# Patient Record
Sex: Male | Born: 1940 | Race: Asian | Hispanic: No | Marital: Married | Smoking: Former smoker
Health system: Southern US, Community
[De-identification: ages and names within clinical notes are randomized; demographics above are authoritative.]

## PROBLEM LIST (undated history)

## (undated) DIAGNOSIS — I447 Left bundle-branch block, unspecified: Secondary | ICD-10-CM

## (undated) DIAGNOSIS — I209 Angina pectoris, unspecified: Secondary | ICD-10-CM

## (undated) DIAGNOSIS — I10 Essential (primary) hypertension: Secondary | ICD-10-CM

## (undated) DIAGNOSIS — R0602 Shortness of breath: Secondary | ICD-10-CM

## (undated) DIAGNOSIS — Z95810 Presence of automatic (implantable) cardiac defibrillator: Secondary | ICD-10-CM

## (undated) DIAGNOSIS — I2109 ST elevation (STEMI) myocardial infarction involving other coronary artery of anterior wall: Secondary | ICD-10-CM

## (undated) DIAGNOSIS — I639 Cerebral infarction, unspecified: Secondary | ICD-10-CM

## (undated) DIAGNOSIS — G4730 Sleep apnea, unspecified: Secondary | ICD-10-CM

## (undated) DIAGNOSIS — Z950 Presence of cardiac pacemaker: Secondary | ICD-10-CM

## (undated) DIAGNOSIS — C801 Malignant (primary) neoplasm, unspecified: Secondary | ICD-10-CM

## (undated) DIAGNOSIS — J449 Chronic obstructive pulmonary disease, unspecified: Secondary | ICD-10-CM

## (undated) DIAGNOSIS — I255 Ischemic cardiomyopathy: Secondary | ICD-10-CM

## (undated) DIAGNOSIS — Z66 Do not resuscitate: Secondary | ICD-10-CM

## (undated) DIAGNOSIS — R51 Headache: Secondary | ICD-10-CM

## (undated) HISTORY — PX: INSERT / REPLACE / REMOVE PACEMAKER: SUR710

---

## 1998-02-10 ENCOUNTER — Encounter: Admission: RE | Admit: 1998-02-10 | Discharge: 1998-02-10 | Payer: Self-pay | Admitting: Hematology and Oncology

## 1998-03-08 ENCOUNTER — Encounter: Admission: RE | Admit: 1998-03-08 | Discharge: 1998-03-08 | Payer: Self-pay | Admitting: Internal Medicine

## 1998-03-22 ENCOUNTER — Emergency Department (HOSPITAL_COMMUNITY): Admission: EM | Admit: 1998-03-22 | Discharge: 1998-03-22 | Payer: Self-pay | Admitting: Emergency Medicine

## 1998-03-23 ENCOUNTER — Encounter: Admission: RE | Admit: 1998-03-23 | Discharge: 1998-03-23 | Payer: Self-pay | Admitting: Internal Medicine

## 2001-05-15 ENCOUNTER — Inpatient Hospital Stay (HOSPITAL_COMMUNITY): Admission: EM | Admit: 2001-05-15 | Discharge: 2001-05-21 | Payer: Self-pay | Admitting: *Deleted

## 2001-05-18 ENCOUNTER — Encounter: Payer: Self-pay | Admitting: Internal Medicine

## 2001-05-26 ENCOUNTER — Encounter: Admission: RE | Admit: 2001-05-26 | Discharge: 2001-05-26 | Payer: Self-pay | Admitting: Family Medicine

## 2001-06-01 ENCOUNTER — Encounter: Admission: RE | Admit: 2001-06-01 | Discharge: 2001-06-01 | Payer: Self-pay | Admitting: Family Medicine

## 2001-06-05 ENCOUNTER — Encounter: Admission: RE | Admit: 2001-06-05 | Discharge: 2001-06-05 | Payer: Self-pay | Admitting: Family Medicine

## 2001-06-12 ENCOUNTER — Encounter: Admission: RE | Admit: 2001-06-12 | Discharge: 2001-06-12 | Payer: Self-pay | Admitting: Family Medicine

## 2003-07-15 ENCOUNTER — Observation Stay (HOSPITAL_COMMUNITY): Admission: EM | Admit: 2003-07-15 | Discharge: 2003-07-16 | Payer: Self-pay | Admitting: Emergency Medicine

## 2006-04-29 ENCOUNTER — Encounter: Payer: Self-pay | Admitting: Emergency Medicine

## 2006-04-29 ENCOUNTER — Inpatient Hospital Stay (HOSPITAL_COMMUNITY): Admission: RE | Admit: 2006-04-29 | Discharge: 2006-05-01 | Payer: Self-pay | Admitting: Neurology

## 2006-04-30 ENCOUNTER — Ambulatory Visit: Payer: Self-pay | Admitting: Physical Medicine & Rehabilitation

## 2006-05-01 ENCOUNTER — Inpatient Hospital Stay (HOSPITAL_COMMUNITY)
Admission: RE | Admit: 2006-05-01 | Discharge: 2006-05-08 | Payer: Self-pay | Admitting: Physical Medicine & Rehabilitation

## 2006-06-09 ENCOUNTER — Encounter
Admission: RE | Admit: 2006-06-09 | Discharge: 2006-09-07 | Payer: Self-pay | Admitting: Physical Medicine & Rehabilitation

## 2006-07-02 ENCOUNTER — Ambulatory Visit: Payer: Self-pay | Admitting: Internal Medicine

## 2006-07-02 LAB — CONVERTED CEMR LAB
Calcium: 9.3 mg/dL (ref 8.4–10.5)
Glucose, Bld: 102 mg/dL — ABNORMAL HIGH (ref 70–99)
Potassium: 4.2 meq/L (ref 3.5–5.3)

## 2006-08-12 ENCOUNTER — Encounter: Payer: Self-pay | Admitting: Licensed Clinical Social Worker

## 2006-09-25 ENCOUNTER — Encounter (INDEPENDENT_AMBULATORY_CARE_PROVIDER_SITE_OTHER): Payer: Self-pay | Admitting: *Deleted

## 2006-09-25 ENCOUNTER — Ambulatory Visit: Payer: Self-pay | Admitting: Internal Medicine

## 2006-09-25 DIAGNOSIS — G8190 Hemiplegia, unspecified affecting unspecified side: Secondary | ICD-10-CM | POA: Insufficient documentation

## 2006-09-25 DIAGNOSIS — R630 Anorexia: Secondary | ICD-10-CM

## 2006-09-25 DIAGNOSIS — E785 Hyperlipidemia, unspecified: Secondary | ICD-10-CM | POA: Insufficient documentation

## 2006-09-25 DIAGNOSIS — I679 Cerebrovascular disease, unspecified: Secondary | ICD-10-CM

## 2006-09-25 DIAGNOSIS — I10 Essential (primary) hypertension: Secondary | ICD-10-CM | POA: Insufficient documentation

## 2006-09-26 LAB — CONVERTED CEMR LAB
Albumin: 4.4 g/dL (ref 3.5–5.2)
BUN: 22 mg/dL (ref 6–23)
Chloride: 107 meq/L (ref 96–112)
Glucose, Bld: 109 mg/dL — ABNORMAL HIGH (ref 70–99)
Potassium: 4.2 meq/L (ref 3.5–5.3)
Sodium: 142 meq/L (ref 135–145)
Total Bilirubin: 0.3 mg/dL (ref 0.3–1.2)
Total Protein: 7.6 g/dL (ref 6.0–8.3)
Triglycerides: 207 mg/dL — ABNORMAL HIGH (ref <150)

## 2006-10-28 ENCOUNTER — Inpatient Hospital Stay (HOSPITAL_COMMUNITY): Admission: AD | Admit: 2006-10-28 | Discharge: 2006-10-30 | Payer: Self-pay | Admitting: Internal Medicine

## 2006-10-28 ENCOUNTER — Ambulatory Visit: Payer: Self-pay | Admitting: Cardiology

## 2006-10-28 ENCOUNTER — Ambulatory Visit: Payer: Self-pay | Admitting: Internal Medicine

## 2006-10-29 ENCOUNTER — Encounter: Payer: Self-pay | Admitting: Cardiology

## 2006-10-30 ENCOUNTER — Encounter (INDEPENDENT_AMBULATORY_CARE_PROVIDER_SITE_OTHER): Payer: Self-pay | Admitting: Ophthalmology

## 2006-10-30 DIAGNOSIS — R0789 Other chest pain: Secondary | ICD-10-CM | POA: Insufficient documentation

## 2007-07-23 DIAGNOSIS — I639 Cerebral infarction, unspecified: Secondary | ICD-10-CM

## 2007-07-23 HISTORY — DX: Cerebral infarction, unspecified: I63.9

## 2007-11-02 ENCOUNTER — Telehealth (INDEPENDENT_AMBULATORY_CARE_PROVIDER_SITE_OTHER): Payer: Self-pay | Admitting: *Deleted

## 2007-12-05 ENCOUNTER — Emergency Department (HOSPITAL_COMMUNITY): Admission: EM | Admit: 2007-12-05 | Discharge: 2007-12-05 | Payer: Self-pay | Admitting: Emergency Medicine

## 2009-07-22 DIAGNOSIS — I2109 ST elevation (STEMI) myocardial infarction involving other coronary artery of anterior wall: Secondary | ICD-10-CM

## 2009-07-22 HISTORY — DX: ST elevation (STEMI) myocardial infarction involving other coronary artery of anterior wall: I21.09

## 2010-02-12 ENCOUNTER — Inpatient Hospital Stay (HOSPITAL_COMMUNITY): Admission: EM | Admit: 2010-02-12 | Discharge: 2010-02-15 | Payer: Self-pay | Admitting: Emergency Medicine

## 2010-02-12 ENCOUNTER — Emergency Department (HOSPITAL_COMMUNITY): Admission: EM | Admit: 2010-02-12 | Discharge: 2010-02-12 | Payer: Self-pay | Admitting: Emergency Medicine

## 2010-02-12 HISTORY — PX: CORONARY ANGIOPLASTY WITH STENT PLACEMENT: SHX49

## 2010-02-13 ENCOUNTER — Encounter (INDEPENDENT_AMBULATORY_CARE_PROVIDER_SITE_OTHER): Payer: Self-pay | Admitting: Cardiovascular Disease

## 2010-03-22 DIAGNOSIS — I255 Ischemic cardiomyopathy: Secondary | ICD-10-CM

## 2010-03-22 HISTORY — DX: Ischemic cardiomyopathy: I25.5

## 2010-10-06 LAB — DIFFERENTIAL
Basophils Absolute: 0 10*3/uL (ref 0.0–0.1)
Basophils Absolute: 0 10*3/uL (ref 0.0–0.1)
Basophils Relative: 0 % (ref 0–1)
Eosinophils Absolute: 0.1 10*3/uL (ref 0.0–0.7)
Eosinophils Absolute: 0.1 10*3/uL (ref 0.0–0.7)
Eosinophils Relative: 2 % (ref 0–5)
Lymphocytes Relative: 28 % (ref 12–46)
Lymphs Abs: 1.5 10*3/uL (ref 0.7–4.0)
Monocytes Relative: 10 % (ref 3–12)
Monocytes Relative: 8 % (ref 3–12)
Neutrophils Relative %: 61 % (ref 43–77)
Neutrophils Relative %: 62 % (ref 43–77)

## 2010-10-06 LAB — COMPREHENSIVE METABOLIC PANEL
AST: 30 U/L (ref 0–37)
BUN: 17 mg/dL (ref 6–23)
CO2: 23 mEq/L (ref 19–32)
Calcium: 8.5 mg/dL (ref 8.4–10.5)
GFR calc Af Amer: >60 mL/min (ref >60)
Glucose, Bld: 82 mg/dL (ref 70–99)
Sodium: 136 mEq/L (ref 135–145)
Total Bilirubin: 0.5 mg/dL (ref 0.3–1.2)
Total Protein: 7.1 g/dL (ref 6.0–8.3)

## 2010-10-06 LAB — BRAIN NATRIURETIC PEPTIDE: Pro B Natriuretic peptide (BNP): 1155 pg/mL — ABNORMAL HIGH (ref 0.0–100.0)

## 2010-10-06 LAB — HEMOGLOBIN A1C: Mean Plasma Glucose: 114 mg/dL (ref <117)

## 2010-10-06 LAB — POCT I-STAT, CHEM 8
BUN: 21 mg/dL (ref 6–23)
Chloride: 109 mEq/L (ref 96–112)
Creatinine, Ser: 1.1 mg/dL (ref 0.4–1.5)
Glucose, Bld: 79 mg/dL (ref 70–99)
HCT: 36 % — ABNORMAL LOW (ref 39.0–52.0)
Sodium: 144 mEq/L (ref 135–145)
TCO2: 24 mmol/L (ref 0–100)

## 2010-10-06 LAB — CBC
HCT: 31.6 % — ABNORMAL LOW (ref 39.0–52.0)
HCT: 34 % — ABNORMAL LOW (ref 39.0–52.0)
HCT: 37.5 % — ABNORMAL LOW (ref 39.0–52.0)
Hemoglobin: 10.6 g/dL — ABNORMAL LOW (ref 13.0–17.0)
Hemoglobin: 11.4 g/dL — ABNORMAL LOW (ref 13.0–17.0)
Hemoglobin: 11.7 g/dL — ABNORMAL LOW (ref 13.0–17.0)
MCH: 29.7 pg (ref 26.0–34.0)
MCHC: 33 g/dL (ref 30.0–36.0)
MCHC: 33.6 g/dL (ref 30.0–36.0)
MCV: 89.2 fL (ref 78.0–100.0)
Platelets: 161 10*3/uL (ref 150–400)
Platelets: 169 10*3/uL (ref 150–400)
RBC: 4.03 MIL/uL — ABNORMAL LOW (ref 4.22–5.81)
RBC: 4.2 MIL/uL — ABNORMAL LOW (ref 4.22–5.81)
RDW: 14.3 % (ref 11.5–15.5)
RDW: 14.5 % (ref 11.5–15.5)
WBC: 4.4 10*3/uL (ref 4.0–10.5)
WBC: 5.6 10*3/uL (ref 4.0–10.5)
WBC: 6 10*3/uL (ref 4.0–10.5)
WBC: 7.3 10*3/uL (ref 4.0–10.5)

## 2010-10-06 LAB — MAGNESIUM: Magnesium: 1.9 mg/dL (ref 1.5–2.5)

## 2010-10-06 LAB — BASIC METABOLIC PANEL
BUN: 21 mg/dL (ref 6–23)
BUN: 30 mg/dL — ABNORMAL HIGH (ref 6–23)
CO2: 22 mEq/L (ref 19–32)
CO2: 25 mEq/L (ref 19–32)
Calcium: 9 mg/dL (ref 8.4–10.5)
Chloride: 103 mEq/L (ref 96–112)
Chloride: 108 mEq/L (ref 96–112)
Creatinine, Ser: 0.83 mg/dL (ref 0.4–1.5)
Creatinine, Ser: 1.07 mg/dL (ref 0.4–1.5)
Creatinine, Ser: 1.21 mg/dL (ref 0.4–1.5)
GFR calc Af Amer: >60 mL/min (ref >60)
GFR calc Af Amer: >60 mL/min (ref >60)
GFR calc non Af Amer: 58 mL/min — ABNORMAL LOW (ref >60)
GFR calc non Af Amer: >60 mL/min (ref >60)
Glucose, Bld: 83 mg/dL (ref 70–99)
Potassium: 3.6 mEq/L (ref 3.5–5.1)
Potassium: 3.9 mEq/L (ref 3.5–5.1)
Sodium: 140 mEq/L (ref 135–145)

## 2010-10-06 LAB — CARDIAC PANEL(CRET KIN+CKTOT+MB+TROPI)
Relative Index: 3.6 — ABNORMAL HIGH (ref 0.0–2.5)
Total CK: 195 U/L (ref 7–232)
Total CK: 201 U/L (ref 7–232)
Troponin I: 1.75 ng/mL (ref 0.00–0.06)
Troponin I: 2.13 ng/mL (ref 0.00–0.06)

## 2010-10-06 LAB — URINALYSIS, MICROSCOPIC ONLY
Bilirubin Urine: NEGATIVE
Glucose, UA: NEGATIVE mg/dL
Hgb urine dipstick: NEGATIVE
Ketones, ur: NEGATIVE mg/dL
Leukocytes, UA: NEGATIVE
pH: 6 (ref 5.0–8.0)

## 2010-10-06 LAB — LIPID PANEL
HDL: 57 mg/dL (ref >39)
Triglycerides: 80 mg/dL (ref <150)
VLDL: 16 mg/dL (ref 0–40)

## 2010-10-06 LAB — PROTIME-INR: Prothrombin Time: 15 seconds (ref 11.6–15.2)

## 2010-10-06 LAB — MRSA PCR SCREENING: MRSA by PCR: NEGATIVE

## 2010-12-07 NOTE — Discharge Summary (Signed)
NAMEBUFFORD, HELMS NO.:  [id number]   MEDICAL RECORD NO.:  [id number]          PATIENT TYPE:  INP   LOCATION:  2038                         FACILITY:  [facility]   PHYSICIAN:  Antony Contras, M.D.     DATE OF BIRTH:  October 01, 1940   DATE OF ADMISSION:  10/28/2006  DATE OF DISCHARGE:  10/30/2006                               DISCHARGE SUMMARY   DISCHARGE DIAGNOSES:  1. Noncardiac chest pain, status post 2D echocardiogram and Myoview.  2. Dyspnea on exertion, questioned if related to chronic obstructive      pulmonary disease.  3. Hypertension.  4. Chronic tobacco abuse.  5. Dyslipidemia.  6. 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   DISCHARGE MEDICATIONS:  1. Zocor 40 mg p.o. daily.  2. Coreg 6.25 mg p.o. b.i.d.  3. Prinivil 20 mg p.o. daily.  4. Aspirin 325 mg p.o. daily.   DISPOSITION AND FOLLOWUP:  Mr. Linhardt is being discharged from [facility] in stable condition.  His chest pain has resolved at this  point.  He is to followup with his primary care physician in outpatient  clinic; Ysidro Evert is making this appointment for him as I am doing this  dictation.  At approximately 5 weeks the patient should have a repeat  fasting lipid panel performed as well as LFTs, as his dose of his statin  was increased 2 days ago during this hospitalization.  Also for his  primary care physician to consider adding possibly long acting beta-  agonist or anticholinergic for probable COPD from chronic tobacco abuse.   PROCEDURES PERFORMED:  Chest x-ray revealed borderline cardiomegaly with  emphysema.  No infiltrates.  There is a right upper lobe granuloma.  There was pleural parenchymal changes in the left medial apex suggestive  of scarring.  Recommend followup x-ray in 6-12 weeks to ensure that  these remain stable.   2D echocardiogram revealed overall normal left ventricular systolic  function with an EF estimated to be 60%.  Normal aortic valve and normal  mitral  valve.   Adenosine Myoview revealed no fixed or reversible defects.  Normal left  ventricular wall motion.  EF was estimated to be approximately 51%.   CONSULTATIONS:  Dr. Olga Millers, Cardiology.   BRIEF ADMISSION HISTORY AND PHYSICAL:  Mr. Valent is a 70 year old man with  a past medical history of hypertension, hyperlipidemia, chronic tobacco  abuse and an old right sided hemorrhagic stroke with some residual left  sided weakness and neglect who presented to outpatient clinic for  worsening dyspnea on exertion associated with left sided chest pain.  Apparently his shortness of breath has been present for at least 1 year  but has grown progressively worse over the prior month.  He denies any  cough, lower extremity edema, or fever.  The chest pain he reports as  left sided, lasts 10-20 minutes and occurs regardless of rest or  exertion.  He has had chest pain episodically over the prior 3-4 months  but it has been increasing in frequency over the last week.  It is now  occurring approximately 2x each day.  He has had no associated  diaphoresis, nausea, palpitations with his chest pain.  He has no prior  history of coronary artery disease or MI in the past.   ALLERGIES:  No known drug allergies.   PAST MEDICAL HISTORY:  Please see above.   SUBSTANCE HISTORY:  He is a former smoker but he upwards 1-2 packs per  day for 30 years.  He immigrated here in [place] 2002 and formally was  employed at [employer].  He is currently retired.   PHYSICAL EXAMINATION:  VITAL SIGNS:  Temperature 97.2.  Blood pressure  161/81.  Pulse 84.  Respiratory rate of 18.  O2 sats 100% on room air.  GENERAL:  This is a normal appearing man in no apparent distress.  EYES:  He has a right coronary abrasion at 9 o'clock.  His pupils are  equal round and reactive to light.  Extraocular muscles are intact.  Oropharynx reveals very poor dentition.  NECK:  Supple with no JVD.  LUNGS:  Clear to auscultation  bilaterally with no wheezes, rhonchi or  rales.  CARDIOVASCULAR:  Regular rate and rhythm.  No murmurs, rubs or gallops.  ABDOMEN:  Soft, nontender, nondistended.  Normoactive bowel sounds.  EXTREMITIES:  He had no cyanosis or edema but he did have clubbing.  No  appreciated lymphadenopathy.  MUSCULOSKELETAL:  Was normal with no joint abnormalities, effusion or  edema.  NEURO:  Alert and oriented x3.  5/5 strength in bilateral upper and  lower extremities with normal reflex and normal sensation on cerebellar  function throughout.  PSYCH EXAM:  Was appropriate.   LABS:  Sodium of 140.  Potassium 4.4.  Chloride 107.  Bicarb 27.  BUN  21.  Creatinine 0.94.  Glucose of 87.  PTT 12.3.  INR 0.9.  White blood  cell count 17.3.  Hemoglobin 13.1.  Platelets at 248.  Fasting lipid  panel from clinical revealed a total cholesterol of 125, triglycerides  at 78, HDL 33, LDL at 156.  A BNP was 169.  Normal LFTs.  A calcium of  9.6.  A mag of 2.3.   HOSPITAL COURSE:  1. Chest pain.  Given the progressive nature of the patient's chest      pain as well as his dyspnea on exertion as well as his risk factors      of his age, his sex, hypertension, dyslipidemia and his chronic      tobacco abuse the patient was admitted for concern for possible      unstable angina.  An EKG obtained on admission revealed normal      sinus rhythm.  A questionable left anterior fascicular block as      well as what was interrupted as a septal infarct given Q waves in      V1, V2.  There were no ST or T changes concerning for acute      ischemia.  The patient had cardiac enzymes cycled x3 which were      normal.  A 2D echocardiogram with results as described above.      Cardiology consultation was asked for given the patient's risk      factors.  They felt that is was appropriate to have the patient      undergo adenosine Myoview with results as described above.  At this     point the patient has no evidence of active or  old infarct on  Myoview.  Cardiology is recommending management of his risk factors      of hypertension, hyperlipidemia, etc. as an outpatient.  We will be      more than happy to do so in outpatient clinic.   1. Elevated d-dimer.  On the third day of admission a d-dimer was      ordered by cardiology.  This came back elevated at 1.05.  However,      the primary team went through and calculated his modified Well's      score which was 0 or less than 2 appropriately placing him in the      lower probably or unlikely.  We did not feel obligated to obtain      a high resolution chest CT given the very low pretest probability      and extra cost and resources required for this test.  His O2 sats      were normal throughout; he was not tachycardic; his chest pain had      resolved when the D-dimer was obtained.   1. Dyslipidemia.  Please see the fasting lipid panel as described      above.  Patient's Statin dose was increased.  In approximately 5      weeks he should have repeat fasting lipid panel as well as LFTs      checked as an outpatient.   1. Hypertension.  Patient has notably not been taking his      antihypertensives for at least 3-4 days prior to admission.  His      blood pressure was elevated on admission.  However, upon starting      his blood pressure medications his blood pressure became      controlled.  We have made no changes to his BP meds and we will      follow this as an outpatient.   1. Chronic tobacco abuse and question of COPD.  Given the patient's      changes on chest x-ray consistent with emphysema and has dyspnea on      exertion I wonder if his initial shortness of breath was likely      related to chronic tobacco abuse.  Regardless, I will defer to his      primary care physician in terms of starting a long acting beta      alginate or anticholinergic.   DISCHARGE LABS AND VITALS ON DAY OF DISCHARGE:  Temperature is 98.3.  Blood pressure 123/77.   Pulse of 62.  Respiratory rate of 18.  O2 sats  were 99% on room air.  Labs on October 29, 2006 revealed a white count of  7.2.  Hemoglobin 13.1.  Platelets 248.  Sodium 140.  Potassium 4.4.  Chloride 107.  Bicarb 27.  BUN 21.  Creatinine 0.94 and a glucose of 87.  Coags were normal.  All cardiac enzymes were negative.  For further  details please see eChart.      Antony Contras, M.D.  Electronically Signed     GL/MEDQ  D:  10/30/2006  T:  10/31/2006  Job:  [id number]   cc:   Madolyn Frieze. Jens Som, MD, [facility]
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
## 2010-12-07 NOTE — Discharge Summary (Signed)
[place]  Patient:    [name], [name] Visit Number: [id number] MRN: [id number]          Service Type: MED Location: [phone] Attending Physician:  Phifer, [name] Dictated by:   [name], M.D. Admit Date:  05/15/2001 Discharge Date: 05/21/2001                             Discharge Summary  DATE OF BIRTH:  May 18, 1941.  PROCEDURES:  MRI/MRA of brain.  CONSULTS:  Neurology.  DISCHARGE DIAGNOSES: 1. Syncope. 2. Hypertension.  DISCHARGE MEDICATIONS: 1. Lotensin 10 mg one tablet p.o. q.d. 2. Meclozine 25 mg one tablet q.6h. as needed for dizziness. 3. Tylenol 325 two tablets q.4h. p.r.n. headache or fever. 4. Aspirin 325 mg one tablet p.o. q.d. 5. Plavix 75 mg one tablet p.o. q.d.  FOLLOW-UP APPOINTMENT:  The patient has a follow-up appointment on May 26, 2001, at 10 a.m. at the [facility] with [name], M.D.  HISTORY OF PRESENT ILLNESS:  [name] is a 70 year old [place] male with a past medical history significant for hypertension, presenting secondary to a two-day history of syncopal episodes.  On presentation, the patient complained of frequent dizziness and headaches with episodes of syncope on the day of presentation.  No seizure activity noted, no visual changes, no sweating, no chest pain, no shortness of breath, positive nausea, no vomiting.  The patient was admitted for syncopal work-up.  EKG on presentation was normal sinus rhythm with normal PR and QRS intervals, no Q-waves or ST changes appreciated. A chest x-ray showed hyperinflation but no acute disease state.  CT of head without contrast showed questionable small vessel disease and ischemic changes of the left frontal white matter; however, no infarct or tumors appreciated. Cardiac enzymes were negative.  The patient was ruled out for MI.  The patient was placed on telemetry throughout his admission which demonstrated  normal sinus rhythm or sinus bradycardia throughout his hospitalization.  Vitamin B12 level was within normal limits.  Cholesterol panel was within normal limits. Electrolytes and hemoglobin were all within normal limits during admission. TSH was within normal limits.  Echocardiogram performed revealed mildly decreased left ventricular function with an EF 40% and hypokinesis of the inferior posterior wall.  Increased left ventricular wall thickness and right ventricle was mildly dilated.  Peak right ventricular systolic pressure was 25 mmHg.  Right atrium was mildly to moderately dilated.  An MRA and MRI performed during admission revealed mild atrophy with white matter changes. There was altered signal intensity consistent with mineral deposition or sometimes seen with carbon monoxide and toxicity.  Also noted was narrowing and irregularity diffusely of the circle of Willis.  Sed rate was within normal limits at 23.  The patient was not orthostatic throughout his admission.  The patient was discharged on his normal hypertensive medication and initiated on Meclozine therapy.  Neurology consult was obtained during admission.  After evaluation of MRI/MRA and lab results, neurology recommended initiating aspirin and Plavix therapy. May also consider an EEG though they doubted any seizure activity associated with this.  The patient was discharged on those medications.  DISCHARGE PHYSICAL EXAMINATION:  GENERAL APPEARANCE:  Well-developed, well-nourished male in no acute distress. Polite and interactive.  HEENT:  Pupils are equal, round and reactive to light.  Extraocular muscles are intact.  Nares are patent without discharge.  Moist mucous membranes.  NECK:  Supple without lymphadenopathy, no thyromegaly.  CARDIOVASCULAR:  Regular rate and rhythm with no murmurs, rubs, or gallops appreciated.  No bruits appreciated.  No JVD appreciated.  ABDOMEN:  Thin, soft, nontender and  nondistended, positive bowel sounds, normoactive.  No hepatosplenomegaly appreciated.  No bruits appreciated.  EXTREMITIES:  No clubbing, cyanosis, or edema.  No rashes.  There are 2+ dorsalis and radial pulses.  NEUROLOGICAL:  Pupils are equal, round and reactive to light.  Extraocular muscles were intact.  Strength is 5/5 x 4 extremities.  Good range of motion of all extremities.  Normal gait.  Romberg was negative.  Sensation was intact.  Mental status:  The patient was alert and oriented x 4.  DISCHARGE LABS:  EKG showed normal sinus rhythm with normal PR and QRS intervals.  No Q-waves or ST changes noted.  Chest x-ray revealed hyperinflation with no acute disease process.  CT of head showed small vessel disease with ischemic changes of left frontal deep white matter.  No infarct or hemorrhage.  CK 250, MB 4.3, troponin I 0.01, index 1.7.  Cholesterol 209, triglycerides 111, HDL 51, LDL 136.  B12 737. TSH 1.940.  ESR 23. RPR nonreactive.  Echocardiogram showed mildly decreased left ventricular function, ejection fraction 40%, hypokinesis of inferior posterior wall, increased left ventricular wall thickness, right ventricle mildly dilated, peak right ventricular systolic pressure 25, right atrium moderately dilated.  MRA/MRI of the brain revealed mild atrophy and white matter changes, altered signal intensity questioning possible mineral deposition or carbon monoxide exposure, narrowing in irregularity diffusely of circle of Willis.  DISCHARGE RECOMMENDATIONS: 1. Activity:  No restrictions. 2. Diet:  Recommend a low salt diet. 3. Wound care:  Not applicable. 4. Special instructions:  Recommend the patient call the doctor for persistent    dizziness or headaches. Dictated by:   [name], M.D. Attending Physician:  Phifer, [name] DD:  05/21/01 TD:  05/22/01 Job: [id number]
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
## 2010-12-07 NOTE — H&P (Signed)
[name], LILL NO.:  [id number]   MEDICAL RECORD NO.:  [id number]                   PATIENT TYPE:  INP   LOCATION:  1826                                 FACILITY:  [facility]   PHYSICIAN:  Hettie Holstein, D.O.                 DATE OF BIRTH:  July 21, 1941   DATE OF ADMISSION:  07/14/2003  DATE OF DISCHARGE:                                HISTORY & PHYSICAL   PRIMARY CARE PHYSICIAN:  The patient had been scheduled to follow up at the  [facility] here at [facility] three years ago; however, I am not  sure if he did do this.   History is limited secondary to the patient's inability to speak [place].   CHIEF COMPLAINT:  Dizziness.   HISTORY OF PRESENTING ILLNESS:  This is a 70 year old [place] male who  speaks very little English who had been complaining of a couple of days of  dizziness and headache and accompanying nausea.  He had a prior history of  hypertension.  He also presented with a similar presentation back in 2002.  However, he was evaluated thoroughly for syncope at that time.  He had been  discharged on antihypertensive medications which he is not taking at this  time.  He denies any medications and states that he does not follow up with  any physicians.  He reports to increasing salt intake with regard to his  foods and eating lots of fried fish foods.  He otherwise denied any other  complaints with the exception of dizziness.  No known history of strokes or  heart problems as per the patient's family, who do speak some [place].  However, they are not completely clear on his medical history.  In the  emergency department here at [facility] a CT of his head revealed nothing  acute.  Chest x-ray revealed no active disease.  His EKG revealed only sinus  bradycardia.  He was given clonidine without success for control and  subsequently labetalol to which his blood pressure continued to be  refractory and he continued to have  symptoms.   PAST MEDICAL HISTORY:  1. Hypertension, poor compliance.  2. History of syncope in the past.  Underwent thorough evaluation by the     medicine teaching service at which time he underwent MRI of his brain     that revealed only several areas of narrowing and irregularity in a     somewhat of a diffuse fashion but most notable along branch vessels, and     possible vasculitis was in the differential at that time.  There was some     moderate nonspecific white matter-type changes on that study.  In any     event, he was supposed to follow up, and I do not see in the records that     he did follow up.  At that time they also performed a 2-D echocardiogram     which revealed overall left ventricular systolic function to be mildly     decreased with an LV ejection fraction of 40-50% with findings suggestive     of mild hypokinesis of the inferoposterior wall.  LV wall thickness was     mildly increased.  The RV was mildly dilated.  Estimated peak RV pressure     was 25 mm, and the right atrium was mildly to moderately dilated as well.     This was performed on May 18, 2001.  As well, he underwent MRI as     discussed already.  3. Other history is limited.   He denies any medications at home.   Denies any drug allergies.   FAMILY HISTORY:  Noncontributory.   SOCIAL HISTORY:  Significant for a longstanding smoking history ever since  childhood, approximately one-half to one pack a day and occasional alcohol.  He works at a [employer], and he has several children who are  attending with him today.   REVIEW OF SYSTEMS:  The patient only reported the dizziness, the nausea, and  the headache.  No chest pain or reported shortness of breath.  No diarrhea.  No swelling in his legs.   PHYSICAL EXAMINATION:  VITAL SIGNS:  Blood pressure on presentation 224/117,  subsequent blood pressure upon my assessment 195/103, heart rate in the 50s-  60s, O2 saturation 97% on  room air, temperature 97.3, respirations 16.  GENERAL:  The patient is arousable, alert.  He does follow commands though  he is not completely cooperative.  He appears to be quite uncomfortable and  appears to be closing his eyes, trying to tune this interviewer out.  HEENT:  Some cataracts about the right eye as well as the left.  Could not  appreciate papilledema on the funduscopic exam.  NECK:  Supple.  Nontender.  HEART:  Normal S1, S2.  LUNGS:  Clear bilaterally.  ABDOMEN:  Soft.  No palpable hepatosplenomegaly.  EXTREMITIES:  Lower extremities reveal no edema.  Peripheral pulses were  palpable.  Strength was +4/5 bilaterally with respect to the proximal  muscles and the upper extremities as well.  There were no focal neurologic  deficits on my examination.   LABORATORY DATA:  WBC of 4.7, hemoglobin 12.4, hematocrit 36.7, MCV 87,  platelet count of 272.  INR 0.8.  Sodium 142, potassium 4, chloride 106, CO2  31, glucose 100, BUN 18, creatinine 1, calcium 8.9.  Total protein 7.1,  albumin 3.5, AST and ALT 27 and 19 respectively, alkaline phosphatase 63,  bilirubin total 0.3.  CK was elevated at 413.  Relative index was not  significant, magnesium was 2.1.  Troponin I was 0.01.   As noted above I reported the findings of the CT as well as the EKG.  Please  refer to the body above.   IMPRESSION AND PLAN:  1. Hypertension, poorly controlled.  2. Dizziness and headache attributable to #1.  3. Noncompliance.  4. History of abnormal imaging studies of the brain that warrant further     neurologic follow-up either as an outpatient or inpatient.  It must be     emphasized to the patient that he maintain these important appointments.  5. We are going to admit Mr. Nelis to a telemetry floor.  Administer IV     medications for blood pressure control.  Will start him on medications     that he  should be able to take at home including Lopressor and    hydrochlorothiazide.  Will give p.r.n.  hydralazine as his heart rate has     been dropping into the 50s here with labetalol.  If he appears to be     ambulatory and back to his baseline in the morning, it would be     reasonable to discharge home with close follow-up at the primary care     physician.                                                Hettie Holstein, D.O.    ESS/MEDQ  D:  07/15/2003  T:  07/16/2003  Job:  [id number]

## 2010-12-07 NOTE — H&P (Signed)
NAMEBRISTON, LAX                   ACCOUNT NO.:  [id number]   MEDICAL RECORD NO.:  [id number]          PATIENT TYPE:  INP   LOCATION:  3105                         FACILITY:  [facility]   PHYSICIAN:  Casimiro Needle L. Reynolds, M.D.DATE OF BIRTH:  [birth year]   DATE OF ADMISSION:  04/29/2006  DATE OF DISCHARGE:                                HISTORY & PHYSICAL   CHIEF COMPLAINT:  Unable to walk.   HPI:  This is the third [facility] admission for this 70 -year-old  male with past medical history which includes untreated hypertension.  The  patient said that he awoke on the morning of April 27, 2006 and when he  woke up, he was unable to walk right.  He felt that both arms and both  legs were weak.  He also complained of a headache and feeling dizzy.  He  did not seek medical attention for a couple of days thinking that this was  going to get better.  However, after this did not improve over a couple of  days, he came to the emergency room today for evaluation.  He was found, at  that time, to have a significantly elevated blood pressure and on  examination he demonstrated left-sided weakness.  CT of the head was  performed and demonstrated a right thalamic hemorrhage and he was  subsequently admitted for further management.  The patient, at this time, is  complaining of a headache on the right side of his head, as well as a  feeling of dizzy.  He also complains of little bit of low back pain.  He  says he has never had anything like this weakness or difficulty walking  before.   PAST MEDICAL HISTORY:  Remarkable for hypertension, which was diagnosed a  few years ago according to his old charts at [facility], before which he says he  is on no medications.  He denies any other known medical problems.  He was  hospitalized in 2002 and again in 2004 for headache and dizziness with  elevated blood pressure.   FAMILY HISTORY:  He denies any family history of hypertension or stroke.   SOCIAL  HISTORY:  He smokes a pack of cigarettes every 3 days and he consumes  alcohol mostly on the weekend, but not on a daily basis.  He works doing  Youth worker types of jobs.  He is able to speak a little bit of English,  but does not have a strong command of the language.   ALLERGIES:  NO KNOWN DRUG ALLERGIES.   MEDICATIONS:  No medications on a regular basis.   REVIEW OF SYSTEMS:  Remarkable for headache and dizziness as above.  He also  says that from time to time he has low back pain.  Otherwise, a full 10  system review of systems is negative, except as outlined in the HPI and in  the admission nursing records.   PHYSICAL EXAMINATION:  VITAL SIGNS:  Temperature 98.7.  Blood pressure  182/96.  Pulse 77.  Respirations 18.  GENERAL EXAMINATION:  This is a  thin, somewhat cachectic appearing man,  supine in the hospital bed in no evident distress.  HEENT:  Cranial was normocephalic, atraumatic.  Oropharynx is benign.  NECK:  Supple without carotid or supraclavicular bruits.  HEART:  Regular rate and rhythm without murmurs  CHEST:  Clear to auscultation bilaterally.  ABDOMEN:  Soft.  No hepatosplenomegaly.  Normoactive bowel sounds.  EXTREMITIES:  Two pluses.  No edema.  NEUROLOGIC EXAM:  Mental status:  He is awake and alert.  He identifies the  month as October, but identifies that he is in a hospital.  He does follow  simple and complex commands pretty well with translation.  He is able to  name a few objects.  Cranial nerves:  Pupils are equal and reactive.  Extraocular movements full without nystagmus.  Visual fields are full to  confrontation.  He has a slight right facial droop, but facial sensation is  intact to pin prick and his tongue and palate move normally.  Motor:  Normal  bulk and tone.  He demonstrates mild drift to the left upper and lower  extremities and has a mild left hemiparesis on the formal strength testing.  Sensory:  Intact to pin prick in lower extremities,  but he reliably  extinguishes to double simultaneous stimulation on the left.  Coordination:  Rapid movements are performed adequately.  He has heel-to-shin ataxia on the  left, but performs finger-to-nose fairly well.  Gait is deferred.  Reflexes  are 2+ and symmetric.  Toe is up on the left and down on the right.  NIA  stroke scale score is 6, scoring 1 point each for level of consciousness  (mild), mild facial palsy, mild left arm and left leg drift, left heel-to-  shin ataxia and sensory extinction.   LABORATORY REVIEW:  CBC:  White count 5.3, hemoglobin 12.6, platelets  247,000.  BMET is unremarkable.  CT of the head is personally reviewed and  the study demonstrates an approximately 2 x 2 cm hemorrhage in the middle of  the right thalamus, which demonstrates a little bit of shift in the third  ventricle, but no hydrocephalus and no intraventricular blood.   IMPRESSION:  Right intercerebral hemorrhage secondary to uncontrolled  hypertension with resultant left hemiparesis.   PLAN:  Admit to the neuro ICU for observation.  For now, we will treat his  blood pressure issues with intravenous Cardene, gradually switching him over  to oral medications.  We will need a followup CT in the morning.  At that  point, we can hopefully taper off his Cardene, get him on oral medicines,  get him out of the ICU and start to mobilize him a little bit.  Stroke  service to follow.      Michael L. Thad Ranger, M.D.  Electronically Signed     MLR/MEDQ  D:  04/29/2006  T:  04/30/2006  Job:  [id number]
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
## 2010-12-07 NOTE — Discharge Summary (Signed)
[name], [name] NO.:  [id number]   MEDICAL RECORD NO.:  [id number]          PATIENT TYPE:  IPS   LOCATION:  4035                         FACILITY:  [facility]   PHYSICIAN:  Annie Main, N.P.      DATE OF BIRTH:  Jul 20, 1941   DATE OF ADMISSION:  05/01/2006  DATE OF DISCHARGE:  05/01/2006                                 DISCHARGE SUMMARY   DICTATED BY:  Annie Main, N.P.   DIAGNOSES AT TIME OF DISCHARGE:  1. Right basal ganglia intracerebral hemorrhage secondary to hypertension.  2. Hypertension.  3. Dyslipidemia.   MEDICINES AT TIME OF DISCHARGE:  1. Zocor 20 mg a day.  2. Lisinopril 20 mg a day.  3. Hydrochlorothiazide 25 mg a day.   STUDIES PERFORMED:  1. CT of the brain on admission shows a right thalamic hematoma,      surrounding vasogenic edema but no mass effect.  2. Followup CT at 24 hours, stable.  No increase in hemorrhage.  3. EKG shows a normal sinus rhythm with left anterior fascicular block,      septal infarct, age undetermined.  When compared with previous EKG,      hyperactive anterior T-waves less apparent than previously.  4. Carotid Doppler shows no ICA stenosis.   LABORATORY STUDIES:  Chemistry:  Normal.  CBC:  Normal.  LDL 141.   HISTORY OF PRESENT ILLNESS:  The patient is a 70 year old male with a past  medical history of untreated hypertension who woke unable to walk right  and both arms and legs were weak.  Also complained of headache and  dizziness.  He did not seek medical attention until the next day because he  thought that he would get better, but when no improvement came, he presented  to the emergency room where his blood pressure was high and a CT of the head  showed a right thalamic intracranial hemorrhage.  He was brought from [facility] for further management.   HOSPITAL COURSE:  CT of the head remained stable with his right thalamic  hemorrhage measuring 2 x 2 x 1 cm with no mass effect.  He was  initially  placed on carting but then was changed to p.o. and was able to get off the  IV drip.  He was evaluated by PT and OT and felt he would benefit from a  rehab stay.  Other vascular risk factors identified including dyslipidemia  with LDL 141, for which Zocor was started.  He had no other vascular risk  factors.  He was transferred to rehab.   CONDITION ON DISCHARGE:  The patient is alert and oriented x3.  No aphasia  or dysarthria.  He follows commands.  His eye movements are full.  His face  is symmetric.  He has no drift.  He has mild left hemiparesis 4/5, left grip  is weak with decreased fine motor movements on the left.  He has decreased  foot tap on the left.  His sensation is intact.  He has normal sensation.  DISCHARGE PLAN:  1. Transfer to rehab for continuation of PT/OT.  2. No antiplatelets at present.  Will consider aspirin at followup with      Dr. Pearlean Brownie.  3. Follow up with primary care physician for risk factor control with goal      LDL less than 100, goal systolic blood pressure less than 130 one month      after discharge from rehab.  4. Follow up with Dr. Delia Heady 2 months after discharge from rehab.      Annie Main, N.P.     SB/MEDQ  D:  05/06/2006  T:  05/07/2006  Job:  [id number]   cc:   Pramod P. Pearlean Brownie, MD

## 2011-04-17 LAB — POCT CARDIAC MARKERS
CKMB, poc: 1.7
Myoglobin, poc: 123
Troponin i, poc: <0.05

## 2011-04-17 LAB — POCT I-STAT, CHEM 8
Chloride: 104
Glucose, Bld: 76
HCT: 37 — ABNORMAL LOW
Potassium: 4
Sodium: 141

## 2011-09-24 ENCOUNTER — Ambulatory Visit
Admission: RE | Admit: 2011-09-24 | Discharge: 2011-09-24 | Disposition: A | Discharge status: Home / Self Care | Payer: No Typology Code available for payment source | Source: Ambulatory Visit | Attending: Cardiology | Admitting: Cardiology

## 2011-09-24 ENCOUNTER — Other Ambulatory Visit: Payer: Self-pay | Admitting: Cardiology

## 2012-03-13 ENCOUNTER — Ambulatory Visit: Payer: No Typology Code available for payment source

## 2012-03-13 ENCOUNTER — Emergency Department (HOSPITAL_COMMUNITY): Payer: No Typology Code available for payment source

## 2012-03-13 ENCOUNTER — Encounter (HOSPITAL_COMMUNITY): Payer: Self-pay | Admitting: *Deleted

## 2012-03-13 ENCOUNTER — Inpatient Hospital Stay (HOSPITAL_COMMUNITY)
Admission: EM | Admit: 2012-03-13 | Discharge: 2012-03-14 | DRG: 287 | Disposition: A | Discharge status: Home / Self Care | Payer: No Typology Code available for payment source | Attending: Internal Medicine | Admitting: Internal Medicine

## 2012-03-13 ENCOUNTER — Encounter (HOSPITAL_COMMUNITY)
Admission: EM | Disposition: A | Discharge status: Home / Self Care | Payer: Self-pay | Source: Home / Self Care | Attending: Internal Medicine

## 2012-03-13 ENCOUNTER — Ambulatory Visit (INDEPENDENT_AMBULATORY_CARE_PROVIDER_SITE_OTHER): Payer: No Typology Code available for payment source | Admitting: Family Medicine

## 2012-03-13 VITALS — BP 152/90 | HR 68 | Temp 97.7°F | Resp 18 | Ht 66.5 in | Wt 118.0 lb

## 2012-03-13 DIAGNOSIS — Z8673 Personal history of transient ischemic attack (TIA), and cerebral infarction without residual deficits: Secondary | ICD-10-CM

## 2012-03-13 DIAGNOSIS — Z87891 Personal history of nicotine dependence: Secondary | ICD-10-CM

## 2012-03-13 DIAGNOSIS — R64 Cachexia: Secondary | ICD-10-CM | POA: Diagnosis present

## 2012-03-13 DIAGNOSIS — Z91199 Patient's noncompliance with other medical treatment and regimen due to unspecified reason: Secondary | ICD-10-CM

## 2012-03-13 DIAGNOSIS — I255 Ischemic cardiomyopathy: Secondary | ICD-10-CM | POA: Diagnosis present

## 2012-03-13 DIAGNOSIS — R079 Chest pain, unspecified: Secondary | ICD-10-CM

## 2012-03-13 DIAGNOSIS — I10 Essential (primary) hypertension: Secondary | ICD-10-CM | POA: Diagnosis present

## 2012-03-13 DIAGNOSIS — I252 Old myocardial infarction: Secondary | ICD-10-CM

## 2012-03-13 DIAGNOSIS — I447 Left bundle-branch block, unspecified: Secondary | ICD-10-CM | POA: Diagnosis present

## 2012-03-13 DIAGNOSIS — I2589 Other forms of chronic ischemic heart disease: Secondary | ICD-10-CM | POA: Diagnosis present

## 2012-03-13 DIAGNOSIS — J449 Chronic obstructive pulmonary disease, unspecified: Secondary | ICD-10-CM | POA: Diagnosis present

## 2012-03-13 DIAGNOSIS — Z9119 Patient's noncompliance with other medical treatment and regimen: Secondary | ICD-10-CM

## 2012-03-13 DIAGNOSIS — Z9861 Coronary angioplasty status: Secondary | ICD-10-CM

## 2012-03-13 DIAGNOSIS — IMO0002 Reserved for concepts with insufficient information to code with codable children: Secondary | ICD-10-CM

## 2012-03-13 DIAGNOSIS — I200 Unstable angina: Secondary | ICD-10-CM | POA: Diagnosis present

## 2012-03-13 DIAGNOSIS — J4489 Other specified chronic obstructive pulmonary disease: Secondary | ICD-10-CM | POA: Diagnosis present

## 2012-03-13 DIAGNOSIS — I2510 Atherosclerotic heart disease of native coronary artery without angina pectoris: Principal | ICD-10-CM | POA: Diagnosis present

## 2012-03-13 HISTORY — DX: Essential (primary) hypertension: I10

## 2012-03-13 HISTORY — DX: Cerebral infarction, unspecified: I63.9

## 2012-03-13 HISTORY — DX: Chronic obstructive pulmonary disease, unspecified: J44.9

## 2012-03-13 HISTORY — PX: LEFT HEART CATHETERIZATION WITH CORONARY ANGIOGRAM: SHX5451

## 2012-03-13 HISTORY — DX: Left bundle-branch block, unspecified: I44.7

## 2012-03-13 HISTORY — DX: Ischemic cardiomyopathy: I25.5

## 2012-03-13 LAB — POCT I-STAT, CHEM 8
BUN: 23 mg/dL (ref 6–23)
Chloride: 105 mEq/L (ref 96–112)
Potassium: 5 mEq/L (ref 3.5–5.1)
Sodium: 140 mEq/L (ref 135–145)
TCO2: 26 mmol/L (ref 0–100)

## 2012-03-13 LAB — TROPONIN I
Troponin I: <0.3 ng/mL (ref <0.30)
Troponin I: <0.3 ng/mL (ref <0.30)

## 2012-03-13 LAB — CBC WITH DIFFERENTIAL/PLATELET
Basophils Absolute: 0 10*3/uL (ref 0.0–0.1)
Basophils Relative: 1 % (ref 0–1)
Eosinophils Absolute: 0.1 10*3/uL (ref 0.0–0.7)
Hemoglobin: 11.3 g/dL — ABNORMAL LOW (ref 13.0–17.0)
MCHC: 32.8 g/dL (ref 30.0–36.0)
Neutro Abs: 2.8 10*3/uL (ref 1.7–7.7)
Neutrophils Relative %: 63 % (ref 43–77)
Platelets: 202 10*3/uL (ref 150–400)
RDW: 13.8 % (ref 11.5–15.5)

## 2012-03-13 LAB — POCT I-STAT TROPONIN I: Troponin i, poc: 0 ng/mL (ref 0.00–0.08)

## 2012-03-13 LAB — PRO B NATRIURETIC PEPTIDE: Pro B Natriuretic peptide (BNP): 2148 pg/mL — ABNORMAL HIGH (ref 0–125)

## 2012-03-13 LAB — BASIC METABOLIC PANEL
Chloride: 104 mEq/L (ref 96–112)
GFR calc Af Amer: 80 mL/min — ABNORMAL LOW (ref >90)
GFR calc non Af Amer: 69 mL/min — ABNORMAL LOW (ref >90)
Potassium: 4.9 mEq/L (ref 3.5–5.1)
Sodium: 140 mEq/L (ref 135–145)

## 2012-03-13 LAB — PROTIME-INR
INR: 1.01 (ref 0.00–1.49)
Prothrombin Time: 13.5 seconds (ref 11.6–15.2)

## 2012-03-13 LAB — TSH: TSH: 1.363 u[IU]/mL (ref 0.350–4.500)

## 2012-03-13 SURGERY — LEFT HEART CATHETERIZATION WITH CORONARY ANGIOGRAM
Anesthesia: LOCAL

## 2012-03-13 MED ORDER — ASPIRIN 81 MG PO CHEW: 324.0000 mg | CHEWABLE_TABLET | ORAL | Status: DC

## 2012-03-13 MED ORDER — ASPIRIN 300 MG RE SUPP
300.0000 mg | RECTAL | Status: DC
  Filled 2012-03-13: qty 1

## 2012-03-13 MED ORDER — CLOPIDOGREL BISULFATE 75 MG PO TABS
75.0000 mg | ORAL_TABLET | Freq: Every day | ORAL | Status: DC
  Administered 2012-03-14: 75 mg via ORAL
  Filled 2012-03-13: qty 1

## 2012-03-13 MED ORDER — SODIUM CHLORIDE 0.9 % IV SOLN: 250.0000 mL | INTRAVENOUS | Status: DC | PRN

## 2012-03-13 MED ORDER — SODIUM CHLORIDE 0.9 % IJ SOLN: 3.0000 mL | INTRAMUSCULAR | Status: DC | PRN

## 2012-03-13 MED ORDER — ZOLPIDEM TARTRATE 5 MG PO TABS: 5.0000 mg | ORAL_TABLET | Freq: Every evening | ORAL | Status: DC | PRN

## 2012-03-13 MED ORDER — ALPRAZOLAM 0.25 MG PO TABS
0.2500 mg | ORAL_TABLET | Freq: Three times a day (TID) | ORAL | Status: DC | PRN
  Administered 2012-03-13: 0.25 mg via ORAL
  Filled 2012-03-13: qty 1

## 2012-03-13 MED ORDER — ASPIRIN 81 MG PO CHEW: 81.0000 mg | CHEWABLE_TABLET | Freq: Every day | ORAL | Status: DC

## 2012-03-13 MED ORDER — SIMVASTATIN 40 MG PO TABS
40.0000 mg | ORAL_TABLET | Freq: Every day | ORAL | Status: DC
  Administered 2012-03-13: 40 mg via ORAL
  Filled 2012-03-13 (×2): qty 1

## 2012-03-13 MED ORDER — DIAZEPAM 5 MG PO TABS: 5.0000 mg | ORAL_TABLET | ORAL | Status: DC

## 2012-03-13 MED ORDER — RAMIPRIL 10 MG PO CAPS
10.0000 mg | ORAL_CAPSULE | Freq: Every day | ORAL | Status: DC
  Administered 2012-03-13 – 2012-03-14 (×2): 10 mg via ORAL
  Filled 2012-03-13 (×2): qty 1

## 2012-03-13 MED ORDER — SODIUM CHLORIDE 0.9 % IV SOLN
INTRAVENOUS | Status: DC
  Administered 2012-03-13: 19:00:00 via INTRAVENOUS

## 2012-03-13 MED ORDER — HEPARIN (PORCINE) IN NACL 2-0.9 UNIT/ML-% IJ SOLN
INTRAMUSCULAR | Status: AC
  Filled 2012-03-13: qty 1000

## 2012-03-13 MED ORDER — ASPIRIN 81 MG PO CHEW
324.0000 mg | CHEWABLE_TABLET | ORAL | Status: DC
  Filled 2012-03-13: qty 4

## 2012-03-13 MED ORDER — METOPROLOL TARTRATE 12.5 MG HALF TABLET
12.5000 mg | ORAL_TABLET | Freq: Two times a day (BID) | ORAL | Status: DC
  Administered 2012-03-13 – 2012-03-14 (×2): 12.5 mg via ORAL
  Filled 2012-03-13 (×3): qty 1

## 2012-03-13 MED ORDER — SODIUM CHLORIDE 0.9 % IJ SOLN: 3.0000 mL | Freq: Two times a day (BID) | INTRAMUSCULAR | Status: DC

## 2012-03-13 MED ORDER — SODIUM CHLORIDE 0.9 % IV SOLN
INTRAVENOUS | Status: DC
  Administered 2012-03-13: 14:00:00 via INTRAVENOUS

## 2012-03-13 MED ORDER — VERAPAMIL HCL 2.5 MG/ML IV SOLN
INTRAVENOUS | Status: AC
  Filled 2012-03-13: qty 2

## 2012-03-13 MED ORDER — ONDANSETRON HCL 4 MG/2ML IJ SOLN: 4.0000 mg | Freq: Four times a day (QID) | INTRAMUSCULAR | Status: DC | PRN

## 2012-03-13 MED ORDER — HEPARIN BOLUS VIA INFUSION: 4000.0000 [IU] | Freq: Once | INTRAVENOUS | Status: DC

## 2012-03-13 MED ORDER — ASPIRIN EC 81 MG PO TBEC
81.0000 mg | DELAYED_RELEASE_TABLET | Freq: Every day | ORAL | Status: DC
  Administered 2012-03-14: 81 mg via ORAL
  Filled 2012-03-13: qty 1

## 2012-03-13 MED ORDER — ACETAMINOPHEN 325 MG PO TABS: 650.0000 mg | ORAL_TABLET | ORAL | Status: DC | PRN

## 2012-03-13 MED ORDER — HEPARIN (PORCINE) IN NACL 100-0.45 UNIT/ML-% IJ SOLN
650.0000 [IU]/h | INTRAMUSCULAR | Status: DC
  Filled 2012-03-13: qty 250

## 2012-03-13 MED ORDER — NITROGLYCERIN 0.4 MG SL SUBL
0.4000 mg | SUBLINGUAL_TABLET | SUBLINGUAL | Status: DC | PRN
  Administered 2012-03-13: 0.4 mg via SUBLINGUAL

## 2012-03-13 MED ORDER — FENTANYL CITRATE 0.05 MG/ML IJ SOLN
INTRAMUSCULAR | Status: AC
  Filled 2012-03-13: qty 2

## 2012-03-13 MED ORDER — MIDAZOLAM HCL 2 MG/2ML IJ SOLN
INTRAMUSCULAR | Status: AC
  Filled 2012-03-13: qty 2

## 2012-03-13 MED ORDER — ACETAMINOPHEN 325 MG PO TABS
650.0000 mg | ORAL_TABLET | ORAL | Status: DC | PRN
  Administered 2012-03-14: 650 mg via ORAL
  Filled 2012-03-13: qty 2

## 2012-03-13 MED ORDER — NITROGLYCERIN 0.4 MG SL SUBL: 0.4000 mg | SUBLINGUAL_TABLET | SUBLINGUAL | Status: DC | PRN

## 2012-03-13 MED ORDER — NITROGLYCERIN IN D5W 200-5 MCG/ML-% IV SOLN
3.0000 ug/min | INTRAVENOUS | Status: DC
  Administered 2012-03-13: 3 ug/min via INTRAVENOUS
  Administered 2012-03-13: 10 ug/min via INTRAVENOUS
  Filled 2012-03-13: qty 250

## 2012-03-13 MED ORDER — HEPARIN SODIUM (PORCINE) 1000 UNIT/ML IJ SOLN
INTRAMUSCULAR | Status: AC
  Filled 2012-03-13: qty 1

## 2012-03-13 MED ORDER — NITROGLYCERIN 0.2 MG/ML ON CALL CATH LAB
INTRAVENOUS | Status: AC
  Filled 2012-03-13: qty 1

## 2012-03-13 MED ORDER — LIDOCAINE HCL (PF) 1 % IJ SOLN
INTRAMUSCULAR | Status: AC
  Filled 2012-03-13: qty 30

## 2012-03-13 MED ORDER — SODIUM CHLORIDE 0.9 % IV SOLN: 250.0000 mL | INTRAVENOUS | Status: DC

## 2012-03-13 MED ORDER — MORPHINE SULFATE 2 MG/ML IJ SOLN: 1.0000 mg | INTRAMUSCULAR | Status: DC | PRN

## 2012-03-13 NOTE — ED Notes (Addendum)
Patient and interpretor transporting to cath lab with nitro still infusing and on cardiac monitor, cath lab RN advised no heparin has been administered at time of departure

## 2012-03-13 NOTE — CV Procedure (Signed)
[facility] PERCUTANEOUS CORONARY INTERVENTION REPORT  NAME:  [name]   MRN: [id number] DOB:  1941-04-22   ADMIT DATE: 03/13/2012  INTERVENTIONAL CARDIOLOGIST: [name], M.D., MS PRIMARY CARE PROVIDER: Runell Gess, MD PRIMARY CARDIOLOGIST:   PATIENT:  [name] is a 71 y.o. male ([place] with a history of anterior STEMI in July 2011. He had an LAD BMS then. He had moderate RCA/CFX disease treated medically. His initial EF was 25-30% but this improved to 35-40% by echo Sept 2011. We saw him this past March for some Rt sided chest pain. Myoview was low risk and the pt and family were reassured. He presents to the ER now with SSCP, sent from Urgent Care. History is obtained from his daughter as the pt speaks no [place]. He has been having off and on SSCP for the past month. Symptoms relieved with rest. Today his pain was "10/10", now down to "4/10" with NTG. No pleuritic component, no associated nausea vomiting or diaphoresis. He has LBBB but there is new inferior/lateral ST depression. He was seen in consultation emergency room by Dr. Rennis Golden and Mr. [name] is referred for invasive evaluation with cardiac catheterization.  PRE-OPERATIVE DIAGNOSIS:    Unstable angina -- new onset angina with crescendo pattern  Known coronary disease status post LAD PCI  Known ischemic cardiopathy  PROCEDURES PERFORMED:    Left Heart Catheterization with Native Coronary Angiography Via 5 French Right Radial Artery Access  Left Ventriculography with 33 m/ contrast over 3 seconds  PROCEDURE: Consent:Risks of procedure as well as the alternatives and risks of each were explained to the (patient/caregiver).  Consent for procedure obtained. Consent for signed by MD and patient with RN witness -- placed on chart.  PROCEDURE: The patient was brought to the 2nd Floor [facility] in the fasting state and prepped and draped in the usual sterile fashion  for Right radial and/or groin access. A modified Allen's test with plethysmography demonstrated excellent Ulnar Artery collateral flow, acceptable for radial access.  Sterile technique was used including antiseptics, cap, gloves, gown, hand hygiene, mask and sheet. Skin prep: Chlorhexidine;  Time Out: Verified patient identification, verified procedure, site/side was marked, verified correct patient position, special equipment/implants available, medications/allergies/relevent history reviewed, required imaging and test results available.  Performed  Access:  Right Radial Artery;  5 Fr  Glide-Sheath -- Seldinger technique with 5 French glide sheath micropuncture kit. 10 mL the radial cocktail administered along with 2500 units of IV Heparin. Diagnostic:  5 Fr TIG 4.0 advanced over Versicore wire and used to engage left and right coronary artery. Exchanged over long exchange safety J for an angled Pigtail catheter used to cross the Aortic valve.  Left and Right Coronary Artery Angiography: 5 Fr TIG 4.0  LV Hemodynamics (LV Gram): 5 Fr Angled Pigtail  Hemodynamics:  Central Aortic / Mean Pressures:  147/84 mmHg;  101 mmHg  Left Ventricular Pressures / EDP:  146/6 mmHg;  14 mmHg  Left Ventriculography:  EF:  Roughly 40 %  Wall Motion:  Global hypokinesis most notable in the inferoapical segment  Coronary Anatomy:  Left Main:  Large caliber vessel bifurcates into the LAD and Circumflex; angiographically normal LAD:  Large caliber vessel that reaches all around the apex it, perfusing the majority of your apex.  Several small to moderate caliber diagonal vessels.  The bare-metal stent in the mid LAD is patent with minimal in-stent restenosis, however the diameter the stent now appeared to  be undersized based on the remainder the reference LAD being about 3.0 -3.5 mm vessel Left Circumflex:  Large caliber vessel bifurcates into the AV groove circumflex and a first OM branch. Shortly after that  gives rise to a second obtuse marginal branch before terminating as the AV groove circumflex that gives off a posterior lateral obtuse marginal 3. At this bifurcation is a roughly 40% lesion the  OM1: Small to moderate Moderate caliber; minimal luminal irregularities  OM 2: Small-caliber (less than 2 mm) vessel, ostial 70-80% stenosis and in mild luminal irregularities distally  OM 3: Small moderate caliber vessel; minimal luminal irregularities  RCA: Large caliber dominant vessel with an SA nodal and atrial 1 major artery marginal branch in the mid vessel then bifurcates distally into the RPDA and RPL system. Luminal irregularities, diffuse 10-20%  RPDA: Small-caliber (less than 2 mm) vessel that reaches at may be half way to the apex there is a proximal focal 70% eccentric lesion -- borderline PCI amenable  RPL Sysytem:The Right Posterior AV Groove Branch moderate caliber vessel gives rise to a major RPL 1 as well as a small RPL 2 with diffuse mild luminal irregularities  TR Band:  1630 Hours, 14 mL air  ANESTHESIA:   Local Lidocaine 2 ml SEDATION:  2 mg IV Versed, 50 mcg IV fentanyl  MEDICATIONS: Radial Cocktail: 5 mg Verapamil, 400 mcg NTG, 2 ml 2% Lidocaine in 10 ml NS   Anticoagulation: IV Heparin 2500 Units  EBL:   < 5 ml  PATIENT DISPOSITION:    The patient was transferred to the PACU holding area in a hemodynamicaly stable, chest pain free condition.  The patient tolerated the procedure well, and there were no complications.  The patient was stable before, during, and after the procedure.  POST-OPERATIVE DIAGNOSIS:    Patent mid LAD bare-metal stent that upon evaluation appears to now be somewhat undersized for the diameter the the current diameter of the remainder the LAD. This would be equivalent to a 20-30% tubular stenosis for the LAD.  2 potential culprit lesions for unstable angina are both in small non-PCI amenable vessels being the right posterior descending artery  and the OM 2 branch of the circumflex.  My suspicion is that the patient's symptoms are related to accelerated/poorly controlled hypertension, having run out of his ACE inhibitor, that led to increase wall strain and supply versus demand mismatch with the small caliber vessel lesions.  Moderately reduced ejection fraction as previously described, however normal LVEDP once the blood pressure was reduced with sedation.  PLAN OF CARE:  Overnight monitoring post radial cath care.  Reinitiate ACE inhibitor therapy with IV nitroglycerin infusion overnight for blood pressure control.  Anticipate discharge the morning if his blood pressure and chest pain symptoms are stable.  We'll convert to oral Imdur on discharge   HARDING,DAVID W, M.D., M.S. [facility] [address] [place], [place]  [address]  [phone]  03/13/2012 5:32 PM

## 2012-03-13 NOTE — ED Notes (Signed)
Patient with Nitro x2 and ASA 324mg  pta per MD office and EMS

## 2012-03-13 NOTE — Progress Notes (Signed)
IV insertion attempts x 2 unsuccessful. EMS arrived before further attempts made.

## 2012-03-13 NOTE — ED Provider Notes (Signed)
History     CSN: [id number]  Arrival date & time 03/13/12  1051   First MD Initiated Contact with Patient 03/13/12 1131      Chief Complaint  Patient presents with  . Chest Pain    (Consider location/radiation/quality/duration/timing/severity/associated sxs/prior treatment) Patient is a 71 y.o. male presenting with chest pain. The history is provided by the patient. The history is limited by a language barrier. A language interpreter was used Garment/textile technologist is family member at bedside.).  Chest Pain The chest pain began more  than 1 month ago. Pertinent negatives for primary symptoms include no fever, no shortness of breath, no abdominal pain, no nausea and no vomiting.  Pertinent negatives for associated symptoms include no weakness. Associated symptoms comments: The patient reports one month of chest pain, on and off, worse with lying down. No shortness of breath, nausea, cough or fever. Pain changes locations - left chest, right chest, posterior right shoulder. He has had a cardiac catheterization in 2011 which required LAD stenting. He denies chest pain since that time until one month ago. Last routine visit with Dr. Allyson Sabal was March of this year. He also reports he is not taking any of his regular medications due to no refills, except the ramipril which he still takes..     Past Medical History  Diagnosis Date  . Stroke   . Hypertension   . Ischemic cardiomyopathy Sept 2011    EF improved to 35-40% 2D  . COPD (chronic obstructive pulmonary disease)   . CVA (cerebral infarction) 2009  . LBBB (left bundle branch block)     Past Surgical History  Procedure Date  . Coronary stent placement February 12 2010    LAD BMS    No family history on file.  History  Substance Use Topics  . Smoking status: Former Smoker    Quit date: 03/13/2009  . Smokeless tobacco: Not on file  . Alcohol Use: No      Review of Systems  Constitutional: Negative for fever.  Respiratory: Negative  for shortness of breath.   Cardiovascular: Positive for chest pain.  Gastrointestinal: Negative for nausea, vomiting and abdominal pain.  Genitourinary: Negative for dysuria and difficulty urinating.  Musculoskeletal: Negative for myalgias and arthralgias.  Skin: Negative for pallor.  Neurological: Negative for syncope, weakness and light-headedness.  Psychiatric/Behavioral: Negative for confusion and agitation.    Allergies  Review of patient's allergies indicates no known allergies.  Home Medications   Current Outpatient Rx  Name Route Sig Dispense Refill  . IBUPROFEN 200 MG PO TABS Oral Take 200-400 mg by mouth every 6 (six) hours as needed. Depends on pain if takes 200 mg or 400 mg    . RAMIPRIL 10 MG PO TABS Oral Take 10 mg by mouth daily.      BP 179/36  Temp 96.9 F (36.1 C) (Oral)  Resp 18  SpO2 99%  Physical Exam  Constitutional: He is oriented to person, place, and time. He appears well-developed and well-nourished. No distress.       Thin, cachectic male.  HENT:  Head: Normocephalic.  Neck: Normal range of motion. Neck supple.  Cardiovascular: Normal rate and regular rhythm.   No murmur heard. Pulmonary/Chest: Effort normal and breath sounds normal. He has no wheezes. He has no rales.  Abdominal: Soft. Bowel sounds are normal. There is no tenderness. There is no rebound and no guarding.  Musculoskeletal: Normal range of motion. He exhibits no edema.  Neurological: He is alert and  oriented to person, place, and time.  Skin: Skin is warm and dry. No rash noted.  Psychiatric: He has a normal mood and affect.    ED Course  Procedures (including critical care time)  Labs Reviewed  CBC WITH DIFFERENTIAL - Abnormal; Notable for the following:    RBC 3.91 (*)     Hemoglobin 11.3 (*)     HCT 34.4 (*)     All other components within normal limits  BASIC METABOLIC PANEL - Abnormal; Notable for the following:    GFR calc non Af Amer 69 (*)     GFR calc Af Amer 80  (*)     All other components within normal limits  POCT I-STAT, CHEM 8 - Abnormal; Notable for the following:    Hemoglobin 12.2 (*)     HCT 36.0 (*)     All other components within normal limits  POCT I-STAT TROPONIN I   Dg Chest 2 View  03/13/2012  *RADIOLOGY REPORT*  Clinical Data: Chest pain.  CHEST - 2 VIEW  Comparison: PA and lateral chest 09/24/2011 and 10/28/2006.  Findings: The chest is hyperexpanded.  Punctate calcified granuloma is again seen in the right upper lobe and there is some biapical scarring.  The lungs are otherwise clear.  Heart size is mildly enlarged. Mediastinal contours are otherwise unremarkable.  No pneumothorax or pleural effusion.  IMPRESSION:  1.  No acute finding. 2.  Pulmonary hyperexpansion compatible with emphysema.  Mild cardiomegaly noted.  Clinically significant discrepancy from primary report, if provided: None   Original Report Authenticated By: Bernadene Bell. D'ALESSIO, M.D.    Dg Chest Portable 1 View  03/13/2012  *RADIOLOGY REPORT*  Clinical Data: Chest pain  PORTABLE CHEST - 1 VIEW  Comparison: 03/13/2012  Findings: Hyperexpansion is consistent with emphysema. The cardiopericardial silhouette is enlarged.  No edema or focal airspace consolidation.  No parenchymal nodule or mass is evident. Bones are diffusely demineralized. Telemetry leads overlie the chest.  IMPRESSION: Stable.  Cardiomegaly with emphysema.  No acute cardiopulmonary findings.   Original Report Authenticated By: ERIC A. MANSELL, M.D.      1. Chest pain    1. Chest pain 2. H/o CAD 3. Medication noncompliance   MDM  The patient received NTG x 2 prior to arrival with some relief, per patient. He also received aspirin. He has a normal troponin but EKG changes as ST depressions in Anterior and lateral leads. Admit to cardiology.         Rodena Medin, PA-C 03/13/12 1352

## 2012-03-13 NOTE — Progress Notes (Signed)
Subjective:    [name] is a 71 y.o. male who presents for evaluation of chest pain. Caveat 2/2 [place] language barrier. Pt's daughter is translating. Onset was 4 weeks ago. Symptoms have been unchanged since that time. The patient describes the pain as dull, aching across anterior chest wall. Pain also radiates to the back . Pain radiates to L shoulder. No nausea or diaphoresis.  and back and L shoulder. . Patient rates pain as a 9/10 in intensity. Associated symptoms are: chest pain. Aggravating factors are: none. Alleviating factors are: none. Patient's cardiac risk factors are: advanced age (older than 84 for men, 78 for women), hypertension and hx/o stroke and ? mi (cardiologist dr. Allyson Sabal). The following portions of the patient's history were reviewed and updated as appropriate: allergies, current medications, past family history, past medical history, past social history, past surgical history and problem list. Pt is seen by [facility] for cardiology (Dr. Allyson Sabal). Pt states that he has been off of his medications (including plavix) for the last 2 month.  Review of Systems Pertinent items are noted in HPI.    Objective:   Filed Vitals:   03/13/12 0921  BP: 152/90  Pulse: 68  Temp: 97.7 F (36.5 C)  Resp: 18    Gen: up in chair, in minimal distress, frail appearing  HEENT: NCAT, EOMI, TMs clear bilaterally CV: RRR, no murmurs auscultated, no chest wall tenderness  PULM: CTAB, no wheezes, rales, rhoncii ABD: S/NT/+ bowel sounds  EXT: 2+ peripheral pulses Neuro: no focal neurological deficits noted       Cardiographics ECG: HR 63, LBBB, questionable ST segment elevation in V2 and V3 in comparison of previous.   Imaging Chest x-ray: Hyperinflated film, mildly enlarged cardiac silhouette, ? Widened mediastinum.   Assessment:    Chest pain  Plan:  Ddx includes ACS, aortic dissection, PE ACS protocol activated. EMS notified.  Full dose ASA, NTG, supplemental O2 Chest pain  improved from 9/10-5/10 s/p sublingual NTG x 1 Subjective:    [name] is a 71 y.o. male who presents for evaluation of chest pain. Onset was 4 weeks ago. Symptoms have been unchanged since that time. The patient describes the pain as dull, aching across anterior chest wall. Pain also radiates to the back . Pain radiates to L shoulder. No nausea or diaphoresis.  and back and L shoulder. . Patient rates pain as a 9/10 in intensity. Associated symptoms are: chest pain. Aggravating factors are: none. Alleviating factors are: none. Patient's cardiac risk factors are: advanced age (older than 70 for men, 13 for women), hypertension and hx/o stroke and ? mi (cardiologist dr. Allyson Sabal). The following portions of the patient's history were reviewed and updated as appropriate: allergies, current medications, past family history, past medical history, past social history, past surgical history and problem list.  Review of Systems Pertinent items are noted in HPI.    Objective:   Filed Vitals:   03/13/12 0921  BP: 152/90  Pulse: 68  Temp: 97.7 F (36.5 C)  Resp: 18    Gen: up in chair, in minimal distress, frail appearing  HEENT: NCAT, EOMI, TMs clear bilaterally CV: RRR, no murmurs auscultated, no chest wall tenderness  PULM: CTAB, no wheezes, rales, rhoncii ABD: S/NT/+ bowel sounds  EXT: 2+ peripheral pulses Neuro: no focal neurological deficits noted       Cardiographics ECG: HR 63, LBBB, questionable ST segment elevation in V2 and V3 in comparison of previous.   Imaging Chest x-ray: Hyperinflated film,  mildly enlarged cardiac silhouette, ? Widened mediastinum.   Assessment:    Chest pain  Plan:  Ddx includes ACS, aortic dissection, PE ACS protocol activated. EMS notified.  Full dose ASA, NTG, supplemental O2 Chest pain improved from 9/10-5/10 s/p sublingual NTG x 1

## 2012-03-13 NOTE — Interval H&P Note (Signed)
History and Physical Interval Note:  03/13/2012 3:55 PM  [name]  has presented today for surgery, with the diagnosis of crescendo angina. I've seen and evaluated the patient along with [name], [place]. I agree with his findings, examination and recommendations. The patient anterior MI treated with a bare-metal stent to the LAD, who is now having recurrence of substernal chest pain not as severe as his MI pain. He has not been on home medications such as aspirin and statin and he has run out of his Altace.  At this time I think, with the recent Myoview being low risk, the best option for this and will be to proceed with diabetes cardiac catheterization for definitive diagnosis and possible treatment. We will need to ensure that he is able stay on his medications and therefore am reluctant to consider using the drug eluding stents for possible in-stent stenosis. I think some issues with cultural as well as language barriers.  The various methods of treatment have been discussed with the patient and family.   Procedure:  Left Heart Catheterization with Coronary Angiography +/- ad hoc Percutaneous Coronary Intervention.  The procedure with Risks/Benefits/Alternatives and Indications was reviewed with the patient using a [place] interpreter (also by Dr. Rennis Golden in ER using the patient's daughter as interpreter).  All questions were answered.    Risks / Complications include, but not limited to: Death, MI, CVA/TIA, VF/VT (with defibrillation), Bradycardia (need for temporary pacer placement), contrast induced nephropathy, bleeding / bruising / hematoma / pseudoaneurysm, vascular or coronary injury (with possible emergent CT or Vascular Surgery), adverse medication reactions, infection.    The patient (with the assistance of an interpreter) voice understanding and agree to proceed.   I have signed the consent form and placed it on the chart for patient signature and RN witness.    After  consideration of risks, benefits and other options for treatment, the patient has consented to  Procedure(s) (LRB): LEFT HEART CATHETERIZATION WITH CORONARY ANGIOGRAM (N/A) +/- PCI as a surgical intervention .    The patient's history has been reviewed, patient examined, no change in status, stable for surgery.  I have reviewed the patient's chart and labs.  Questions were answered to the patient's satisfaction.    [name], M.D., M.S. [facility] [address] [place], [place]  [address]  [phone] Pager # [phone]  03/13/2012 3:56 PM

## 2012-03-13 NOTE — ED Provider Notes (Signed)
11:41 AM  Date: 03/13/2012  Rate: 60  Rhythm: normal sinus rhythm  QRS Axis: normal  Intervals: normal  ST/T Wave abnormalities: ST depressions inferiorly and ST depressions laterally  Conduction Disutrbances:left bundle branch block  Narrative Interpretation: Abnormal EKG  Old EKG Reviewed: changes noted--ST depression in inferior and lateral leads, new since tracing of 02/13/2010.      Carleene Cooper III, MD 03/13/12 [phone]

## 2012-03-13 NOTE — Progress Notes (Signed)
71 yo man with chest pain.  He had STEMI in 2011.  Exam shows heart sounds normal, lungs clear.  EKG shows LBBB, ST depressions in the inferior and lateral leads.  TNI negative.  Will call [facility] to admit him for chest pain workup.

## 2012-03-13 NOTE — Progress Notes (Signed)
ANTICOAGULATION CONSULT NOTE - Initial Consult  Pharmacy Consult for heparin Indication: chest pain/ACS  No Known Allergies  Patient Measurements: Wt= 53 kg  Vital Signs: Temp: 98 F (36.7 C) (08/23 1401) Temp src: Oral (08/23 1401) BP: 163/90 mmHg (08/23 1401) Pulse Rate: 56  (08/23 1401)  Labs:  Basename 03/13/12 1411 03/13/12 1406 03/13/12 1206 03/13/12 1154  HGB -- -- 12.2* 11.3*  HCT -- -- 36.0* 34.4*  PLT -- -- -- 202  APTT -- -- -- --  LABPROT -- 13.5 -- --  INR -- 1.01 -- --  HEPARINUNFRC -- -- -- --  CREATININE -- -- 1.20 1.05  CKTOTAL -- -- -- --  CKMB -- -- -- --  TROPONINI <0.30 -- -- --    The CrCl is unknown because both a height and weight (above a minimum accepted value) are required for this calculation.   Medical History: Past Medical History  Diagnosis Date  . Stroke   . Hypertension   . Ischemic cardiomyopathy Sept 2011    EF improved to 35-40% 2D  . COPD (chronic obstructive pulmonary disease)   . CVA (cerebral infarction) 2009  . LBBB (left bundle branch block)      Assessment: 71 yo male here with CP to start heparin noted for possible cath today.  Goal of Therapy:  Heparin level 0.3-0.7 units/ml Monitor platelets by anticoagulation protocol: Yes   Plan:  -Heparin 4000 units IV x1 followed by 650 units/hr (~12 units/kg/hr) -Heparin level in 8 hours and daily wth CBC daily -Will follow for cath plans  Harland German, Pharm D 03/13/2012 2:58 PM

## 2012-03-13 NOTE — Progress Notes (Signed)
Patient ID: [name] MRN: [id number], DOB/AGE: [birth year]   Admit date: 03/13/2012   Primary Physician: Runell Gess, MD Primary Cardiologist: Dr Allyson Sabal  HPI: 71 y/o [place] male with a history of anterior STEMI in July 2011. He had an LAD BMS then. He had moderate RCA/CFX disease treated medically. His initial EF was 25-30% but this improved to 35-40% by echo Sept 2011. We saw him this past March for some Rt sided chest pain. Myoview was low risk and the pt and family were reassured. He presents to the ER now with SSCP, sent from Urgent Care. History is obtained from his daughter as the pt speaks no [place]. He has been having off and on SSCP for the past month. Symptoms relieved with rest. Today his pain was "10/10", now down to "4/10" with NTG. No pleuritic component, no associated nausea vomiting or diaphoresis. He has LBBB but there is new inferior/lateral ST depression.   Problem List: Past Medical History  Diagnosis Date  . Stroke   . Hypertension   . Ischemic cardiomyopathy Sept 2011    EF improved to 35-40% 2D  . COPD (chronic obstructive pulmonary disease)   . CVA (cerebral infarction) 2009  . LBBB (left bundle branch block)     Past Surgical History  Procedure Date  . Coronary stent placement February 12 2010    LAD BMS     Allergies: No Known Allergies   Home Medications Altace- ran out of his other medications a month ago     No family history on file.   History   Social History  . Marital Status: Married    Spouse Name: N/A    Number of Children: N/A  . Years of Education: N/A   Occupational History  . Not on file.   Social History Main Topics  . Smoking status: Former Smoker    Quit date: 03/13/2009  . Smokeless tobacco: Not on file  . Alcohol Use: No  . Drug Use: No  . Sexually Active: Not on file   Other Topics Concern  . Not on file   Social History Narrative  . No narrative on file     Review of Systems: General: negative for  chills, fever, night sweats or weight changes.  Cardiovascular: negative for chest pain, dyspnea on exertion, edema, orthopnea, palpitations, paroxysmal nocturnal dyspnea or shortness of breath Dermatological: negative for rash Respiratory: negative for cough or wheezing Urologic: negative for hematuria Abdominal: negative for nausea, vomiting, diarrhea, bright red blood per rectum, melena, or hematemesis Neurologic: negative for visual changes, syncope, or dizziness All other systems reviewed and are otherwise negative except as noted above.  Physical Exam: Blood pressure 179/36, temperature 96.9 F (36.1 C), temperature source Oral, resp. rate 18, SpO2 99.00%.  General appearance: alert, cooperative, no distress and pale Neck: no adenopathy, no carotid bruit, no JVD, supple, symmetrical, trachea midline and thyroid not enlarged, symmetric, no tenderness/mass/nodules Lungs: clear to auscultation bilaterally Heart: regular rate and rhythm and 2/6 systolic murmur Aov/ LSB. preserved S2 Abdomen: soft, non-tender; bowel sounds normal; no masses,  no organomegaly Extremities: extremities normal, atraumatic, no cyanosis or edema Pulses: 2+ and symmetric Skin: Skin color, texture, turgor normal. No rashes or lesions Neurologic: Grossly normal    Labs:   Results for orders placed during the hospital encounter of 03/13/12 (from the past 24 hour(s))  CBC WITH DIFFERENTIAL     Status: Abnormal   Collection Time   03/13/12 11:54 AM  Component Value Range   WBC 4.4  4.0 - 10.5 K/uL   RBC 3.91 (*) 4.22 - 5.81 MIL/uL   Hemoglobin 11.3 (*) 13.0 - 17.0 g/dL   HCT 16.1 (*) 09.6 - 04.5 %   MCV 88.0  78.0 - 100.0 fL   MCH 28.9  26.0 - 34.0 pg   MCHC 32.8  30.0 - 36.0 g/dL   RDW 40.9  81.1 - 91.4 %   Platelets 202  150 - 400 K/uL   Neutrophils Relative 63  43 - 77 %   Neutro Abs 2.8  1.7 - 7.7 K/uL   Lymphocytes Relative 27  12 - 46 %   Lymphs Abs 1.2  0.7 - 4.0 K/uL   Monocytes Relative 8   3 - 12 %   Monocytes Absolute 0.4  0.1 - 1.0 K/uL   Eosinophils Relative 2  0 - 5 %   Eosinophils Absolute 0.1  0.0 - 0.7 K/uL   Basophils Relative 1  0 - 1 %   Basophils Absolute 0.0  0.0 - 0.1 K/uL  BASIC METABOLIC PANEL     Status: Abnormal   Collection Time   03/13/12 11:54 AM      Component Value Range   Sodium 140  135 - 145 mEq/L   Potassium 4.9  3.5 - 5.1 mEq/L   Chloride 104  96 - 112 mEq/L   CO2 29  19 - 32 mEq/L   Glucose, Bld 89  70 - 99 mg/dL   BUN 21  6 - 23 mg/dL   Creatinine, Ser 7.82  0.50 - 1.35 mg/dL   Calcium 9.3  8.4 - 95.6 mg/dL   GFR calc non Af Amer 69 (*) >90 mL/min   GFR calc Af Amer 80 (*) >90 mL/min  POCT I-STAT TROPONIN I     Status: Normal   Collection Time   03/13/12 12:03 PM      Component Value Range   Troponin i, poc 0.00  0.00 - 0.08 ng/mL   Comment 3           POCT I-STAT, CHEM 8     Status: Abnormal   Collection Time   03/13/12 12:06 PM      Component Value Range   Sodium 140  135 - 145 mEq/L   Potassium 5.0  3.5 - 5.1 mEq/L   Chloride 105  96 - 112 mEq/L   BUN 23  6 - 23 mg/dL   Creatinine, Ser 2.13  0.50 - 1.35 mg/dL   Glucose, Bld 88  70 - 99 mg/dL   Calcium, Ion 0.86  5.78 - 1.30 mmol/L   TCO2 26  0 - 100 mmol/L   Hemoglobin 12.2 (*) 13.0 - 17.0 g/dL   HCT 46.9 (*) 62.9 - 52.8 %     Radiology/Studies: Dg Chest 2 View  03/13/2012  *RADIOLOGY REPORT*  Clinical Data: Chest pain.  CHEST - 2 VIEW  Comparison: PA and lateral chest 09/24/2011 and 10/28/2006.  Findings: The chest is hyperexpanded.  Punctate calcified granuloma is again seen in the right upper lobe and there is some biapical scarring.  The lungs are otherwise clear.  Heart size is mildly enlarged. Mediastinal contours are otherwise unremarkable.  No pneumothorax or pleural effusion.  IMPRESSION:  1.  No acute finding. 2.  Pulmonary hyperexpansion compatible with emphysema.  Mild cardiomegaly noted.  Clinically significant discrepancy from primary report, if provided: None    Original Report Authenticated By: Bernadene Bell. Maricela Curet, M.D.  Dg Chest Portable 1 View  03/13/2012  *RADIOLOGY REPORT*  Clinical Data: Chest pain  PORTABLE CHEST - 1 VIEW  Comparison: 03/13/2012  Findings: Hyperexpansion is consistent with emphysema. The cardiopericardial silhouette is enlarged.  No edema or focal airspace consolidation.  No parenchymal nodule or mass is evident. Bones are diffusely demineralized. Telemetry leads overlie the chest.  IMPRESSION: Stable.  Cardiomegaly with emphysema.  No acute cardiopulmonary findings.   Original Report Authenticated By: ERIC A. MANSELL, M.D.     EKG:NSR LBBB with new inferior/ lateral ST depressioon  ASSESSMENT AND PLAN:  Principal Problem:  *Unstable angina Active Problems:  CAD, STEMI July 2011 Rx'd with LAD BMS with moderate residual RCA/CFX disease. Myoview OK March 2013  Ischemic cardiomyopathy, EF 25-30% with STEMI, improved to 35-40% 2D Sept 2011  HTN (hypertension), suboptimal control  Non compliance with medical treatment, ran out of meds one month ago  LBBB (left bundle branch block)  COPD (chronic obstructive pulmonary disease) by history and CXR  History of CVA 2009  Plan- Admit, Heparin, NTG, ASA, Statin. He will need cath, hopefully it can be done today.  Deland Pretty, PA-C 03/13/2012, 1:35 PM

## 2012-03-13 NOTE — ED Notes (Signed)
Patient seen today at [facility] with c/o chest pain x 1 month.  Patient states central chest pain which is intermittant.  Patient denies sob,nausea,vomiting.

## 2012-03-13 NOTE — Progress Notes (Signed)
Pt. Seen and examined. Agree with the NP/PA-C note as written.  71 yo [place] male who I have previously met and is a patient of Dr. Allyson Sabal. He reports through his daughter Musician) that he has been out of his medications for several weeks. He has not recently followed up with [place] or called due to lack of funds to be able to pay a co-pay or pay to have his medications refilled. The chest pain is described as substernal, somewhat squeezing, not associated with exertion, however, it was promptly relieved by nitroglycerin in the ER. The severity of the pain seems to be escalating. He has a chronic LBBB but has new inferolateral ST segment depression, concerning for ischemia.   After discussing the situation with the patient and his daughter, he is agreeable to proceed with cardiac catherization. He has been NPO today, therefore, we can likely arrange it this afternoon.  Thanks for the consult.  Chrystie Nose, MD, [facility] Attending Cardiologist [facility]

## 2012-03-14 ENCOUNTER — Encounter (HOSPITAL_COMMUNITY): Payer: Self-pay | Admitting: Cardiology

## 2012-03-14 DIAGNOSIS — Z955 Presence of coronary angioplasty implant and graft: Secondary | ICD-10-CM | POA: Insufficient documentation

## 2012-03-14 LAB — LIPID PANEL
Cholesterol: 210 mg/dL — ABNORMAL HIGH (ref 0–200)
HDL: 52 mg/dL (ref >39)
LDL Cholesterol: 135 mg/dL — ABNORMAL HIGH (ref 0–99)
Total CHOL/HDL Ratio: 4 RATIO
Triglycerides: 113 mg/dL (ref <150)
VLDL: 23 mg/dL (ref 0–40)

## 2012-03-14 MED ORDER — NITROGLYCERIN 0.4 MG SL SUBL: 0.4000 mg | SUBLINGUAL_TABLET | SUBLINGUAL | Status: DC | PRN

## 2012-03-14 MED ORDER — CLOPIDOGREL BISULFATE 75 MG PO TABS: 75.0000 mg | ORAL_TABLET | Freq: Every day | ORAL | Status: DC

## 2012-03-14 MED ORDER — ASPIRIN 81 MG PO TBEC: 81.0000 mg | DELAYED_RELEASE_TABLET | Freq: Every day | ORAL | Status: DC

## 2012-03-14 MED ORDER — ISOSORBIDE MONONITRATE ER 30 MG PO TB24
30.0000 mg | ORAL_TABLET | Freq: Every day | ORAL | Status: DC
  Administered 2012-03-14: 30 mg via ORAL
  Filled 2012-03-14: qty 1

## 2012-03-14 MED ORDER — ISOSORBIDE MONONITRATE ER 30 MG PO TB24: 30.0000 mg | ORAL_TABLET | Freq: Every day | ORAL | Status: DC

## 2012-03-14 MED ORDER — CARVEDILOL 3.125 MG PO TABS: 3.1250 mg | ORAL_TABLET | Freq: Two times a day (BID) | ORAL | Status: DC

## 2012-03-14 MED ORDER — ROSUVASTATIN CALCIUM 20 MG PO TABS: 20.0000 mg | ORAL_TABLET | Freq: Every day | ORAL | Status: DC

## 2012-03-14 NOTE — Progress Notes (Signed)
[facility] DAILY PROGRESS NOTE  NAME:  [name]   MRN: [id number] DOB:  09-23-1940   ADMIT DATE: 03/13/2012   Patient Description   71 y.o. male with PMH below: presented with progressively worsening exertional chest pain consistent with crescendo-Unstable angina.  Had run out of Altace for the past few weeks before onset of symptoms.   Past Medical History  Diagnosis Date  . Hypertension   . Ischemic cardiomyopathy Sept 2011    EF improved to 35-40% 2D  . COPD (chronic obstructive pulmonary disease)   . CVA (cerebral infarction) 2009  . LBBB (left bundle branch block)   . Stroke   . History of acute anterior wall MI 2011    s/p PCI to LAD; 2.25 x 12 mm BMS    Clinical Course: LHC yesterday -- see full report.  R radial access - no complications Length of Stay:  LOS: 1 day   Subjective:   Today [name] denies any further CP. Has r/o for MI  Objective:  Temp:  [96.9 F (36.1 C)-98.6 F (37 C)] 98 F (36.7 C) (08/24 0700) Pulse Rate:  [53-70] 53  (08/24 0700) Resp:  [12-19] 14  (08/24 0700) BP: (130-179)/(36-101) 130/81 mmHg (08/24 0700) SpO2:  [95 %-100 %] 95 % (08/24 0700) Weight:  [53.524 kg (118 lb)-54.6 kg (120 lb 5.9 oz)] 54.6 kg (120 lb 5.9 oz) (08/24 0600) Weight change:  Physical Exam: General appearance: alert, cooperative, appears stated age, no distress and pleasant Neck: no adenopathy, no carotid bruit, no JVD, supple, symmetrical, trachea midline and thyroid not enlarged, symmetric, no tenderness/mass/nodules Lungs: clear to auscultation bilaterally, normal percussion bilaterally and just diminished BS throughout from COPD Heart: regular rate and rhythm, S1, S2 normal, no murmur, click, rub or gallop Abdomen: normal findings: liver span normal to percussion, no organomegaly, no scars, striae, dilated veins, rashes, or lesions, spleen non-palpable, symmetric, umbilicus normal and soft, NT/ND and abnormal findings:  systolic bruit  and palpable pulsations in the mid abdomen - ? aortic vs. SMA Extremities: extremities normal, atraumatic, no cyanosis or edema and R Radial acess site c/d/i Pulses: 2+ and symmetric Modified Allen's with Plethysmography - normal R Radial flow Neurologic: Grossly normal, normal gait.  Intake/Output from previous day: 08/23 0701 - 08/24 0700 In: 123 [P.O.:120; I.V.:3] Out: 200 [Urine:200]  Intake/Output Summary (Last 24 hours) at 03/14/12 0901 Last data filed at 03/14/12 0800  Gross per 24 hour  Intake    126 ml  Output    200 ml  Net    -74 ml    Results for orders placed during the hospital encounter of 03/13/12 (from the past 24 hour(s))  CBC WITH DIFFERENTIAL     Status: Abnormal   Collection Time   03/13/12 11:54 AM      Component Value Range   WBC 4.4  4.0 - 10.5 K/uL   RBC 3.91 (*) 4.22 - 5.81 MIL/uL   Hemoglobin 11.3 (*) 13.0 - 17.0 g/dL   HCT 40.9 (*) 81.1 - 91.4 %   MCV 88.0  78.0 - 100.0 fL   MCH 28.9  26.0 - 34.0 pg   MCHC 32.8  30.0 - 36.0 g/dL   RDW 78.2  95.6 - 21.3 %   Platelets 202  150 - 400 K/uL   Neutrophils Relative 63  43 - 77 %   Neutro Abs 2.8  1.7 - 7.7 K/uL   Lymphocytes Relative 27  12 - 46 %  Lymphs Abs 1.2  0.7 - 4.0 K/uL   Monocytes Relative 8  3 - 12 %   Monocytes Absolute 0.4  0.1 - 1.0 K/uL   Eosinophils Relative 2  0 - 5 %   Eosinophils Absolute 0.1  0.0 - 0.7 K/uL   Basophils Relative 1  0 - 1 %   Basophils Absolute 0.0  0.0 - 0.1 K/uL  BASIC METABOLIC PANEL     Status: Abnormal   Collection Time   03/13/12 11:54 AM      Component Value Range   Sodium 140  135 - 145 mEq/L   Potassium 4.9  3.5 - 5.1 mEq/L   Chloride 104  96 - 112 mEq/L   CO2 29  19 - 32 mEq/L   Glucose, Bld 89  70 - 99 mg/dL   BUN 21  6 - 23 mg/dL   Creatinine, Ser 0.98  0.50 - 1.35 mg/dL   Calcium 9.3  8.4 - 11.9 mg/dL   GFR calc non Af Amer 69 (*) >90 mL/min   GFR calc Af Amer 80 (*) >90 mL/min  POCT I-STAT TROPONIN I     Status: Normal   Collection Time    03/13/12 12:03 PM      Component Value Range   Troponin i, poc 0.00  0.00 - 0.08 ng/mL   Comment 3           POCT I-STAT, CHEM 8     Status: Abnormal   Collection Time   03/13/12 12:06 PM      Component Value Range   Sodium 140  135 - 145 mEq/L   Potassium 5.0  3.5 - 5.1 mEq/L   Chloride 105  96 - 112 mEq/L   BUN 23  6 - 23 mg/dL   Creatinine, Ser 1.47  0.50 - 1.35 mg/dL   Glucose, Bld 88  70 - 99 mg/dL   Calcium, Ion 8.29  5.62 - 1.30 mmol/L   TCO2 26  0 - 100 mmol/L   Hemoglobin 12.2 (*) 13.0 - 17.0 g/dL   HCT 13.0 (*) 86.5 - 78.4 %  PROTIME-INR     Status: Normal   Collection Time   03/13/12  2:06 PM      Component Value Range   Prothrombin Time 13.5  11.6 - 15.2 seconds   INR 1.01  0.00 - 1.49  TSH     Status: Normal   Collection Time   03/13/12  2:06 PM      Component Value Range   TSH 1.363  0.350 - 4.500 uIU/mL  TROPONIN I     Status: Normal   Collection Time   03/13/12  2:11 PM      Component Value Range   Troponin I <0.30  <0.30 ng/mL  PRO B NATRIURETIC PEPTIDE     Status: Abnormal   Collection Time   03/13/12  2:11 PM      Component Value Range   Pro B Natriuretic peptide (BNP) 2148.0 (*) 0 - 125 pg/mL  MRSA PCR SCREENING     Status: Normal   Collection Time   03/13/12  7:01 PM      Component Value Range   MRSA by PCR NEGATIVE  NEGATIVE  TROPONIN I     Status: Normal   Collection Time   03/13/12  9:42 PM      Component Value Range   Troponin I <0.30  <0.30 ng/mL  LIPID PANEL     Status: Abnormal  Collection Time   03/14/12  5:05 AM      Component Value Range   Cholesterol 210 (*) 0 - 200 mg/dL   Triglycerides 295  <621 mg/dL   HDL 52  >30 mg/dL   Total CHOL/HDL Ratio 4.0     VLDL 23  0 - 40 mg/dL   LDL Cholesterol 865 (*) 0 - 99 mg/dL   ECG - LBBB  Cath: Patent LAD stent with moderate to severe disease in OM & rPDA - not PCI amenable.  EF ~40%  MAR Reviewed  Assessment/Plan:  Principal Problem:  *Unstable angina Active Problems:  CAD, STEMI July  2011 Rx'd with LAD BMS with moderate residual RCA/CFX disease. Myoview OK March 2013  Ischemic cardiomyopathy, EF 25-30% with STEMI, improved to 35-40% 2D Sept 2011  HTN (hypertension), suboptimal control  Non compliance with medical treatment, ran out of meds one month ago  LBBB (left bundle branch block)  COPD (chronic obstructive pulmonary disease) by history and CXR  History of CVA 2009  I think the etiology of his worsening angina is related more to uncontrolled HTN as a result of ACE-I non-adherence resulting in increased myocardial demand which may well lead to ischemia in the OM & PDA territory.  He r/o for MI. His BP looks great now -- still on NTG gtt though.  Will wean off NTG gtt & start IMDUR 30mg  Have re-instituted ACE-I therapy along with ASA & Plavix, statin (on Crestor @ home).  Provided his BP remains controlled off NTG gtt & he is able to ambulate around the unit today without chest pain, he should be ready to d/c later today with ROV in ~1-2 weeks with Dr. Allyson Sabal (or PA/NP).  He has no CHF findings -- no diastolic HF findings despite elevated HTN & baseline decreased EF.   Time Spent Directly with Patient:  15 minutes   Hanaan Gancarz W, M.D., M.S. [facility] [address] [place], [place]  [address]  [phone]  03/14/2012 9:01 AM

## 2012-03-14 NOTE — ED Provider Notes (Signed)
Medical screening examination/treatment/procedure(s) were conducted as a shared visit with non-physician practitioner(s) and myself.  I personally evaluated the patient during the encounter 71 yo man with chest pain. He had STEMI in 2011. Exam shows heart sounds normal, lungs clear. EKG shows LBBB, ST depressions in the inferior and lateral leads. TNI negative. Will call [facility] to admit him for chest pain workup.       Carleene Cooper III, MD 03/14/12 [phone]

## 2012-03-19 NOTE — Discharge Summary (Signed)
Physician Discharge Summary  Patient ID: [name] MRN: [id number] DOB/AGE: Aug 31, 1940 71 y.o.  Admit date: 03/13/2012 Discharge date: 03/14/2012 Discharge Diagnoses:  Principal Problem:  *Unstable angina Active Problems:  ANGINA, UNSTABLE,related more to uncontrolled HTN as a result of ACE-I non-adherence resulting in increased myocardial demand which may well lead to ischemia in the OM & PDA territory.    CAD, STEMI July 2011 Rx'd with LAD BMS with moderate residual RCA/CFX disease. Myoview OK March 2013  Ischemic cardiomyopathy, EF 25-30% with STEMI, improved to 35-40% 2D Sept 2011  LBBB (left bundle branch block)  HTN (hypertension), suboptimal control  COPD (chronic obstructive pulmonary disease) by history and CXR  History of CVA 2009  Non compliance with medical treatment, ran out of meds one month ago Discharged Condition: good  Procedures: 03/13/2012 cardiac catheterization by Dr. Bryan Lemma.  Hospital Course: 71 y/o [place] male with a history of anterior STEMI in July 2011. He had an LAD BMS then. He had moderate RCA/CFX disease treated medically. His initial EF was 25-30% but this improved to 35-40% by echo Sept 2011. We saw him this past March for some Rt sided chest pain. Myoview was low risk and the pt and family were reassured. He presents to the ER now with SSCP, sent from Urgent Care. History is obtained from his daughter as the pt speaks no [place]. He has been having off and on SSCP for the past month. Symptoms relieved with rest. Today his pain was "10/10", now down to "4/10" with NTG. No pleuritic component, no associated nausea vomiting or diaphoresis. He has LBBB but there is new inferior/lateral ST depression.   Due to abnormal EKG and continued chest pain patient was taken to the cardiac catheterization lab.  EF was 40%.  Other results of cardiac cath: POST-OPERATIVE DIAGNOSIS:  Patent mid LAD bare-metal stent that upon evaluation appears to now be somewhat  undersized for the diameter the the current diameter of the remainder the LAD. This would be equivalent to a 20-30% tubular stenosis for the LAD.  2 potential culprit lesions for unstable angina are both in small non-PCI amenable vessels being the right posterior descending artery and the OM 2 branch of the circumflex.  My suspicion is that the patient's symptoms are related to accelerated/poorly controlled hypertension, having run out of his ACE inhibitor, that led to increase wall strain and supply versus demand mismatch with the small caliber vessel lesions.  Moderately reduced ejection fraction as previously described, however normal LVEDP once the blood pressure was reduced with sedation.  By the morning of 03/14/2012 patient was stable and his IV nitroglycerin was DC'd and he was placed on imdur.  His blood pressure medication Altace has been restarted.  Cardiac enzymes were negative.  Blood pressure was improving.  He will be arranged an appointment as an outpatient.  Consults: None  Significant Diagnostic Studies: Laboratory data  Troponin I was negative at less than 0.30. Pro BNP 2,148  Lipid panel total cholesterol 210 triglycerides 113 HDL 52 LDL 135.  BMET    Component Value Date/Time   NA 140 03/13/2012 1206   K 5.0 03/13/2012 1206   CL 105 03/13/2012 1206   CO2 29 03/13/2012 1154   GLUCOSE 88 03/13/2012 1206   BUN 23 03/13/2012 1206   CREATININE 1.20 03/13/2012 1206   CALCIUM 9.3 03/13/2012 1154   GFRNONAA 69* 03/13/2012 1154   GFRAA 80* 03/13/2012 1154    CBC    Component Value Date/Time  WBC 4.4 03/13/2012 1154   RBC 3.91* 03/13/2012 1154   HGB 12.2* 03/13/2012 1206   HCT 36.0* 03/13/2012 1206   PLT 202 03/13/2012 1154   MCV 88.0 03/13/2012 1154   MCH 28.9 03/13/2012 1154   MCHC 32.8 03/13/2012 1154   RDW 13.8 03/13/2012 1154   LYMPHSABS 1.2 03/13/2012 1154   MONOABS 0.4 03/13/2012 1154   EOSABS 0.1 03/13/2012 1154   BASOSABS 0.0 03/13/2012 1154   P. chest x-ray 03/13/2012:   IMPRESSION: Stable. Cardiomegaly with emphysema. No acute cardiopulmonary findings.  Discharge Exam: Blood pressure 137/91, pulse 53, temperature 98.4 F (36.9 C), temperature source Oral, resp. rate 18, height 5\' 5"  (1.651 m), weight 54.6 kg (120 lb 5.9 oz), SpO2 99.00%.   General appearance: alert, cooperative, appears stated age, no distress and pleasant  Neck: no adenopathy, no carotid bruit, no JVD, supple, symmetrical, trachea midline and thyroid not enlarged, symmetric, no tenderness/mass/nodules  Lungs: clear to auscultation bilaterally, normal percussion bilaterally and just diminished BS throughout from COPD  Heart: regular rate and rhythm, S1, S2 normal, no murmur, click, rub or gallop  Abdomen: normal findings: liver span normal to percussion, no organomegaly, no scars, striae, dilated veins, rashes, or lesions, spleen non-palpable, symmetric, umbilicus normal and soft, NT/ND and abnormal findings: systolic bruit and palpable pulsations in the mid abdomen - ? aortic vs. SMA  Extremities: extremities normal, atraumatic, no cyanosis or edema and R Radial acess site c/d/i  Pulses: 2+ and symmetric  Modified Allen's with Plethysmography - normal R Radial flow  Neurologic: Grossly normal, normal gait.     Disposition: 01-Home or Self Care   Medication List  As of 03/19/2012 11:45 AM   TAKE these medications         aspirin 81 MG EC tablet   Take 1 tablet (81 mg total) by mouth daily.      carvedilol 3.125 MG tablet   Commonly known as: COREG   Take 1 tablet (3.125 mg total) by mouth 2 (two) times daily with a meal.      clopidogrel 75 MG tablet   Commonly known as: PLAVIX   Take 1 tablet (75 mg total) by mouth daily with breakfast.      ibuprofen 200 MG tablet   Commonly known as: ADVIL,MOTRIN   Take 200-400 mg by mouth every 6 (six) hours as needed. Depends on pain if takes 200 mg or 400 mg      isosorbide mononitrate 30 MG 24 hr tablet   Commonly known as: IMDUR   Take  1 tablet (30 mg total) by mouth daily.      nitroGLYCERIN 0.4 MG SL tablet   Commonly known as: NITROSTAT   Place 1 tablet (0.4 mg total) under the tongue every 5 (five) minutes x 3 doses as needed for chest pain.      ramipril 10 MG tablet   Commonly known as: ALTACE   Take 10 mg by mouth daily.      rosuvastatin 20 MG tablet   Commonly known as: CRESTOR   Take 1 tablet (20 mg total) by mouth daily.           Follow-up Information    Follow up with Runell Gess, MD. (our office will call and arrange an appointment for you.)    Contact information:   [address] [place] [address] [phone]         Discharge instructions: Call [facility] if  any bleeding, swelling or drainage at cath site.  May shower, no tub baths for 48 hours for groin sticks.   No driving for 2 days.  No lifting with rt. Arm for 5 days  Heart healthy diet  Signed: INGOLD,LAURA R 03/19/2012, 11:45 AM  I was present on the AM of discharge.  He was stable post catheterization with improved BP control. His ACE-I was restarted along with a long acting nitrate. He had no post-cath complications.  I agree with Ms. Ingold's summary as noted above.

## 2012-10-25 ENCOUNTER — Ambulatory Visit (HOSPITAL_COMMUNITY): Admit: 2012-10-25 | Payer: Self-pay | Admitting: Cardiovascular Disease

## 2012-10-25 ENCOUNTER — Inpatient Hospital Stay (HOSPITAL_COMMUNITY): Payer: Medicare PPO

## 2012-10-25 ENCOUNTER — Encounter (HOSPITAL_COMMUNITY): Payer: Self-pay | Admitting: *Deleted

## 2012-10-25 ENCOUNTER — Encounter (HOSPITAL_COMMUNITY)
Admission: EM | Disposition: A | Discharge status: Home / Self Care | Payer: Self-pay | Source: Home / Self Care | Attending: Cardiovascular Disease

## 2012-10-25 ENCOUNTER — Inpatient Hospital Stay (HOSPITAL_COMMUNITY)
Admission: EM | Admit: 2012-10-25 | Discharge: 2012-11-01 | DRG: 291 | Disposition: A | Discharge status: Home / Self Care | Payer: Medicare PPO | Attending: Cardiovascular Disease | Admitting: Cardiovascular Disease

## 2012-10-25 ENCOUNTER — Emergency Department (HOSPITAL_COMMUNITY): Payer: Medicare PPO

## 2012-10-25 DIAGNOSIS — I2510 Atherosclerotic heart disease of native coronary artery without angina pectoris: Secondary | ICD-10-CM

## 2012-10-25 DIAGNOSIS — R630 Anorexia: Secondary | ICD-10-CM | POA: Diagnosis present

## 2012-10-25 DIAGNOSIS — I447 Left bundle-branch block, unspecified: Secondary | ICD-10-CM | POA: Diagnosis present

## 2012-10-25 DIAGNOSIS — J9600 Acute respiratory failure, unspecified whether with hypoxia or hypercapnia: Secondary | ICD-10-CM | POA: Diagnosis present

## 2012-10-25 DIAGNOSIS — I7389 Other specified peripheral vascular diseases: Secondary | ICD-10-CM | POA: Diagnosis present

## 2012-10-25 DIAGNOSIS — I160 Hypertensive urgency: Secondary | ICD-10-CM | POA: Diagnosis present

## 2012-10-25 DIAGNOSIS — I2589 Other forms of chronic ischemic heart disease: Secondary | ICD-10-CM

## 2012-10-25 DIAGNOSIS — I161 Hypertensive emergency: Secondary | ICD-10-CM

## 2012-10-25 DIAGNOSIS — J449 Chronic obstructive pulmonary disease, unspecified: Secondary | ICD-10-CM | POA: Diagnosis present

## 2012-10-25 DIAGNOSIS — R64 Cachexia: Secondary | ICD-10-CM | POA: Diagnosis present

## 2012-10-25 DIAGNOSIS — I5043 Acute on chronic combined systolic (congestive) and diastolic (congestive) heart failure: Principal | ICD-10-CM | POA: Diagnosis present

## 2012-10-25 DIAGNOSIS — I10 Essential (primary) hypertension: Secondary | ICD-10-CM

## 2012-10-25 DIAGNOSIS — R0902 Hypoxemia: Secondary | ICD-10-CM

## 2012-10-25 DIAGNOSIS — I739 Peripheral vascular disease, unspecified: Secondary | ICD-10-CM | POA: Diagnosis present

## 2012-10-25 DIAGNOSIS — I200 Unstable angina: Secondary | ICD-10-CM

## 2012-10-25 DIAGNOSIS — R0600 Dyspnea, unspecified: Secondary | ICD-10-CM

## 2012-10-25 DIAGNOSIS — I509 Heart failure, unspecified: Secondary | ICD-10-CM | POA: Diagnosis present

## 2012-10-25 DIAGNOSIS — Z8673 Personal history of transient ischemic attack (TIA), and cerebral infarction without residual deficits: Secondary | ICD-10-CM

## 2012-10-25 DIAGNOSIS — I714 Abdominal aortic aneurysm, without rupture, unspecified: Secondary | ICD-10-CM | POA: Diagnosis present

## 2012-10-25 DIAGNOSIS — J4489 Other specified chronic obstructive pulmonary disease: Secondary | ICD-10-CM | POA: Diagnosis present

## 2012-10-25 DIAGNOSIS — R000 Tachycardia, unspecified: Secondary | ICD-10-CM | POA: Diagnosis present

## 2012-10-25 DIAGNOSIS — Z681 Body mass index (BMI) 19 or less, adult: Secondary | ICD-10-CM

## 2012-10-25 DIAGNOSIS — Z87891 Personal history of nicotine dependence: Secondary | ICD-10-CM

## 2012-10-25 DIAGNOSIS — I5041 Acute combined systolic (congestive) and diastolic (congestive) heart failure: Secondary | ICD-10-CM | POA: Diagnosis present

## 2012-10-25 DIAGNOSIS — I255 Ischemic cardiomyopathy: Secondary | ICD-10-CM | POA: Diagnosis present

## 2012-10-25 DIAGNOSIS — E785 Hyperlipidemia, unspecified: Secondary | ICD-10-CM | POA: Diagnosis present

## 2012-10-25 DIAGNOSIS — Z9861 Coronary angioplasty status: Secondary | ICD-10-CM

## 2012-10-25 DIAGNOSIS — I252 Old myocardial infarction: Secondary | ICD-10-CM

## 2012-10-25 DIAGNOSIS — R9431 Abnormal electrocardiogram [ECG] [EKG]: Secondary | ICD-10-CM

## 2012-10-25 LAB — POCT I-STAT, CHEM 8
Creatinine, Ser: 1.1 mg/dL (ref 0.50–1.35)
Glucose, Bld: 159 mg/dL — ABNORMAL HIGH (ref 70–99)
HCT: 39 % (ref 39.0–52.0)
Hemoglobin: 13.3 g/dL (ref 13.0–17.0)
Potassium: 4.4 mEq/L (ref 3.5–5.1)
Sodium: 144 mEq/L (ref 135–145)
TCO2: 23 mmol/L (ref 0–100)

## 2012-10-25 LAB — APTT: aPTT: 34 seconds (ref 24–37)

## 2012-10-25 LAB — CBC
Hemoglobin: 12.5 g/dL — ABNORMAL LOW (ref 13.0–17.0)
MCH: 28.7 pg (ref 26.0–34.0)
Platelets: 207 10*3/uL (ref 150–400)
RBC: 4.35 MIL/uL (ref 4.22–5.81)
WBC: 6.8 10*3/uL (ref 4.0–10.5)

## 2012-10-25 LAB — COMPREHENSIVE METABOLIC PANEL
ALT: 149 U/L — ABNORMAL HIGH (ref 0–53)
AST: 217 U/L — ABNORMAL HIGH (ref 0–37)
Alkaline Phosphatase: 95 U/L (ref 39–117)
CO2: 21 mEq/L (ref 19–32)
Chloride: 108 mEq/L (ref 96–112)
GFR calc Af Amer: 70 mL/min — ABNORMAL LOW (ref >90)
GFR calc non Af Amer: 60 mL/min — ABNORMAL LOW (ref >90)
Glucose, Bld: 157 mg/dL — ABNORMAL HIGH (ref 70–99)
Potassium: 4.4 mEq/L (ref 3.5–5.1)
Sodium: 140 mEq/L (ref 135–145)
Total Bilirubin: 0.3 mg/dL (ref 0.3–1.2)

## 2012-10-25 LAB — POCT I-STAT 3, ART BLOOD GAS (G3+)
Bicarbonate: 21.7 mEq/L (ref 20.0–24.0)
TCO2: 23 mmol/L (ref 0–100)
pCO2 arterial: 41 mmHg (ref 35.0–45.0)
pH, Arterial: 7.332 — ABNORMAL LOW (ref 7.350–7.450)
pO2, Arterial: 244 mmHg — ABNORMAL HIGH (ref 80.0–100.0)

## 2012-10-25 LAB — HEPARIN LEVEL (UNFRACTIONATED): Heparin Unfractionated: 0.46 IU/mL (ref 0.30–0.70)

## 2012-10-25 SURGERY — Surgical Case

## 2012-10-25 MED ORDER — ASPIRIN 81 MG PO CHEW
324.0000 mg | CHEWABLE_TABLET | Freq: Once | ORAL | Status: AC
  Administered 2012-10-25: 324 mg via ORAL
  Filled 2012-10-25: qty 4

## 2012-10-25 MED ORDER — SODIUM CHLORIDE 0.9 % IJ SOLN: 3.0000 mL | INTRAMUSCULAR | Status: DC | PRN

## 2012-10-25 MED ORDER — GUAIFENESIN ER 600 MG PO TB12
600.0000 mg | ORAL_TABLET | Freq: Two times a day (BID) | ORAL | Status: DC
  Administered 2012-10-25 – 2012-11-01 (×15): 600 mg via ORAL
  Filled 2012-10-25 (×16): qty 1

## 2012-10-25 MED ORDER — CARVEDILOL 6.25 MG PO TABS
6.2500 mg | ORAL_TABLET | Freq: Two times a day (BID) | ORAL | Status: DC
  Administered 2012-10-25 – 2012-11-01 (×14): 6.25 mg via ORAL
  Filled 2012-10-25 (×17): qty 1

## 2012-10-25 MED ORDER — ASPIRIN EC 81 MG PO TBEC
81.0000 mg | DELAYED_RELEASE_TABLET | Freq: Every day | ORAL | Status: DC
  Administered 2012-10-26 – 2012-11-01 (×7): 81 mg via ORAL
  Filled 2012-10-25 (×7): qty 1

## 2012-10-25 MED ORDER — ASPIRIN EC 81 MG PO TBEC: 81.0000 mg | DELAYED_RELEASE_TABLET | Freq: Every day | ORAL | Status: DC

## 2012-10-25 MED ORDER — AMLODIPINE BESYLATE 5 MG PO TABS
5.0000 mg | ORAL_TABLET | Freq: Every day | ORAL | Status: DC
  Administered 2012-10-25 – 2012-10-27 (×3): 5 mg via ORAL
  Filled 2012-10-25 (×4): qty 1

## 2012-10-25 MED ORDER — LEVALBUTEROL HCL 0.63 MG/3ML IN NEBU
0.6300 mg | INHALATION_SOLUTION | Freq: Four times a day (QID) | RESPIRATORY_TRACT | Status: DC | PRN
  Administered 2012-10-25: 0.63 mg via RESPIRATORY_TRACT
  Filled 2012-10-25: qty 3

## 2012-10-25 MED ORDER — ALBUTEROL SULFATE (5 MG/ML) 0.5% IN NEBU
2.5000 mg | INHALATION_SOLUTION | RESPIRATORY_TRACT | Status: DC
  Administered 2012-10-25: 2.5 mg via RESPIRATORY_TRACT
  Filled 2012-10-25: qty 0.5

## 2012-10-25 MED ORDER — SODIUM CHLORIDE 0.9 % IV SOLN: 250.0000 mL | INTRAVENOUS | Status: DC | PRN

## 2012-10-25 MED ORDER — ACETAMINOPHEN 325 MG PO TABS
650.0000 mg | ORAL_TABLET | ORAL | Status: DC | PRN
  Administered 2012-10-26 – 2012-10-28 (×3): 650 mg via ORAL
  Filled 2012-10-25 (×4): qty 2

## 2012-10-25 MED ORDER — RAMIPRIL 10 MG PO CAPS
10.0000 mg | ORAL_CAPSULE | Freq: Every day | ORAL | Status: DC
  Administered 2012-10-25 – 2012-11-01 (×8): 10 mg via ORAL
  Filled 2012-10-25 (×8): qty 1

## 2012-10-25 MED ORDER — RAMIPRIL 10 MG PO TABS
10.0000 mg | ORAL_TABLET | Freq: Every day | ORAL | Status: DC
Start: 2012-10-25 — End: 2012-10-25

## 2012-10-25 MED ORDER — METOPROLOL TARTRATE 1 MG/ML IV SOLN
INTRAVENOUS | Status: AC
  Filled 2012-10-25: qty 5

## 2012-10-25 MED ORDER — ISOSORBIDE MONONITRATE ER 30 MG PO TB24
30.0000 mg | ORAL_TABLET | Freq: Every day | ORAL | Status: DC
  Filled 2012-10-25: qty 1

## 2012-10-25 MED ORDER — FUROSEMIDE 10 MG/ML IJ SOLN
40.0000 mg | Freq: Two times a day (BID) | INTRAMUSCULAR | Status: DC
  Administered 2012-10-25 – 2012-10-26 (×4): 40 mg via INTRAVENOUS
  Filled 2012-10-25 (×5): qty 4

## 2012-10-25 MED ORDER — METOPROLOL TARTRATE 1 MG/ML IV SOLN
5.0000 mg | Freq: Once | INTRAVENOUS | Status: AC
  Administered 2012-10-25: 5 mg via INTRAVENOUS

## 2012-10-25 MED ORDER — ALPRAZOLAM 0.25 MG PO TABS
0.2500 mg | ORAL_TABLET | Freq: Three times a day (TID) | ORAL | Status: DC | PRN
  Administered 2012-10-26: 0.25 mg via ORAL
  Filled 2012-10-25: qty 1

## 2012-10-25 MED ORDER — CLOPIDOGREL BISULFATE 75 MG PO TABS
75.0000 mg | ORAL_TABLET | Freq: Every day | ORAL | Status: DC
  Administered 2012-10-25 – 2012-11-01 (×8): 75 mg via ORAL
  Filled 2012-10-25 (×9): qty 1

## 2012-10-25 MED ORDER — HEPARIN (PORCINE) IN NACL 100-0.45 UNIT/ML-% IJ SOLN
850.0000 [IU]/h | INTRAMUSCULAR | Status: DC
  Administered 2012-10-25: 700 [IU]/h via INTRAVENOUS
  Administered 2012-10-25 – 2012-10-26 (×2): 850 [IU]/h via INTRAVENOUS
  Filled 2012-10-25 (×6): qty 250

## 2012-10-25 MED ORDER — HEPARIN SODIUM (PORCINE) 5000 UNIT/ML IJ SOLN
3500.0000 [IU] | INTRAMUSCULAR | Status: AC
  Administered 2012-10-25: 3500 [IU] via INTRAVENOUS
  Filled 2012-10-25: qty 1

## 2012-10-25 MED ORDER — ZOLPIDEM TARTRATE 5 MG PO TABS: 5.0000 mg | ORAL_TABLET | Freq: Every evening | ORAL | Status: DC | PRN

## 2012-10-25 MED ORDER — HEPARIN BOLUS VIA INFUSION
1500.0000 [IU] | Freq: Once | INTRAVENOUS | Status: AC
  Administered 2012-10-25: 1500 [IU] via INTRAVENOUS
  Filled 2012-10-25: qty 1500

## 2012-10-25 MED ORDER — SODIUM CHLORIDE 0.9 % IJ SOLN
3.0000 mL | Freq: Two times a day (BID) | INTRAMUSCULAR | Status: DC
  Administered 2012-10-25 – 2012-10-28 (×5): 3 mL via INTRAVENOUS
  Administered 2012-10-28: 09:00:00 via INTRAVENOUS
  Administered 2012-10-29 – 2012-10-31 (×5): 3 mL via INTRAVENOUS

## 2012-10-25 MED ORDER — TRAMADOL HCL 50 MG PO TABS
50.0000 mg | ORAL_TABLET | Freq: Four times a day (QID) | ORAL | Status: DC | PRN
  Administered 2012-10-26: 50 mg via ORAL
  Filled 2012-10-25 (×2): qty 1

## 2012-10-25 MED ORDER — ATORVASTATIN CALCIUM 40 MG PO TABS
40.0000 mg | ORAL_TABLET | Freq: Every day | ORAL | Status: DC
  Administered 2012-10-25: 40 mg via ORAL
  Filled 2012-10-25 (×2): qty 1

## 2012-10-25 MED ORDER — ONDANSETRON HCL 4 MG/2ML IJ SOLN: 4.0000 mg | Freq: Four times a day (QID) | INTRAMUSCULAR | Status: DC | PRN

## 2012-10-25 MED ORDER — HYDRALAZINE HCL 20 MG/ML IJ SOLN
10.0000 mg | INTRAMUSCULAR | Status: DC | PRN
  Administered 2012-10-25: 10 mg via INTRAVENOUS
  Filled 2012-10-25: qty 1

## 2012-10-25 MED ORDER — NITROGLYCERIN IN D5W 200-5 MCG/ML-% IV SOLN
5.0000 ug/min | INTRAVENOUS | Status: DC
  Administered 2012-10-25: 5 ug/min via INTRAVENOUS
  Administered 2012-10-26: 30 ug/min via INTRAVENOUS
  Filled 2012-10-25 (×2): qty 250

## 2012-10-25 MED ORDER — IPRATROPIUM BROMIDE 0.02 % IN SOLN
0.5000 mg | Freq: Four times a day (QID) | RESPIRATORY_TRACT | Status: DC
  Administered 2012-10-25: 0.5 mg via RESPIRATORY_TRACT
  Filled 2012-10-25: qty 2.5

## 2012-10-25 MED ORDER — SODIUM CHLORIDE 0.9 % IV SOLN
INTRAVENOUS | Status: DC
  Administered 2012-10-25 – 2012-10-27 (×2): via INTRAVENOUS

## 2012-10-25 MED ORDER — CARVEDILOL 3.125 MG PO TABS
3.1250 mg | ORAL_TABLET | Freq: Two times a day (BID) | ORAL | Status: DC
  Administered 2012-10-25: 3.125 mg via ORAL
  Filled 2012-10-25 (×3): qty 1

## 2012-10-25 NOTE — ED Notes (Signed)
The pt arrived in triage sob grey in color cold clammy skin no pain .  The pt has had this for 2 weeks intermittently.  He has seen his doctors and was told to double up on his meds.  sats low ekg done and pt to the back code stemi called

## 2012-10-25 NOTE — Progress Notes (Signed)
ANTICOAGULATION CONSULT NOTE - Initial Consult  Pharmacy Consult for Heparin Indication: chest pain/ACS  No Known Allergies  Patient Measurements: Height: 5' 4.96" (165 cm) Weight: 121 lb 4.1 oz (55 kg) (02/2012) IBW/kg (Calculated) : 61.41  Vital Signs: BP: 163/106 mmHg (04/06 0145) Pulse Rate: 85 (04/06 0145)  Labs:  Recent Labs  10/25/12 0111 10/25/12 0123  HGB 12.5* 13.3  HCT 37.1* 39.0  PLT 207  --   APTT 34  --   LABPROT 13.0  --   INR 0.99  --   CREATININE 1.17 1.10    Estimated Creatinine Clearance: 46.5 ml/min (by C-G formula based on Cr of 1.1).   Medical History: Past Medical History  Diagnosis Date  . Hypertension   . Ischemic cardiomyopathy Sept 2011    EF improved to 35-40% 2D  . COPD (chronic obstructive pulmonary disease)   . CVA (cerebral infarction) 2009  . LBBB (left bundle branch block)   . Stroke   . History of acute anterior wall MI 2011    s/p PCI to LAD; 2.25 x 12 mm BMS    Medications:  ASA  Coreg  Plavix  Motrin  Imdur  Ntg  Altace  Crestor  Assessment: 72 yo male with chest pain for heparin.  Heparin 3500 units IV bolus given at 0130  Goal of Therapy:  Heparin level 0.3-0.7 units/ml Monitor platelets by anticoagulation protocol: Yes   Plan:  Start heparin 700 units/hr Check heparin level in 6 hours.  Eddie Candle 10/25/2012,4:42 AM

## 2012-10-25 NOTE — Progress Notes (Signed)
  Echocardiogram 2D Echocardiogram has been performed.  [name] 10/25/2012, 5:29 PM

## 2012-10-25 NOTE — ED Provider Notes (Signed)
History     CSN: [id number]  Arrival date & time 10/25/12  0104   First MD Initiated Contact with Patient 10/25/12 0114      Chief Complaint  Patient presents with  . Code STEMI    (Consider location/radiation/quality/duration/timing/severity/associated sxs/prior treatment) HPI 72 year old male presents emergency part from home with complaint of shortness of breath.  Patient does not speak English, translation is done through his daughter.  EKG done in triage shows ST elevation, and the patient was called out as a STEMI.  Patient denies any chest pain.  He has been having shortness of breath ongoing for the last week.  He was seen at his cardiology office this past week, and had medications adjusted.  Daughter reports they did not see Dr. Marina Goodell, but did see his nurse practitioner.  Patient has history of prior MI with stents placed in 2011.  He had a cardiac catheterization done.  This past fall.  Patient denies missing any doses of his medication, no history of congestive heart failure or COPD.  No fevers.  No cough.  No swelling in his legs. Past Medical History  Diagnosis Date  . Hypertension   . Ischemic cardiomyopathy Sept 2011    EF improved to 35-40% 2D  . COPD (chronic obstructive pulmonary disease)   . CVA (cerebral infarction) 2009  . LBBB (left bundle branch block)   . Stroke   . History of acute anterior wall MI 2011    s/p PCI to LAD; 2.25 x 12 mm BMS    Past Surgical History  Procedure Laterality Date  . Coronary stent placement  February 12 2010    LAD BMS    No family history on file.  History  Substance Use Topics  . Smoking status: Former Smoker    Quit date: 03/13/2009  . Smokeless tobacco: Not on file  . Alcohol Use: No      Review of Systems  Unable to perform ROS: Other   language barrier  Allergies  Review of patient's allergies indicates no known allergies.  Home Medications   Current Outpatient Rx  Name  Route  Sig  Dispense  Refill  .  aspirin EC 81 MG EC tablet   Oral   Take 1 tablet (81 mg total) by mouth daily.         . carvedilol (COREG) 3.125 MG tablet   Oral   Take 1 tablet (3.125 mg total) by mouth 2 (two) times daily with a meal.   60 tablet   6   . clopidogrel (PLAVIX) 75 MG tablet   Oral   Take 1 tablet (75 mg total) by mouth daily with breakfast.   30 tablet   11   . ibuprofen (ADVIL,MOTRIN) 200 MG tablet   Oral   Take 200-400 mg by mouth every 6 (six) hours as needed. Depends on pain if takes 200 mg or 400 mg         . isosorbide mononitrate (IMDUR) 30 MG 24 hr tablet   Oral   Take 1 tablet (30 mg total) by mouth daily.   30 tablet   6   . nitroGLYCERIN (NITROSTAT) 0.4 MG SL tablet   Sublingual   Place 1 tablet (0.4 mg total) under the tongue every 5 (five) minutes x 3 doses as needed for chest pain.   25 tablet   4   . ramipril (ALTACE) 10 MG tablet   Oral   Take 10 mg by mouth daily.         Marland Kitchen  rosuvastatin (CRESTOR) 20 MG tablet   Oral   Take 1 tablet (20 mg total) by mouth daily.   30 tablet   6     BP 163/106  Pulse 85  Resp 31  SpO2 100%  Physical Exam  Constitutional: He is oriented to person, place, and time. He appears distressed.  Thin elderly, male in moderate respiratory distress  HENT:  Head: Normocephalic and atraumatic.  Nose: Nose normal.  Mouth/Throat: Oropharynx is clear and moist. No oropharyngeal exudate.  Eyes: Conjunctivae and EOM are normal. Pupils are equal, round, and reactive to light.  Neck: Normal range of motion. Neck supple. No JVD present. No tracheal deviation present. No thyromegaly present.  Cardiovascular: Regular rhythm, normal heart sounds and intact distal pulses.  Exam reveals no gallop and no friction rub.   No murmur heard. tachycardia  Pulmonary/Chest: No stridor. He is in respiratory distress.  Patient using accessory muscles, prolonged expiratory phase.  Rales noted in the bases bilaterally.  Initial oxygen saturation in the  70s  Abdominal: Soft. Bowel sounds are normal. He exhibits no distension and no mass. There is no tenderness. There is no rebound and no guarding.  Musculoskeletal: Normal range of motion. He exhibits no edema and no tenderness.  Lymphadenopathy:    He has no cervical adenopathy.  Neurological: He is alert and oriented to person, place, and time. He exhibits normal muscle tone. Coordination normal.  Skin: Skin is warm. No rash noted. No erythema. No pallor.    ED Course  Procedures (including critical care time) CRITICAL CARE Performed by: Olivia Mackie   Total critical care time:75 min  Critical care time was exclusive of separately billable procedures and treating other patients.  Critical care was necessary to treat or prevent imminent or life-threatening deterioration.  Critical care was time spent personally by me on the following activities: development of treatment plan with patient and/or surrogate as well as nursing, discussions with consultants, evaluation of patient's response to treatment, examination of patient, obtaining history from patient or surrogate, ordering and performing treatments and interventions, ordering and review of laboratory studies, ordering and review of radiographic studies, pulse oximetry and re-evaluation of patient's condition.  Pt with hypoxia, tachycardia, and concerning EKG.  Required frequent monitoring and several IV cardiac medications to correct his tachycardia and htn.  Pt required bipap for his respiratory distress.   Labs Reviewed  CBC - Abnormal; Notable for the following:    Hemoglobin 12.5 (*)    HCT 37.1 (*)    All other components within normal limits  COMPREHENSIVE METABOLIC PANEL - Abnormal; Notable for the following:    Glucose, Bld 157 (*)    BUN 26 (*)    AST 217 (*)    ALT 149 (*)    GFR calc non Af Amer 60 (*)    GFR calc Af Amer 70 (*)    All other components within normal limits  PRO B NATRIURETIC PEPTIDE - Abnormal;  Notable for the following:    Pro B Natriuretic peptide (BNP) 11193.0 (*)    All other components within normal limits  POCT I-STAT, CHEM 8 - Abnormal; Notable for the following:    BUN 28 (*)    Glucose, Bld 159 (*)    All other components within normal limits  POCT I-STAT 3, BLOOD GAS (G3+) - Abnormal; Notable for the following:    pH, Arterial 7.332 (*)    pO2, Arterial 244.0 (*)    Acid-base deficit 4.0 (*)  All other components within normal limits  POCT I-STAT TROPONIN I - Abnormal; Notable for the following:    Troponin i, poc 0.29 (*)    All other components within normal limits  MRSA PCR SCREENING  APTT  PROTIME-INR  HEPARIN LEVEL (UNFRACTIONATED)  BLOOD GAS, ARTERIAL  TROPONIN I  TROPONIN I  TROPONIN I  POCT I-STAT TROPONIN I   Dg Chest Port 1 View  10/25/2012  *RADIOLOGY REPORT*  Clinical Data: Shortness of breath  PORTABLE CHEST - 1 VIEW  Comparison: 03/13/2012  Findings: Emphysematous changes and cardiac enlargement similar previous study.  There is interval development of pulmonary venous congestion with interstitial changes suggesting interstitial edema. No focal consolidation.  No blunting of costophrenic angles.  No pneumothorax.  Calcification of the aorta.  IMPRESSION: Cardiac enlargement with developing pulmonary vascular congestion and interstitial edema since previous study.  Emphysematous changes.   Original Report Authenticated By: Burman Nieves, M.D.     Date: 10/25/2012  Rate: 126  Rhythm: sinus tachycardia  QRS Axis: left  Intervals: normal  ST/T Wave abnormalities: ST elevations anteriorly and ST depressions inferiorly  Conduction Disutrbances:left bundle branch block  Narrative Interpretation:   Old EKG Reviewed: changes noted   Date: 10/25/2012  Rate: 83  Rhythm: normal sinus rhythm  QRS Axis: left  Intervals: normal  ST/T Wave abnormalities: mild st elevations in  v1-3 with ST depressions II, III, AVF  Conduction Disutrbances:left bundle  branch block  Narrative Interpretation:   Old EKG Reviewed: changes noted    1. Hypertensive emergency   2. Dyspnea   3. Hypoxia   4. ST elevation   5. CAD (coronary artery disease)   6. Ischemic cardiomyopathy   7. Unstable angina       MDM  72 yo male who presents with STEMI.  1 week of SOB, reported to have seen his cardiology clinic this week, changed medications.  C/o severe sob, noted to be hypoxic and tachycardic upon arrival.  STEMI called.  Pt improved to 100% on NRB, but still with increased WOB.  Improved after bipap with decrease in HR.  NTG drip started for htn, sob, preload reduction, metoprolol given due to persistent tachycardia, htn.  Improvement in ST elevation with reduction in HR.  To go emergently to the cath lab  Patient was sent back from the cath lab after Dr. Kirke Corin reviewed his EKGs, and did not feel at this time he required an emergent cath.  He requests admission for unstable angina and continued management of his dyspnea and hypertension.  Pt is a [facility] patient, will d/w hospitalist for overnight admission to their service.    D/w Dr Adela Glimpse, who requests second troponin.  Will also add on bnp.  Pt reports improved dyspnea on the bipap.  Will admit to stepdown.          Olivia Mackie, MD 10/25/12 [phone]

## 2012-10-25 NOTE — Consult Note (Signed)
Reason for Consult: Chest pain, respiratory failure  Requesting Physician: Claybon Jabs  HPI: The patient is a 72 year old [place] male with a history of coronary disease. He had an ST-elevation MI in July of 2011. At that time he had a total LAD that was treated with a bare metal stent. He had some moderate residual circumflex and RCA disease treated medically. His ejection fraction initially was 25-30%, this improved to 30-40% by echocardiogram in September of 2011. In March of 2013 he had some more chest pain. We did a Myoview and an echo that were unremarkable, and eventually he came into the hospital with chest pain in August 2013 and was re-studied. At that time it was felt that his LAD stent was patent. There was some residual RCA and OM disease that possibly could be causing him some angina. These were not amenable to intervention and he has been treated medically. He has had problems with compliance unfortunately in the past. When he came in in August he was hypertensive because he had run out of some of his medications. The patient was recently seen by Wilburt Finlay, PA, September 23, 2012 with chest pain again. Judie Grieve did not think his pain was cardiac in nature and added an anti-inflammatory and increased his antihypertensives. The patient was seen 10/21/12 in the office for 66-month followup. He was still complaining of chest pain but it again sounded MS. I did increase his Coreg and suggested a NSAID and a PPI. He presented to the ER last night with chest pain and increasing SOB. He was initially taken to the cath lab as a STEMI but this was aborted as he was pain free and the LBBB was found to be old. He is in the CCU now on Bi pap. His POC is 0.29, BNP 11K.  PMHx:  Past Medical History  Diagnosis Date  . Hypertension   . Ischemic cardiomyopathy Sept 2011    EF improved to 35-40% 2D  . COPD (chronic obstructive pulmonary disease)   . CVA (cerebral infarction) 2009  . LBBB (left  bundle branch block)   . Stroke   . History of acute anterior wall MI 2011    s/p PCI to LAD; 2.25 x 12 mm BMS   Past Surgical History  Procedure Laterality Date  . Coronary stent placement  February 12 2010    LAD BMS    FAMHx: History reviewed. No pertinent family history.  SOCHx:  reports that he quit smoking about 3 years ago. He does not have any smokeless tobacco history on file. He reports that he does not drink alcohol or use illicit drugs.  ALLERGIES: No Known Allergies  ROS: Pertinent items are noted in HPI.  HOME MEDICATIONS: Prescriptions prior to admission  Medication Sig Dispense Refill  . aspirin EC 81 MG EC tablet Take 1 tablet (81 mg total) by mouth daily.      . carvedilol (COREG) 3.125 MG tablet Take 1 tablet (3.125 mg total) by mouth 2 (two) times daily with a meal.  60 tablet  6  . clopidogrel (PLAVIX) 75 MG tablet Take 1 tablet (75 mg total) by mouth daily with breakfast.  30 tablet  11  . ibuprofen (ADVIL,MOTRIN) 200 MG tablet Take 200-400 mg by mouth every 6 (six) hours as needed. Depends on pain if takes 200 mg or 400 mg      . isosorbide mononitrate (IMDUR) 30 MG 24 hr tablet Take 1 tablet (30 mg total) by mouth daily.  30 tablet  6  . nitroGLYCERIN (NITROSTAT) 0.4 MG SL tablet Place 1 tablet (0.4 mg total) under the tongue every 5 (five) minutes x 3 doses as needed for chest pain.  25 tablet  4  . ramipril (ALTACE) 10 MG tablet Take 10 mg by mouth daily.      . rosuvastatin (CRESTOR) 20 MG tablet Take 1 tablet (20 mg total) by mouth daily.  30 tablet  6    HOSPITAL MEDICATIONS: I have reviewed the patient's current medications.  VITALS: Blood pressure 175/106, pulse 87, temperature 97.3 F (36.3 C), temperature source Axillary, resp. rate 16, height 5\' 9"  (1.753 m), weight 54.9 kg (121 lb 0.5 oz), SpO2 100.00%.  PHYSICAL EXAM: General appearance: alert, cooperative, appears older than stated age, cachectic and no distress Neck: no carotid bruit and  no JVD Lungs: basilar crackles Heart: regular rate and rhythm Abdomen: soft non tender, pulsitile liver Extremities: extremities normal, atraumatic, no cyanosis or edema Pulses: 2+ and symmetric Skin: Skin color, texture, turgor normal. No rashes or lesions Neurologic: Grossly normal  LABS: Results for orders placed during the hospital encounter of 10/25/12 (from the past 48 hour(s))  APTT     Status: None   Collection Time    10/25/12  1:11 AM      Result Value Range   aPTT 34  24 - 37 seconds  CBC     Status: Abnormal   Collection Time    10/25/12  1:11 AM      Result Value Range   WBC 6.8  4.0 - 10.5 K/uL   RBC 4.35  4.22 - 5.81 MIL/uL   Hemoglobin 12.5 (*) 13.0 - 17.0 g/dL   HCT 40.9 (*) 81.1 - 91.4 %   MCV 85.3  78.0 - 100.0 fL   MCH 28.7  26.0 - 34.0 pg   MCHC 33.7  30.0 - 36.0 g/dL   RDW 78.2  95.6 - 21.3 %   Platelets 207  150 - 400 K/uL  COMPREHENSIVE METABOLIC PANEL     Status: Abnormal   Collection Time    10/25/12  1:11 AM      Result Value Range   Sodium 140  135 - 145 mEq/L   Potassium 4.4  3.5 - 5.1 mEq/L   Chloride 108  96 - 112 mEq/L   CO2 21  19 - 32 mEq/L   Glucose, Bld 157 (*) 70 - 99 mg/dL   BUN 26 (*) 6 - 23 mg/dL   Creatinine, Ser 0.86  0.50 - 1.35 mg/dL   Calcium 9.1  8.4 - 57.8 mg/dL   Total Protein 7.8  6.0 - 8.3 g/dL   Albumin 3.7  3.5 - 5.2 g/dL   AST 469 (*) 0 - 37 U/L   ALT 149 (*) 0 - 53 U/L   Alkaline Phosphatase 95  39 - 117 U/L   Total Bilirubin 0.3  0.3 - 1.2 mg/dL   GFR calc non Af Amer 60 (*) >90 mL/min   GFR calc Af Amer 70 (*) >90 mL/min   Comment:            The eGFR has been calculated     using the CKD EPI equation.     This calculation has not been     validated in all clinical     situations.     eGFR's persistently     <90 mL/min signify     possible Chronic Kidney Disease.  PROTIME-INR  Status: None   Collection Time    10/25/12  1:11 AM      Result Value Range   Prothrombin Time 13.0  11.6 - 15.2 seconds    INR 0.99  0.00 - 1.49  POCT I-STAT TROPONIN I     Status: None   Collection Time    10/25/12  1:21 AM      Result Value Range   Troponin i, poc 0.06  0.00 - 0.08 ng/mL   Comment 3            Comment: Due to the release kinetics of cTnI,     a negative result within the first hours     of the onset of symptoms does not rule out     myocardial infarction with certainty.     If myocardial infarction is still suspected,     repeat the test at appropriate intervals.  POCT I-STAT, CHEM 8     Status: Abnormal   Collection Time    10/25/12  1:23 AM      Result Value Range   Sodium 144  135 - 145 mEq/L   Potassium 4.4  3.5 - 5.1 mEq/L   Chloride 111  96 - 112 mEq/L   BUN 28 (*) 6 - 23 mg/dL   Creatinine, Ser 1.61  0.50 - 1.35 mg/dL   Glucose, Bld 096 (*) 70 - 99 mg/dL   Calcium, Ion 0.45  4.09 - 1.30 mmol/L   TCO2 23  0 - 100 mmol/L   Hemoglobin 13.3  13.0 - 17.0 g/dL   HCT 81.1  91.4 - 78.2 %  PRO B NATRIURETIC PEPTIDE     Status: Abnormal   Collection Time    10/25/12  3:53 AM      Result Value Range   Pro B Natriuretic peptide (BNP) 11193.0 (*) 0 - 125 pg/mL  POCT I-STAT 3, BLOOD GAS (G3+)     Status: Abnormal   Collection Time    10/25/12  5:49 AM      Result Value Range   pH, Arterial 7.332 (*) 7.350 - 7.450   pCO2 arterial 41.0  35.0 - 45.0 mmHg   pO2, Arterial 244.0 (*) 80.0 - 100.0 mmHg   Bicarbonate 21.7  20.0 - 24.0 mEq/L   TCO2 23  0 - 100 mmol/L   O2 Saturation 100.0     Acid-base deficit 4.0 (*) 0.0 - 2.0 mmol/L   Patient temperature 98.6 F     Collection site RADIAL, ALLEN'S TEST ACCEPTABLE     Drawn by RT     Sample type ARTERIAL    POCT I-STAT TROPONIN I     Status: Abnormal   Collection Time    10/25/12  6:18 AM      Result Value Range   Troponin i, poc 0.29 (*) 0.00 - 0.08 ng/mL   Comment 3            Comment: Due to the release kinetics of cTnI,     a negative result within the first hours     of the onset of symptoms does not rule out      myocardial infarction with certainty.     If myocardial infarction is still suspected,     repeat the test at appropriate intervals.  MRSA PCR SCREENING     Status: None   Collection Time    10/25/12  6:34 AM      Result Value Range   MRSA by  PCR NEGATIVE  NEGATIVE   Comment:            The GeneXpert MRSA Assay (FDA     approved for NASAL specimens     only), is one component of a     comprehensive MRSA colonization     surveillance program. It is not     intended to diagnose MRSA     infection nor to guide or     monitor treatment for     MRSA infections.    IMAGING: Dg Chest Port 1 View  10/25/2012  *RADIOLOGY REPORT*  Clinical Data: Shortness of breath  PORTABLE CHEST - 1 VIEW  Comparison: 03/13/2012  Findings: Emphysematous changes and cardiac enlargement similar previous study.  There is interval development of pulmonary venous congestion with interstitial changes suggesting interstitial edema. No focal consolidation.  No blunting of costophrenic angles.  No pneumothorax.  Calcification of the aorta.  IMPRESSION: Cardiac enlargement with developing pulmonary vascular congestion and interstitial edema since previous study.  Emphysematous changes.   Original Report Authenticated By: Burman Nieves, M.D.    EKG- NSR LBBB  IMPRESSION: Principal Problem:   Unstable angina Active Problems:   CAD, STEMI July 2011 Rx'd with LAD BMS with moderate residual RCA/CFX disease. Cath 8/13- patent LAD   Hypertensive urgency   Acute systolic CHF (congestive heart failure)   Acute respiratory failure- Bi pap on admission   Ischemic cardiomyopathy, EF 25-30% with STEMI, improved to 35-40% 2D Sept 2011   HYPERLIPIDEMIA   LOSS OF APPETITE   LBBB (left bundle branch block)   COPD (chronic obstructive pulmonary disease) by history and CXR   History of CVA 2009   Elevated LFTs   RECOMMENDATION: MD to see. Cycle Troponin, add Lasix, Rx B/P with PRN Hydralazine. Add Norvasc.  Increase Coreg to  his home dose (6.25mg  BID). Continue ACE.  Hold Imdur while on IV NTG.  Echo ordered.  Abd [place] ordered secondary to elevated LFTs. We can take on our service.  Time Spent Directly with Patient: 45 minutes  KILROY,LUKE K 10/25/2012, 8:42 AM    I have seen and examined the patient along with Abelino Derrick, PA.  I have reviewed the chart, notes and new data.  I agree with PA's note.  Key new complaints: breathing greatly improved, no angina Key examination changes: +S3, no JVD, severely elevated BP Key new findings / data: BNP much higher than baseline; TnI nominally abnormal, likely secondary to CHF, not ACS. Normal renal function  PLAN: Translation by daughter. Family reports compliance with meds, but marked and persistent BP elevation suggests beta blocker withdrawal and elevated AST/ALT is consistent with hepatic congestion. Alternatively, this BP elevation may be a marker of renal artery stenosis. Check renal artery Dopplers if never performed as an outpatient. Recheck echo - suspect severe TR explains pulsatile liver. Note LDL still high, this also raises concerns re: medication compliance. BP control is primary goal at this time.  Thurmon Fair, MD, [facility] [phone] 10/25/2012, 9:16 AM

## 2012-10-25 NOTE — ED Notes (Signed)
Pt transported to cath lab with rapid response RN and Cardiac Fellow.  Upon arrival to cath lab, decision was made to no perform cardiac cath tonight.  Pt was then returned to ED to await admission by hospitalists.

## 2012-10-25 NOTE — ED Notes (Signed)
Pt in bed resting with family at bedside.  Family made aware of upcoming admission and process explained to family.  No wants or needs at this time by family or pt

## 2012-10-25 NOTE — ED Notes (Signed)
Per pt's family:  Pt presents with chest pain and shortness of breath for the past 2 weeks, getting worse tonight.  Pt has hx of MI.

## 2012-10-25 NOTE — Progress Notes (Signed)
ANTICOAGULATION CONSULT NOTE - Follow Up Consult  Pharmacy Consult for heparin Indication: NSTEMI  No Known Allergies  Patient Measurements: Height: 5\' 9"  (175.3 cm) Weight: 121 lb 0.5 oz (54.9 kg) IBW/kg (Calculated) : 70.7 Heparin Dosing Weight: 55 kg  Vital Signs: Temp: 98.1 F (36.7 C) (04/06 1920) Temp src: Oral (04/06 1920) BP: 112/67 mmHg (04/06 2000) Pulse Rate: 79 (04/06 2000)  Labs:  Recent Labs  10/25/12 0111 10/25/12 0123 10/25/12 1108 10/25/12 1710 10/25/12 2104  HGB 12.5* 13.3  --   --   --   HCT 37.1* 39.0  --   --   --   PLT 207  --   --   --   --   APTT 34  --   --   --   --   LABPROT 13.0  --   --   --   --   INR 0.99  --   --   --   --   HEPARINUNFRC  --   --  0.19*  --  0.46  CREATININE 1.17 1.10  --   --   --   TROPONINI  --   --  <0.30 0.34*  --     Estimated Creatinine Clearance: 46.4 ml/min (by C-G formula based on Cr of 1.1).   Medications:  Prescriptions prior to admission  Medication Sig Dispense Refill  . carvedilol (COREG) 6.25 MG tablet Take 6.25 mg by mouth 2 (two) times daily with a meal.      . clopidogrel (PLAVIX) 75 MG tablet Take 1 tablet (75 mg total) by mouth daily with breakfast.  30 tablet  11  . ibuprofen (ADVIL,MOTRIN) 200 MG tablet Take 200 mg by mouth 2 (two) times daily.      . isosorbide mononitrate (IMDUR) 60 MG 24 hr tablet Take 60 mg by mouth daily.      . nitroGLYCERIN (NITROSTAT) 0.4 MG SL tablet Place 1 tablet (0.4 mg total) under the tongue every 5 (five) minutes x 3 doses as needed for chest pain.  25 tablet  4  . omeprazole (PRILOSEC OTC) 20 MG tablet Take 20 mg by mouth daily as needed.      . ramipril (ALTACE) 10 MG capsule Take 20 mg by mouth daily.      . rosuvastatin (CRESTOR) 20 MG tablet Take 1 tablet (20 mg total) by mouth daily.  30 tablet  6    Assessment: 72 y/o male patient on heparin for NSTEMI. Xa level is therapeutic, continue current rate. Troponin level continues to increase.   Goal of  Therapy:  Heparin level 0.3-0.7 units/ml Monitor platelets by anticoagulation protocol: Yes   Plan:  Continue heparin gtt at 850 units/hr and f/u in am.  Verlene Mayer, PharmD, BCPS Pager [phone] 10/25/2012 10:04 PM

## 2012-10-25 NOTE — Progress Notes (Signed)
ANTICOAGULATION CONSULT NOTE - Follow Up Consult  Pharmacy Consult for heparin Indication: NSTEMI  No Known Allergies  Patient Measurements: Height: 5\' 9"  (175.3 cm) Weight: 121 lb 0.5 oz (54.9 kg) IBW/kg (Calculated) : 70.7 Heparin Dosing Weight: 55 kg  Vital Signs: Temp: 97.3 F (36.3 C) (04/06 0800) Temp src: Axillary (04/06 0800) BP: 175/106 mmHg (04/06 0800) Pulse Rate: 87 (04/06 0800)  Labs:  Recent Labs  10/25/12 0111 10/25/12 0123 10/25/12 1108  HGB 12.5* 13.3  --   HCT 37.1* 39.0  --   PLT 207  --   --   APTT 34  --   --   LABPROT 13.0  --   --   INR 0.99  --   --   HEPARINUNFRC  --   --  0.19*  CREATININE 1.17 1.10  --     Estimated Creatinine Clearance: 46.4 ml/min (by C-G formula based on Cr of 1.1).   Medications:  Prescriptions prior to admission  Medication Sig Dispense Refill  . aspirin EC 81 MG EC tablet Take 1 tablet (81 mg total) by mouth daily.      . carvedilol (COREG) 3.125 MG tablet Take 1 tablet (3.125 mg total) by mouth 2 (two) times daily with a meal.  60 tablet  6  . clopidogrel (PLAVIX) 75 MG tablet Take 1 tablet (75 mg total) by mouth daily with breakfast.  30 tablet  11  . ibuprofen (ADVIL,MOTRIN) 200 MG tablet Take 200-400 mg by mouth every 6 (six) hours as needed. Depends on pain if takes 200 mg or 400 mg      . isosorbide mononitrate (IMDUR) 30 MG 24 hr tablet Take 1 tablet (30 mg total) by mouth daily.  30 tablet  6  . nitroGLYCERIN (NITROSTAT) 0.4 MG SL tablet Place 1 tablet (0.4 mg total) under the tongue every 5 (five) minutes x 3 doses as needed for chest pain.  25 tablet  4  . ramipril (ALTACE) 10 MG tablet Take 10 mg by mouth daily.      . rosuvastatin (CRESTOR) 20 MG tablet Take 1 tablet (20 mg total) by mouth daily.  30 tablet  6    Assessment: 72 y/oM on heparin for NSTEMI.  Heparin level this morning was subtherapeutic at 0.19.  CBC stable, renal fxn ok. No bleeding noted.   Goal of Therapy:  Heparin level 0.3-0.7  units/ml Monitor platelets by anticoagulation protocol: Yes   Plan:  Bolus heparin 1500 units Increase heparin drip to 850 units/hr 8 hr heparin level Daily CBC, heparin level  Doris Cheadle, PharmD Clinical Pharmacist Pager: [phone] Phone: [phone] 10/25/2012 11:58 AM

## 2012-10-25 NOTE — H&P (Signed)
PCP:  Runell Gess, MD    Chief Complaint:  Shortness of breath HPI: [name] is a 72 y.o. male   has a past medical history of Hypertension; Ischemic cardiomyopathy (Sept 2011); COPD (chronic obstructive pulmonary disease); CVA (cerebral infarction) (2009); LBBB (left bundle branch block); Stroke; and History of acute anterior wall MI (2011).   Presented with  Patient called daughter and stated he could not breathe. Denied any chest pain but stated that he's been having intermittent episodes of severe shortness of breath have seen his doctor for this and was told to increase his regular medications. This patient was severely short of breath with EKG showing a bundle branch block he was inially seen as a code STEMI and taken to cath lab but given no active chest pain this was canceled. [place] was found that he has history of chronic bundle branch block. He did much better once on BiPAP and currently feeling improved. His initial BP was elevated in 212/139. He is currently on nitroglycerine gtt with improved blood pressure. Heparin gtt has also been imitated given known hx of CAD and possible unstable angina. Hospitalist was called to admit patient.  Family denies any cough he had initially some wheezing that now has resolved.   He does have known history of diastolic heart failure as well as coronary artery disease status post MI and stent. Last cardiac catheterization was done in August 2013.  Of note patient does have limited nondistended of English but mainly speaks [place] Review of Systems:    Pertinent positives include: shortness of breath at rest.  Constitutional:  No weight loss, night sweats, Fevers, chills, fatigue, weight loss  HEENT:  No headaches, Difficulty swallowing,Tooth/dental problems,Sore throat,  No sneezing, itching, ear ache, nasal congestion, post nasal drip,  Cardio-vascular:  No Orthopnea, PND, anasarca, dizziness, palpitations.no Bilateral lower extremity  swelling  GI:  No heartburn, indigestion, abdominal pain, nausea, vomiting, diarrhea, change in bowel habits, loss of appetite, melena, blood in stool, hematemesis Resp:  no No dyspnea on exertion, No excess mucus, no productive cough, No non-productive cough, No coughing up of blood.No change in color of mucus.No wheezing. Skin:  no rash or lesions. No jaundice GU:  no dysuria, change in color of urine, no urgency or frequency. No straining to urinate.  No flank pain.  Musculoskeletal:  No joint pain or no joint swelling. No decreased range of motion. No back pain.  Psych:  No change in mood or affect. No depression or anxiety. No memory loss.  Neuro: no localizing neurological complaints, no tingling, no weakness, no double vision, no gait abnormality, no slurred speech, no confusion  Otherwise ROS are negative except for above, 10 systems were reviewed  Past Medical History: Past Medical History  Diagnosis Date  . Hypertension   . Ischemic cardiomyopathy Sept 2011    EF improved to 35-40% 2D  . COPD (chronic obstructive pulmonary disease)   . CVA (cerebral infarction) 2009  . LBBB (left bundle branch block)   . Stroke   . History of acute anterior wall MI 2011    s/p PCI to LAD; 2.25 x 12 mm BMS   Past Surgical History  Procedure Laterality Date  . Coronary stent placement  February 12 2010    LAD BMS     Medications: Prior to Admission medications   Medication Sig Start Date End Date Taking? Authorizing Provider  aspirin EC 81 MG EC tablet Take 1 tablet (81 mg total) by mouth daily. 03/14/12  03/14/13  Nada Boozer, NP  carvedilol (COREG) 3.125 MG tablet Take 1 tablet (3.125 mg total) by mouth 2 (two) times daily with a meal. 03/14/12 03/14/13  Nada Boozer, NP  clopidogrel (PLAVIX) 75 MG tablet Take 1 tablet (75 mg total) by mouth daily with breakfast. 03/14/12 03/14/13  Nada Boozer, NP  ibuprofen (ADVIL,MOTRIN) 200 MG tablet Take 200-400 mg by mouth every 6 (six) hours as  needed. Depends on pain if takes 200 mg or 400 mg    Historical Provider, MD  isosorbide mononitrate (IMDUR) 30 MG 24 hr tablet Take 1 tablet (30 mg total) by mouth daily. 03/14/12 03/14/13  Nada Boozer, NP  nitroGLYCERIN (NITROSTAT) 0.4 MG SL tablet Place 1 tablet (0.4 mg total) under the tongue every 5 (five) minutes x 3 doses as needed for chest pain. 03/14/12 03/14/13  Nada Boozer, NP  ramipril (ALTACE) 10 MG tablet Take 10 mg by mouth daily.    Historical Provider, MD  rosuvastatin (CRESTOR) 20 MG tablet Take 1 tablet (20 mg total) by mouth daily. 03/14/12 03/14/13  Nada Boozer, NP    Allergies:  No Known Allergies  Social History:  Ambulatory  independently  Lives at home with wife   reports that he quit smoking about 3 years ago. He does not have any smokeless tobacco history on file. He reports that he does not drink alcohol or use illicit drugs.   Family History: family history is not on file.    Physical Exam: Patient Vitals for the past 24 hrs:  BP Pulse Resp SpO2  10/25/12 0145 163/106 mmHg 85 31 100 %  10/25/12 0144 158/93 mmHg 98 33 100 %  10/25/12 0142 195/125 mmHg 109 33 100 %  10/25/12 0140 201/128 mmHg 112 34 100 %  10/25/12 0139 205/124 mmHg 113 34 100 %  10/25/12 0138 187/115 mmHg 114 30 100 %  10/25/12 0134 212/139 mmHg 120 32 100 %  10/25/12 0124 178/126 mmHg - - -  10/25/12 0117 178/126 mmHg - 28 97 %  10/25/12 0111 180/110 mmHg 123 28 80 %    1. General:  in No Acute distress 2. Psychological: Alert and   Oriented 3. Head/ENT:   Moist   Mucous Membranes, BiPAP in place                          Head Non traumatic, neck supple                          Normal  Dentition 4. SKIN: normal  Skin turgor,  Skin clean Dry and intact no rash 5. Heart: Regular rate and rhythm systolic Murmur noted, known Rub or gallop 6. Lungs: Occasional crackles  no wheezes 7. Abdomen: Soft, non-tender, Non distended 8. Lower extremities: no clubbing, cyanosis, or edema 9.  Neurologically Grossly intact, moving all 4 extremities equally 10. MSK: Normal range of motion  body mass index is unknown because there is no weight on file.   Labs on Admission:   Recent Labs  10/25/12 0111 10/25/12 0123  NA 140 144  K 4.4 4.4  CL 108 111  CO2 21  --   GLUCOSE 157* 159*  BUN 26* 28*  CREATININE 1.17 1.10  CALCIUM 9.1  --     Recent Labs  10/25/12 0111  AST 217*  ALT 149*  ALKPHOS 95  BILITOT 0.3  PROT 7.8  ALBUMIN 3.7   No results  found for this basename: LIPASE, AMYLASE,  in the last 72 hours  Recent Labs  10/25/12 0111 10/25/12 0123  WBC 6.8  --   HGB 12.5* 13.3  HCT 37.1* 39.0  MCV 85.3  --   PLT 207  --    No results found for this basename: CKTOTAL, CKMB, CKMBINDEX, TROPONINI,  in the last 72 hours No results found for this basename: TSH, T4TOTAL, FREET3, T3FREE, THYROIDAB,  in the last 72 hours No results found for this basename: VITAMINB12, FOLATE, FERRITIN, TIBC, IRON, RETICCTPCT,  in the last 72 hours Lab Results  Component Value Date   HGBA1C  Value: 5.6 (NOTE)                                                                       According to the ADA Clinical Practice Recommendations for 2011, when HbA1c is used as a screening test:   >=6.5%   Diagnostic of Diabetes Mellitus           (if abnormal result  is confirmed)  5.7-6.4%   Increased risk of developing Diabetes Mellitus  References:Diagnosis and Classification of Diabetes Mellitus,Diabetes Care,2011,34(Suppl 1):S62-S69 and Standards of Medical Care in         Diabetes - 2011,Diabetes Care,2011,34  (Suppl 1):S11-S61. 02/12/2010    The CrCl is unknown because both a height and weight (above a minimum accepted value) are required for this calculation. ABG    Component Value Date/Time   TCO2 23 10/25/2012 0123     No results found for this basename: DDIMER     Other results:  I have pearsonaly reviewed this: ECG REPORT  Rate: 131  Rhythm: LBBB ST&T Change: n/a  BNP  11193.0 (H)   Cultures: No results found for this basename: sdes, specrequest, cult, reptstatus       Radiological Exams on Admission: Dg Chest Port 1 View  10/25/2012  *RADIOLOGY REPORT*  Clinical Data: Shortness of breath  PORTABLE CHEST - 1 VIEW  Comparison: 03/13/2012  Findings: Emphysematous changes and cardiac enlargement similar previous study.  There is interval development of pulmonary venous congestion with interstitial changes suggesting interstitial edema. No focal consolidation.  No blunting of costophrenic angles.  No pneumothorax.  Calcification of the aorta.  IMPRESSION: Cardiac enlargement with developing pulmonary vascular congestion and interstitial edema since previous study.  Emphysematous changes.   Original Report Authenticated By: Burman Nieves, M.D.     Chart has been reviewed  Assessment/Plan  And 72 year old gentleman known history of coronary artery disease presents with shortness of breath evidence of fluid overload on chest x-ray and hypertensive urgency now BiPAP. Shortness of breath could be his Unstable angina Equivalent   Present on Admission:  . Unstable angina - will heparinize,  monitor and step down continue cycle cardiac markers U. EKG obtain echo gram patient of Dr. Allyson Sabal with [facility] cardiology they would need to be notified in a.m. the patient is here. He has been seen by the bowel cardiology fellow overnight will father currently there was no acute indication for cardiac catheterization.  . Ischemic cardiomyopathy, EF 25-30% with STEMI, improved to 35-40% 2D Sept 2011 will repeat echo gram continue home medications currently she is improved while on BiPAP will hold off  on further Lasix .  Marland Kitchen Hypertensive urgency - nitroglycerin drip and will watch and step down  . COPD (chronic obstructive pulmonary disease) by history and CXR -  we'll make sure patient is on inhalers as needed  . Elevated LFTs - Possibly secondary to hepatic congestion due to  a heart failure but will obtain right upper quadrant ultrasound    Prophylaxis: heparin Protonix  CODE STATUS: FULL CODE  Other plan as per orders.  I have spent a total of 60 min on this admission  Millissa Deese 10/25/2012, 4:21 AM

## 2012-10-26 DIAGNOSIS — I739 Peripheral vascular disease, unspecified: Secondary | ICD-10-CM | POA: Diagnosis present

## 2012-10-26 LAB — CBC
Hemoglobin: 12 g/dL — ABNORMAL LOW (ref 13.0–17.0)
RBC: 4.24 MIL/uL (ref 4.22–5.81)
WBC: 5.6 10*3/uL (ref 4.0–10.5)

## 2012-10-26 LAB — BASIC METABOLIC PANEL
CO2: 26 mEq/L (ref 19–32)
Chloride: 105 mEq/L (ref 96–112)
GFR calc non Af Amer: 52 mL/min — ABNORMAL LOW (ref >90)
Glucose, Bld: 91 mg/dL (ref 70–99)
Potassium: 3.9 mEq/L (ref 3.5–5.1)
Sodium: 139 mEq/L (ref 135–145)

## 2012-10-26 LAB — HEPARIN LEVEL (UNFRACTIONATED): Heparin Unfractionated: 0.52 IU/mL (ref 0.30–0.70)

## 2012-10-26 LAB — TROPONIN I: Troponin I: 0.31 ng/mL (ref <0.30)

## 2012-10-26 LAB — TSH: TSH: 1.764 u[IU]/mL (ref 0.350–4.500)

## 2012-10-26 MED ORDER — KETOROLAC TROMETHAMINE 30 MG/ML IJ SOLN
30.0000 mg | Freq: Once | INTRAMUSCULAR | Status: AC
  Administered 2012-10-26: 30 mg via INTRAVENOUS
  Filled 2012-10-26: qty 1

## 2012-10-26 MED ORDER — ISOSORBIDE MONONITRATE ER 30 MG PO TB24
30.0000 mg | ORAL_TABLET | Freq: Every day | ORAL | Status: DC
  Administered 2012-10-26 – 2012-10-27 (×2): 30 mg via ORAL
  Filled 2012-10-26 (×3): qty 1

## 2012-10-26 NOTE — Progress Notes (Signed)
ANTICOAGULATION CONSULT NOTE - Follow Up Consult  Pharmacy Consult for Heparin Indication: chest pain/ACS  No Known Allergies  Patient Measurements: Height: 5\' 9"  (175.3 cm) Weight: 117 lb 4.6 oz (53.2 kg) IBW/kg (Calculated) : 70.7 Heparin Dosing Weight:    Vital Signs: Temp: 99.3 F (37.4 C) (04/07 0725) Temp src: Oral (04/07 0725) BP: 113/56 mmHg (04/07 0800) Pulse Rate: 51 (04/07 0800)  Labs:  Recent Labs  10/25/12 0111 10/25/12 0123  10/25/12 1108 10/25/12 1710 10/25/12 2104 10/25/12 2249 10/26/12 0512  HGB 12.5* 13.3  --   --   --   --   --  12.0*  HCT 37.1* 39.0  --   --   --   --   --  34.9*  PLT 207  --   --   --   --   --   --  197  APTT 34  --   --   --   --   --   --   --   LABPROT 13.0  --   --   --   --   --   --   --   INR 0.99  --   --   --   --   --   --   --   HEPARINUNFRC  --   --   --  0.19*  --  0.46  --  0.52  CREATININE 1.17 1.10  --   --   --   --   --  1.31  TROPONINI  --   --   < > <0.30 0.34*  --  <0.30 0.31*  < > = values in this interval not displayed.  Estimated Creatinine Clearance: 37.8 ml/min (by C-G formula based on Cr of 1.31).   Assessment: Chest pain. NSTEMI, ProBNP >11,000.  Anticoagulation: Heparin for ACS (dosing weight 55 kg). Still c/o some CP. HL 0.52 in goal.  Infectious Disease: WBC wnl. Afeb 99.3. No abx.   Cardiovascular CAD s/p PCIs, HTN, CHF, PVD, AAA, HLD, LBBB. BP improved 113/56 but HR 51-93. Meds: amlodipine 5mg , ASA 81mg ,Coreg, Plavix, IV Lasix, Altace  Endocrinology: serum gluc 91  Gastrointestinal / Nutrition: AST/ALT elevated. INR at baseline 0.99. No appetite. Cachectic.  Neurology: h/o CVA '09  Nephrology: SCr 1.31. Lytes ok.  Pulmonary:COPD  Hematology / Oncology: H/H little low 12. Plts ok  PTA Medication Issues: imdur  Best Practices: heparin, PPI, statin (elevated LFT's)  Goal of Therapy:  Heparin level 0.3-0.7 units/ml Monitor platelets by anticoagulation protocol: Yes   Plan:   Continue heparin at 850 units/hr   [name] S. [name], PharmD, BCPS Clinical Staff Pharmacist Pager [phone]  [name] 10/26/2012,10:12 AM

## 2012-10-26 NOTE — Care Management Note (Addendum)
    Page 1 of 2   10/30/2012     11:48:21 AM   CARE MANAGEMENT NOTE 10/30/2012  Patient:  [name]   Account Number:  [id number]  Date Initiated:  10/26/2012  Documentation initiated by:  Junius Creamer  Subjective/Objective Assessment:   adm w angina, mi     Action/Plan:   lives w wife, pcp drjohnathan berry   Anticipated DC Date:     Anticipated DC Plan:  HOME W HOME HEALTH SERVICES      DC Planning Services  CM consult      [facility] Choice  HOME HEALTH   Choice offered to / List presented to:  C-4 Adult Children        HH arranged  HH-1 RN  HH-10 DISEASE MANAGEMENT      HH agency  [facility]   Status of service:   Medicare Important Message given?   (If response is "NO", the following Medicare IM given date fields will be blank) Date Medicare IM given:   Date Additional Medicare IM given:    Discharge Disposition:  HOME W HOME HEALTH SERVICES  Per UR Regulation:  Reviewed for med. necessity/level of care/duration of stay  If discussed at Long Length of Stay Meetings, dates discussed:    Comments:  4/11 1146a debbie [name] Nakamura rn,bsn spoke w pt-wife-da. hx of hhc but don't remember name of agency. they would like hhrn to ck on pt. went over list of agencies and no pref. ref to [facility] w [facility] for hhrn for chf followup. pt transf to 4700 today.  4/7 0852 debbie [name] Brophy rn,bsn

## 2012-10-26 NOTE — Progress Notes (Signed)
RT checked on pt x2 throughout the night. Pt in no distress and vitals WNL. No Bipap needed at this time.

## 2012-10-26 NOTE — Progress Notes (Signed)
Assisted to the chair, tolerated well, bp -84/56. Still with slight chest discomfort., nitro gtt d/ced. Dr.Harding  made aware.continue to monitor.

## 2012-10-26 NOTE — Progress Notes (Signed)
Subjective:  Still c/o some chest pain. He is not SOB  Objective:  Vital Signs in the last 24 hours: Temp:  [98 F (36.7 C)-99.3 F (37.4 C)] 99.3 F (37.4 C) (04/07 0725) Pulse Rate:  [72-99] 93 (04/07 0725) Resp:  [15-34] 21 (04/07 0725) BP: (100-179)/(55-109) 107/82 mmHg (04/07 0725) SpO2:  [96 %-100 %] 100 % (04/07 0725) Weight:  [53.2 kg (117 lb 4.6 oz)] 53.2 kg (117 lb 4.6 oz) (04/07 0500)  Intake/Output from previous day:  Intake/Output Summary (Last 24 hours) at 10/26/12 0826 Last data filed at 10/26/12 0700  Gross per 24 hour  Intake 1189.5 ml  Output   1775 ml  Net -585.5 ml    Physical Exam: General appearance: alert, cooperative, cachectic and no distress Lungs: Crackles Lt base Heart: regular rate and rhythm   Rate: 90  Rhythm: normal sinus rhythm  Lab Results:  Recent Labs  10/25/12 0111 10/25/12 0123 10/26/12 0512  WBC 6.8  --  5.6  HGB 12.5* 13.3 12.0*  PLT 207  --  197    Recent Labs  10/25/12 0111 10/25/12 0123 10/26/12 0512  NA 140 144 139  K 4.4 4.4 3.9  CL 108 111 105  CO2 21  --  26  GLUCOSE 157* 159* 91  BUN 26* 28* 29*  CREATININE 1.17 1.10 1.31    Recent Labs  10/25/12 2249 10/26/12 0512  TROPONINI <0.30 0.31*   Hepatic Function Panel  Recent Labs  10/25/12 0111  PROT 7.8  ALBUMIN 3.7  AST 217*  ALT 149*  ALKPHOS 95  BILITOT 0.3   No results found for this basename: CHOL,  in the last 72 hours  Recent Labs  10/25/12 0111  INR 0.99    Imaging: Imaging results have been reviewed  Cardiac Studies:  Assessment/Plan:   Principal Problem:   Acute respiratory failure- Bi pap on admission  Active Problems:   Unstable angina   Hypertensive urgency   Acute combined systolic and diastolic congestive heart failure   CAD, STEMI July 2011 Rx'd with LAD BMS with moderate residual RCA/CFX disease. Cath 8/13- patent LAD   Ischemic cardiomyopathy, EF 25-30% with STEMI, improved to 35-40% 2D Sept 2011   PVD,  AAA 4.2 X 4.1cm by [place] 10/25/12   HYPERLIPIDEMIA   LOSS OF APPETITE   LBBB (left bundle branch block)   COPD by history and CXR   History of CVA 2009   Elevated LFTs secondary to Rt Ht failure   Plan- B/P improved on current meds. Norvasc is new. ? Compliance with medications at home. He continues to C/O of chest pain, difficult to tell if this is anginal or not. He is on IV Heparin and NTG. He did have moderate residual disease at cath 8/13. Slightly elevated Troponin (0.34) could be from accelerated HTN as well as angina. Echo done, results pending. Will hold Lipitor in setting of elevated LFTs, resume as these improve. Continue IV Lasix, BNP still 10K.   Corine Shelter PA-C 10/26/2012, 8:26 AM   I have seen and evaluated the patient this AM along with Corine Shelter, PA. I agree with his findings, examination as well as impression recommendations.  Unfortunately, his daughter was not present when I came to see him.  I was unable to obtain a subjective history.   He looks comfortable & HR/BP has improved dramatically.  I think we can wean off NTG Gtt to PO Imdur as long as BP is stable.  With his continued  CP, I think a non-invasive evaluation may well be warranted as he does have an LAD stent with small OM & rPDA disease -- Lexiscan Myoview in AM may be helpful to determine if he indeed has macrovascular ischemia.  Would keep on heparin at least for tonight.    Pulsatile abdomen, may simply be due to thin body habitus, but agree that we need to look for Renal-Aortic dopplers as OP & if not done, need to check.  BNP is still elevated, continue diuresis, CP may simply be due to increased filling pressures.   [name], M.D., M.S. [facility] [address] [place], [place]  [address]  [phone] Pager # [phone] 10/26/2012 8:49 AM

## 2012-10-27 ENCOUNTER — Inpatient Hospital Stay (HOSPITAL_COMMUNITY): Payer: Medicare PPO

## 2012-10-27 DIAGNOSIS — I150 Renovascular hypertension: Secondary | ICD-10-CM

## 2012-10-27 LAB — BASIC METABOLIC PANEL
BUN: 37 mg/dL — ABNORMAL HIGH (ref 6–23)
Calcium: 9.2 mg/dL (ref 8.4–10.5)
GFR calc non Af Amer: 42 mL/min — ABNORMAL LOW (ref >90)
Glucose, Bld: 84 mg/dL (ref 70–99)

## 2012-10-27 LAB — CBC
HCT: 35.9 % — ABNORMAL LOW (ref 39.0–52.0)
Hemoglobin: 12.2 g/dL — ABNORMAL LOW (ref 13.0–17.0)
MCH: 28.4 pg (ref 26.0–34.0)
MCHC: 34 g/dL (ref 30.0–36.0)
MCV: 83.7 fL (ref 78.0–100.0)

## 2012-10-27 MED ORDER — FUROSEMIDE 10 MG/ML IJ SOLN
40.0000 mg | Freq: Every day | INTRAMUSCULAR | Status: DC
  Administered 2012-10-28: 40 mg via INTRAVENOUS
  Filled 2012-10-27 (×2): qty 4

## 2012-10-27 MED ORDER — REGADENOSON 0.4 MG/5ML IV SOLN
0.4000 mg | Freq: Once | INTRAVENOUS | Status: AC
  Administered 2012-10-27: 0.4 mg via INTRAVENOUS
  Filled 2012-10-27: qty 5

## 2012-10-27 MED ORDER — TECHNETIUM TC 99M SESTAMIBI GENERIC - CARDIOLITE
10.0000 | Freq: Once | INTRAVENOUS | Status: AC | PRN
  Administered 2012-10-27: 10 via INTRAVENOUS

## 2012-10-27 MED ORDER — TECHNETIUM TC 99M SESTAMIBI GENERIC - CARDIOLITE
30.0000 | Freq: Once | INTRAVENOUS | Status: AC | PRN
  Administered 2012-10-27: 30 via INTRAVENOUS

## 2012-10-27 NOTE — Progress Notes (Signed)
ANTICOAGULATION CONSULT NOTE - Follow Up Consult  Pharmacy Consult for Heparin Indication: chest pain/ACS  No Known Allergies  Patient Measurements: Height: 5\' 9"  (175.3 cm) Weight: 110 lb 7.2 oz (50.1 kg) IBW/kg (Calculated) : 70.7 Heparin Dosing Weight:    Vital Signs: Temp: 97.8 F (36.6 C) (04/08 0753) Temp src: Oral (04/08 0753) BP: 132/84 mmHg (04/08 0800) Pulse Rate: 83 (04/08 0800)  Labs:  Recent Labs  10/25/12 0111 10/25/12 0123  10/25/12 2104  10/26/12 0512  10/26/12 1710 10/27/12 10/27/12 0451 10/27/12 0500  HGB 12.5* 13.3  --   --   --  12.0*  --   --   --   --  12.2*  HCT 37.1* 39.0  --   --   --  34.9*  --   --   --   --  35.9*  PLT 207  --   --   --   --  197  --   --   --   --  201  APTT 34  --   --   --   --   --   --   --   --   --   --   LABPROT 13.0  --   --   --   --   --   --   --   --   --   --   INR 0.99  --   --   --   --   --   --   --   --   --   --   HEPARINUNFRC  --   --   < > 0.46  --  0.52  --   --   --   --  0.58  CREATININE 1.17 1.10  --   --   --  1.31  --   --   --   --  1.58*  TROPONINI  --   --   < >  --   < > 0.31*  < > <0.30 <0.30 <0.30  --   < > = values in this interval not displayed.  Estimated Creatinine Clearance: 29.5 ml/min (by C-G formula based on Cr of 1.58).  . sodium chloride 10 mL/hr at 10/25/12 2200  . heparin 850 Units/hr (10/26/12 0550)  . [DISCONTINUED] nitroGLYCERIN 30 mcg/min (10/26/12 0550)    72 yo male on IV heparin for chest pain.  Heparin level therapeutic this morning on 850 units/hr.  No bleeding or complications noted.  Minor anemia, but otherwise CBC stable.    Goal of Therapy:  Heparin level 0.3-0.7 units/ml Monitor platelets by anticoagulation protocol: Yes   Plan:  1. Continue IV heparin at current rate of 850 units/hr. 2. Continue daily heparin level and CBC. 3. F/U plans for further workup and duration of IV heparin.  Tad Moore, BCPS  Clinical Pharmacist Pager [phone]  10/27/2012 9:52 AM

## 2012-10-27 NOTE — Progress Notes (Signed)
*  PRELIMINARY RESULTS* Vascular Ultrasound Renal Artery Duplex has been completed.  Preliminary findings: No evidence of renal artery stenosis bilaterally.  Incidental finding:  Infrarenal abdominal aortic aneurysm measuring 3.28 cm, doubling in size from segment proximal and distal.  Farrel Demark, RDMS, RVT  10/27/2012, 12:59 PM

## 2012-10-27 NOTE — Progress Notes (Signed)
I called the office, no renal dopplers done.  Corine Shelter PA-C 10/27/2012 11:03 AM

## 2012-10-27 NOTE — Progress Notes (Signed)
[facility]  Subjective: CP yesterday but none currently.   Objective: Vital signs in last 24 hours: Temp:  [97.9 F (36.6 C)-98.4 F (36.9 C)] 98 F (36.7 C) (04/08 0400) Pulse Rate:  [51-95] 77 (04/08 0400) Resp:  [12-25] 20 (04/08 0400) BP: (86-153)/(48-98) 111/53 mmHg (04/08 0400) SpO2:  [98 %-100 %] 98 % (04/08 0400) Weight:  [50.1 kg (110 lb 7.2 oz)] 50.1 kg (110 lb 7.2 oz) (04/08 0500)    Intake/Output from previous day: 04/07 0701 - 04/08 0700 In: 1093 [P.O.:650; I.V.:443] Out: 1500 [Urine:1500] Intake/Output this shift:    Medications Current Facility-Administered Medications  Medication Dose Route Frequency Provider Last Rate Last Dose  . 0.9 %  sodium chloride infusion   Intravenous Continuous Olivia Mackie, MD 10 mL/hr at 10/25/12 2200    . 0.9 %  sodium chloride infusion  250 mL Intravenous PRN Therisa Doyne, MD      . acetaminophen (TYLENOL) tablet 650 mg  650 mg Oral Q4H PRN Therisa Doyne, MD   650 mg at 10/26/12 2003  . ALPRAZolam Prudy Feeler) tablet 0.25 mg  0.25 mg Oral TID PRN Abelino Derrick, PA-C   0.25 mg at 10/26/12 2003  . amLODipine (NORVASC) tablet 5 mg  5 mg Oral Daily Eda Paschal [place], PA-C   5 mg at 10/26/12 0934  . aspirin EC tablet 81 mg  81 mg Oral Daily Therisa Doyne, MD   81 mg at 10/26/12 0934  . carvedilol (COREG) tablet 6.25 mg  6.25 mg Oral BID WC Eda Paschal [place], PA-C   6.25 mg at 10/26/12 1612  . clopidogrel (PLAVIX) tablet 75 mg  75 mg Oral Q breakfast Therisa Doyne, MD   75 mg at 10/26/12 0851  . furosemide (LASIX) injection 40 mg  40 mg Intravenous BID Abelino Derrick, PA-C   40 mg at 10/26/12 1707  . guaiFENesin (MUCINEX) 12 hr tablet 600 mg  600 mg Oral BID Therisa Doyne, MD   600 mg at 10/26/12 2119  . heparin ADULT infusion 100 units/mL (25000 units/250 mL)  850 Units/hr Intravenous Continuous Drue Fritschle, RPH 8.5 mL/hr at 10/26/12 0550 850 Units/hr at 10/26/12 0550  . hydrALAZINE  (APRESOLINE) injection 10 mg  10 mg Intravenous Q4H PRN Abelino Derrick, PA-C   10 mg at 10/25/12 1159  . isosorbide mononitrate (IMDUR) 24 hr tablet 30 mg  30 mg Oral Daily Abelino Derrick, PA-C   30 mg at 10/26/12 1612  . levalbuterol (XOPENEX) nebulizer solution 0.63 mg  0.63 mg Nebulization Q6H PRN Abelino Derrick, PA-C   0.63 mg at 10/25/12 1247  . ondansetron (ZOFRAN) injection 4 mg  4 mg Intravenous Q6H PRN Therisa Doyne, MD      . ramipril (ALTACE) capsule 10 mg  10 mg Oral Daily Therisa Doyne, MD   10 mg at 10/26/12 0934  . sodium chloride 0.9 % injection 3 mL  3 mL Intravenous Q12H Therisa Doyne, MD   3 mL at 10/26/12 2119  . sodium chloride 0.9 % injection 3 mL  3 mL Intravenous PRN Therisa Doyne, MD      . traMADol (ULTRAM) tablet 50 mg  50 mg Oral Q6H PRN Abelino Derrick, PA-C   50 mg at 10/26/12 1620  . zolpidem (AMBIEN) tablet 5 mg  5 mg Oral QHS PRN Abelino Derrick, PA-C        PE: General appearance: alert, cooperative and no distress Lungs: clear to auscultation  bilaterally Heart: regular rate and rhythm, S1, S2 normal, no murmur, click, rub or gallop Abdomen: +BS, nontender.  He does have an ~ 6-8cm pulsatile abd aorta Extremities: No LEE Pulses: 2+ and symmetric Neurologic: Grossly normal  Lab Results:   Recent Labs  10/25/12 0111 10/25/12 0123 10/26/12 0512 10/27/12 0500  WBC 6.8  --  5.6 5.3  HGB 12.5* 13.3 12.0* 12.2*  HCT 37.1* 39.0 34.9* 35.9*  PLT 207  --  197 201   BMET  Recent Labs  10/25/12 0111 10/25/12 0123 10/26/12 0512 10/27/12 0500  NA 140 144 139 138  K 4.4 4.4 3.9 4.0  CL 108 111 105 100  CO2 21  --  26 29  GLUCOSE 157* 159* 91 84  BUN 26* 28* 29* 37*  CREATININE 1.17 1.10 1.31 1.58*  CALCIUM 9.1  --  8.9 9.2   PT/INR  Recent Labs  10/25/12 0111  LABPROT 13.0  INR 0.99   Study Conclusions  - Left ventricle: The cavity size was mildly dilated. Systolic function was severely reduced. The estimated ejection  fraction was in the range of 20% to 25%. Diffuse hypokinesis. E-A fusion due to first degree AV block does notallow evaluation of LV diastolic function. Systolic dominant pulmonary vein flow suggests normal mean left atrial pressure.. - Ventricular septum: Septal motion showed paradox. These changes are consistent with a left bundle branch block. - Mitral valve: Mild regurgitation. - Left atrium: The atrium was mildly dilated. - Right ventricle: The cavity size was mildly dilated. Wall thickness was normal. - Atrial septum: No defect or patent foramen ovale was identified. - Pericardium, extracardiac: A trivial pericardial effusion was identified posterior to the heart. There was a right pleural effusion.   Assessment/Plan  Principal Problem:   Acute respiratory failure- Bi pap on admission Active Problems:   HYPERLIPIDEMIA   LOSS OF APPETITE   Unstable angina   CAD, STEMI July 2011 Rx'd with LAD BMS with moderate residual RCA/CFX disease. Cath 8/13- patent LAD   Ischemic cardiomyopathy, EF 25-30% with STEMI, improved to 35-40% 2D Sept 2011   LBBB (left bundle branch block)   COPD by history and CXR   History of CVA 2009   Hypertensive urgency   Elevated LFTs secondary to Rt Ht failure   Acute combined systolic and diastolic congestive heart failure   PVD, AAA 4.2 X 4.1cm by [place] 10/25/12  Plan:  The last four troponin were negative.  Chest pain yesterday.  BP stable now.  Was hypotensive then NTG drip DCd.  Net fluids -0.3L in 24hrs with BID IV lasix.  Previous echo from 2011 showed an EF of 25-35%.  Current Echo EF 20-25%.  Still on heparin.  Worsening renal function. Will hold lasix today.  Recheck BNP.  Lexiscan today.     LOS: 2 days    HAGER, BRYAN 10/27/2012 7:38 AM  I have seen and examined the patient along with Wilburt Finlay, PA.  I have reviewed the chart, notes and new data.  I agree with PA's note.  Key new complaints: No angina or dyspnea  Key examination  changes: no edema or JVD, still has S3 (and split S2 of LBBB); Weight down 11 lb from admission, but negative 1L by I/O (error in weight today?); BP well controlled Key new findings / data: pro BNP still well above baseline, but BUN and creat are inching up.  PLAN: Cause of decompensation still not entirely clear, uncertain whether BP elevation was trigger of  HF or consequence of volume overload.  Still exploring whether or not he has ever been evaluated for renal artery stenosis.  Reduce diuretic dose.  Lexiscan Myoview today. Renal Duplex if never done.  Unless revascularization becomes necessary, he may be a good candidate for CRT as he has severely depressed LVEF and a very broad LBBB (>150 ms).  Thurmon Fair, MD, [facility] [phone] 10/27/2012, 8:29 AM

## 2012-10-28 DIAGNOSIS — I5023 Acute on chronic systolic (congestive) heart failure: Secondary | ICD-10-CM

## 2012-10-28 LAB — HEPARIN LEVEL (UNFRACTIONATED): Heparin Unfractionated: 0.44 IU/mL (ref 0.30–0.70)

## 2012-10-28 LAB — CBC
HCT: 33.5 % — ABNORMAL LOW (ref 39.0–52.0)
MCV: 83.5 fL (ref 78.0–100.0)
Platelets: 187 10*3/uL (ref 150–400)
RBC: 4.01 MIL/uL — ABNORMAL LOW (ref 4.22–5.81)
WBC: 5.3 10*3/uL (ref 4.0–10.5)

## 2012-10-28 LAB — BASIC METABOLIC PANEL
BUN: 41 mg/dL — ABNORMAL HIGH (ref 6–23)
CO2: 28 mEq/L (ref 19–32)
Chloride: 102 mEq/L (ref 96–112)
Creatinine, Ser: 1.12 mg/dL (ref 0.50–1.35)
Potassium: 3.7 mEq/L (ref 3.5–5.1)

## 2012-10-28 LAB — TROPONIN I: Troponin I: <0.3 ng/mL (ref <0.30)

## 2012-10-28 MED ORDER — ISOSORBIDE DINITRATE 10 MG PO TABS
10.0000 mg | ORAL_TABLET | Freq: Three times a day (TID) | ORAL | Status: DC
  Administered 2012-10-28 – 2012-11-01 (×13): 10 mg via ORAL
  Filled 2012-10-28 (×15): qty 1

## 2012-10-28 MED ORDER — FUROSEMIDE 10 MG/ML IJ SOLN
40.0000 mg | Freq: Two times a day (BID) | INTRAMUSCULAR | Status: DC
  Administered 2012-10-28 – 2012-10-29 (×3): 40 mg via INTRAVENOUS
  Filled 2012-10-28 (×5): qty 4

## 2012-10-28 MED ORDER — HYDRALAZINE HCL 10 MG PO TABS
10.0000 mg | ORAL_TABLET | Freq: Three times a day (TID) | ORAL | Status: DC
  Administered 2012-10-28 – 2012-11-01 (×12): 10 mg via ORAL
  Filled 2012-10-28 (×15): qty 1

## 2012-10-28 NOTE — Progress Notes (Signed)
Brief Nutrition Note:   RD pulled to pt for health history.  Spoke with family at bedside. They state that the pt has a normal appetite. Report 110 lbs to be his usual weight (per [facility]). Pt has been eating well per meal completion, family has also been bringing in food from home.   Chart reviewed. No nutrition interventions warranted at this time. Please consult as needed.   Clarene Duke RD, LDN Pager [phone] After Hours pager [phone]

## 2012-10-28 NOTE — Progress Notes (Signed)
ANTICOAGULATION CONSULT NOTE - Follow Up Consult  Pharmacy Consult for Heparin Indication: chest pain/ACS  No Known Allergies  Patient Measurements: Height: 5\' 9"  (175.3 cm) Weight: 114 lb 3.2 oz (51.8 kg) IBW/kg (Calculated) : 70.7 Heparin Dosing Weight:    Vital Signs: Temp: 98.7 F (37.1 C) (04/09 0400) Temp src: Oral (04/09 0400) BP: 124/86 mmHg (04/09 0917) Pulse Rate: 88 (04/09 0913)  Labs:  Recent Labs  10/26/12 0512  10/27/12 0500  10/27/12 1731 10/27/12 2231 10/28/12 0452  HGB 12.0*  --  12.2*  --   --   --  11.5*  HCT 34.9*  --  35.9*  --   --   --  33.5*  PLT 197  --  201  --   --   --  187  HEPARINUNFRC 0.52  --  0.58  --   --   --  0.44  CREATININE 1.31  --  1.58*  --   --   --  1.12  TROPONINI 0.31*  < >  --   < > <0.30 <0.30 <0.30  < > = values in this interval not displayed.  Estimated Creatinine Clearance: 43 ml/min (by C-G formula based on Cr of 1.12).  . sodium chloride 10 mL/hr at 10/27/12 2157  . heparin 850 Units/hr (10/26/12 0550)    72 yo male on IV heparin for chest pain.  Heparin level therapeutic this morning on 850 units/hr.  No bleeding or complications noted.  Minor anemia, but otherwise CBC stable.    Goal of Therapy:  Heparin level 0.3-0.7 units/ml Monitor platelets by anticoagulation protocol: Yes   Plan:  1. Continue IV heparin at current rate of 850 units/hr. 2. Continue daily heparin level and CBC. 3. F/U plans for further workup and duration of IV heparin.  Tad Moore, BCPS  Clinical Pharmacist Pager [phone]  10/28/2012 9:45 AM

## 2012-10-28 NOTE — Consult Note (Signed)
 ELECTROPHYSIOLOGY CONSULT NOTE  Patient ID: [name] MRN: [id number], DOB/AGE: 02/29/1941   Admit date: 10/25/2012 Date of Consult: 10/28/2012  Primary Cardiologist: Jonathan Berry, MD Reason for Consultation: Possible CRT therapy  History of Present Illness [name] is a 72 year old man with an ischemic CM, EF 20-25% initially diagnosed in Sept 2011, CAD s/p anterior wall MI, LBBB, COPD, HTN and prior CVA who has been admitted with acute on chronic systolic HF. He speaks [place] and consents to his daughter translating for him. He reports increasing SOB and chest pain x 3 weeks. He has also had increasing orthopnea and PND. He denies LE swelling. He denies palpitations, dizziness, near syncope or syncope. His SOB has improved with IV diuresis. His activity is limited by SOB with minimal exertion. This also has worsened over the last 3 weeks. [name] reports he seemed to be getting along fine until 3 weeks ago and was actually able to walk around the track at a local school. He reports walking "slowly" approximately 1 mile daily when the weather is nice. He does not climb stairs.    Past Medical History Past Medical History  Diagnosis Date  . Hypertension   . Ischemic cardiomyopathy Sept 2011    EF improved to 35-40% 2D  . COPD (chronic obstructive pulmonary disease)   . CVA (cerebral infarction) 2009  . LBBB (left bundle branch block)   . Stroke   . History of acute anterior wall MI 2011    s/p PCI to LAD; 2.25 x 12 mm BMS    Past Surgical History Past Surgical History  Procedure Laterality Date  . Coronary stent placement  February 12 2010    LAD BMS     Allergies/Intolerances No Known Allergies  Inpatient Medications . aspirin EC  81 mg Oral Daily  . carvedilol  6.25 mg Oral BID WC  . clopidogrel  75 mg Oral Q breakfast  . furosemide  40 mg Intravenous Daily  . guaiFENesin  600 mg Oral BID  . hydrALAZINE  10 mg Oral Q8H  . isosorbide dinitrate  10 mg Oral Q8H  .  ramipril  10 mg Oral Daily  . sodium chloride  3 mL Intravenous Q12H   . sodium chloride 10 mL/hr at 10/27/12 2157  . heparin 850 Units/hr (10/26/12 0550)    Family History Positive for CAD   Social History Social History  . Marital Status: Married   Social History Main Topics  . Smoking status: Former Smoker    Quit date: 03/13/2009  . Smokeless tobacco: No  . Alcohol Use: No  . Drug Use: No   Review of Systems General: No chills, fever, night sweats or weight changes  Cardiovascular:  No chest pain, dyspnea on exertion, edema, orthopnea, palpitations, paroxysmal nocturnal dyspnea Dermatological: No rash, lesions or masses Respiratory: No cough, dyspnea Urologic: No hematuria, dysuria Abdominal: No nausea, vomiting, diarrhea, bright red blood per rectum, melena, or hematemesis Neurologic: No visual changes, weakness, changes in mental status All other systems reviewed and are otherwise negative except as noted above.  Physical Exam Blood pressure 124/86, pulse 88, temperature 97.6 F (36.4 C), temperature source Oral, resp. rate 14, height 5' 9" (1.753 m), weight 114 lb 3.2 oz (51.8 kg), SpO2 96.00%.  General: Well developed, thin 72 year old male in no acute distress. HEENT: Normocephalic, atraumatic. EOMs intact. Sclera nonicteric. Oropharynx clear.  Neck: Supple. +JVD. Lungs: Respirations regular and unlabored. Diminished breath sounds but CTA bilaterally. No   wheezes, rales or rhonchi. Heart: RRR. S1, S2 present. No murmurs, rub, S3 or S4. Abdomen: Soft, non-distended. BS present x 4 quadrants.  Extremities: No clubbing, cyanosis or edema. DP/PT/Radials 2+ and equal bilaterally. Psych: Normal affect. Neuro: Alert and oriented X 3. Moves all extremities spontaneously. Musculoskeletal: No kyphosis. Skin: Intact. Warm and dry. No rashes or petechiae in exposed areas.   Labs  ProBNP 10283  Recent Labs  10/27/12 1414 10/27/12 1731 10/27/12 2231 10/28/12 0452    TROPONINI <0.30 <0.30 <0.30 <0.30   Lab Results  Component Value Date   WBC 5.3 10/28/2012   HGB 11.5* 10/28/2012   HCT 33.5* 10/28/2012   MCV 83.5 10/28/2012   PLT 187 10/28/2012    Recent Labs Lab 10/25/12 0111  10/28/12 0452  NA 140  < > 138  K 4.4  < > 3.7  CL 108  < > 102  CO2 21  < > 28  BUN 26*  < > 41*  CREATININE 1.17  < > 1.12  CALCIUM 9.1  < > 8.8  PROT 7.8  --   --   BILITOT 0.3  --   --   ALKPHOS 95  --   --   ALT 149*  --   --   AST 217*  --   --   GLUCOSE 157*  < > 74  < > = values in this interval not displayed.  Recent Labs  10/26/12 0512  TSH 1.764    Radiology/Studies Nm Myocar Multi W/spect W/wall Motion / EF 10/27/2012   Findings:  There has been a marked change since the exam in 2008. The patient now has a large fixed defect involving the inferior wall and there is also a fixed defect along the anterior apical wall.  There is no evidence to suggest reversibility or ischemia.  GATED LEFT VENTRICULAR WALL MOTION STUDY  Findings:  Review of the gated images demonstrates severe hypokinesia with paradoxical motion along the anterior-septal wall.  LEFT VENTRICULAR EJECTION FRACTION  Findings:  QGS ejection fraction measures 19% , with an end- diastolic volume of 165 ml and an end-systolic volume of 133 ml.  IMPRESSION: No evidence for pharmacologically induced ischemia.  Large fixed defect involving the inferior wall and a fixed defect involving the anterior-apical wall.  Findings are compatible with areas of infarct.  These are new from the previous examination.  Abnormal wall motion with severe hypokinesia.  Evidence for paradoxical wall motion along the anterior-septal wall.     Dg Chest Port 1 View 10/25/2012  *RADIOLOGY REPORT*  Clinical Data: Shortness of breath  PORTABLE CHEST - 1 VIEW  Comparison: 03/13/2012  Findings: Emphysematous changes and cardiac enlargement similar previous study.  There is interval development of pulmonary venous congestion with  interstitial changes suggesting interstitial edema. No focal consolidation.  No blunting of costophrenic angles.  No pneumothorax.  Calcification of the aorta.  IMPRESSION: Cardiac enlargement with developing pulmonary vascular congestion and interstitial edema since previous study.  Emphysematous changes.   Original Report Authenticated By: William Stevens, M.D.     Echocardiogram  Study Conclusions - Left ventricle: The cavity size was mildly dilated. Systolic function was severely reduced. The estimated ejection fraction was in the range of 20% to 25%. Diffuse hypokinesis. E-A fusion due to first degree AV block does notallow evaluation of LV diastolic function. Systolic dominant pulmonary vein flow suggests normal mean left atrial pressure.. - Ventricular septum: Septal motion showed paradox. These changes are consistent with a left   bundle branch block. - Mitral valve: Mild regurgitation. - Left atrium: The atrium was mildly dilated. - Right ventricle: The cavity size was mildly dilated. Wall thickness was normal. - Atrial septum: No defect or patent foramen ovale was identified. - Pericardium, extracardiac: A trivial pericardial effusion was identified posterior to the heart. There was a right pleural effusion.   12-lead ECG on admission shows sinus tachycardia with LBBB, QRS 148 msec  Telemetry shows SR with LBBB; occasional PVCs   Assessment and Plan 1. Acute on chronic systolic HF 2. Ischemic CM, EF 20-25% 3. CAD s/p prior MI 4. LBBB, QRS ~150 msec 5. COPD 6. PVD Mr. Bias meets criteria for BiV device/CRT given his longstanding ischemic CM, chronic systolic HF and LBBB. Discussed BiV PPM versus BiV ICD with Mr. Lindquist and his daughter. Dr. Taylor to see and discuss further.   Signed, EDMISTEN, BROOKE, PA-C 10/28/2012, 10:21 AM  EP Attending  Patient seen and examined. Agree with above history, exam, assessment and plan. He has a long standing ICM, chronic systolic CHF,  EF 25%, and LBBB. I would treat his CHF aggressively, and allow patient to be discharged home and return in 2 weeks for device implant. I will try to arrange if he is willing.  Gregg Taylor,M.D. 

## 2012-10-28 NOTE — Progress Notes (Addendum)
Pt. Seen and examined. Agree with the NP/PA-C note as written.  Continues to diurese, says (per relative) he is breathing better. Exam indicates RRR, s1/s2, 2/6 SEM at apex, abdomen scaphoid, pulsatile liver, JVP to angle of teh jaw, bilateral LL crackles, no edema, follows commands. EKG shows LBBB with QRS of 148-150 msec. EF 20% with a more dilated ventricle. There are fixed anterior and inferior defects on NST, but no ischemia. I suspect the anterior defect is scar, however, inferiorly may be attenuation artifact from dilated ventricle. There is global hypokinesis by echo and SPECT imaging.  Renal dopplers show no significant stenosis - bp remains elevated. Cath in 02/2012 showed patent LAD stent and distal RCA and OM disease not amenable to PCI.   Role of cardiac cath in this situation is not clearly of benefit, as I suspect that he has a combination of non-ischemic and ischemic CM with significant intraventricular dyssynchrony. Will add standing hydralazine today to nitrates, prefer not to use amlodipine in setting of decompensated HF and work to increase afterload reducing agents for BP control. Consult EP to evaluate for CRT-D therapy. Increase diuresis to lasix 40 mg BID, he is now out -1.4L but was net positive yesterday.  Ruled-out for MI, no ischemia on NST, will discontinue heparin gtts.  Chrystie Nose, MD, [facility] Attending Cardiologist [facility]

## 2012-10-28 NOTE — Progress Notes (Signed)
Subjective:   Objective: Vital signs in last 24 hours: Temp:  [97.8 F (36.6 C)-99.3 F (37.4 C)] 98.7 F (37.1 C) (04/09 0400) Pulse Rate:  [81-97] 97 (04/09 0700) Resp:  [14-23] 21 (04/09 0700) BP: (97-149)/(56-92) 145/72 mmHg (04/09 0600) SpO2:  [96 %-100 %] 97 % (04/09 0700) Weight:  [51.8 kg (114 lb 3.2 oz)] 51.8 kg (114 lb 3.2 oz) (04/09 0450) Weight change: 1.7 kg (3 lb 12 oz)   Intake/Output from previous day: +189 04/08 0701 - 04/09 0700 In: 794 [P.O.:240; I.V.:554] Out: 595 [Urine:595] Intake/Output this shift:    PE: exam per Dr. Rennis Golden   Lab Results:  Recent Labs  10/27/12 0500 10/28/12 0452  WBC 5.3 5.3  HGB 12.2* 11.5*  HCT 35.9* 33.5*  PLT 201 187   BMET  Recent Labs  10/27/12 0500 10/28/12 0452  NA 138 138  K 4.0 3.7  CL 100 102  CO2 29 28  GLUCOSE 84 74  BUN 37* 41*  CREATININE 1.58* 1.12  CALCIUM 9.2 8.8    Recent Labs  10/27/12 2231 10/28/12 0452  TROPONINI <0.30 <0.30    Lab Results  Component Value Date   CHOL 210* 03/14/2012   HDL 52 03/14/2012   LDLCALC 135* 03/14/2012   TRIG 113 03/14/2012   CHOLHDL 4.0 03/14/2012   Lab Results  Component Value Date   HGBA1C  Value: 5.6 (NOTE)                                                                       According to the ADA Clinical Practice Recommendations for 2011, when HbA1c is used as a screening test:   >=6.5%   Diagnostic of Diabetes Mellitus           (if abnormal result  is confirmed)  5.7-6.4%   Increased risk of developing Diabetes Mellitus  References:Diagnosis and Classification of Diabetes Mellitus,Diabetes Care,2011,34(Suppl 1):S62-S69 and Standards of Medical Care in         Diabetes - 2011,Diabetes Care,2011,34  (Suppl 1):S11-S61. 02/12/2010     Lab Results  Component Value Date   TSH 1.764 10/26/2012      Studies/Results: Nm Myocar Multi W/spect W/wall Motion / Ef  10/27/2012  *RADIOLOGY REPORT*  Clinical Data:  Chest pain.  Technique:  Standard myocardial SPECT  imaging performed after resting intravenous injection of 10 mCi Tc-78m sestimibi. Subsequently, intravenous infusion of regadenoson performed under the supervision of the Cardiology staff.  At peak effect of the drug, 30 mCi Tc-34m sestimibi injected intravenously and standard myocardial SPECT imaging performed.  Quantitative gated imaging also performed to evaluate left ventricular wall motion and estimate left ventricular ejection fraction.  Comparison:  10/30/2006  MYOCARDIAL IMAGING WITH SPECT (REST AND PHARMACOLOGIC-STRESS)  Findings:  There has been a marked change since the exam in 2008. The patient now has a large fixed defect involving the inferior wall and there is also a fixed defect along the anterior apical wall.  There is no evidence to suggest reversibility or ischemia.  GATED LEFT VENTRICULAR WALL MOTION STUDY  Findings:  Review of the gated images demonstrates severe hypokinesia with paradoxical motion along the anterior-septal wall.  LEFT VENTRICULAR EJECTION FRACTION  Findings:  QGS ejection fraction measures 19% ,  with an end- diastolic volume of 165 ml and an end-systolic volume of 133 ml.  IMPRESSION: No evidence for pharmacologically induced ischemia.  Large fixed defect involving the inferior wall and a fixed defect involving the anterior-apical wall.  Findings are compatible with areas of infarct.  These are new from the previous examination.  Abnormal wall motion with severe hypokinesia.  Evidence for paradoxical wall motion along the anterior-septal wall.   Original Report Authenticated By: Richarda Overlie, M.D.    2D Echo: Left ventricle: The cavity size was mildly dilated. Systolic function was severely reduced. The estimated ejection fraction was in the range of 20% to 25%. Diffuse hypokinesis. E-A fusion due to first degree AV block does notallow evaluation of LV diastolic function. Systolic dominant pulmonary vein flow suggests normal mean left atrial pressure.. - Ventricular  septum: Septal motion showed paradox. These changes are consistent with a left bundle branch block. - Mitral valve: Mild regurgitation. - Left atrium: The atrium was mildly dilated. - Right ventricle: The cavity size was mildly dilated. Wall thickness was normal. - Atrial septum: No defect or patent foramen ovale was identified. - Pericardium, extracardiac: A trivial pericardial effusion was identified posterior to the heart. There was a right pleural effusion  Medications: I have reviewed the patient's current medications. Marland Kitchen amLODipine  5 mg Oral Daily  . aspirin EC  81 mg Oral Daily  . carvedilol  6.25 mg Oral BID WC  . clopidogrel  75 mg Oral Q breakfast  . furosemide  40 mg Intravenous Daily  . guaiFENesin  600 mg Oral BID  . isosorbide mononitrate  30 mg Oral Daily  . ramipril  10 mg Oral Daily  . sodium chloride  3 mL Intravenous Q12H   Assessment/Plan: Principal Problem:   Acute respiratory failure- Bi pap on admission Active Problems:   HYPERLIPIDEMIA   LOSS OF APPETITE   Unstable angina   CAD, STEMI July 2011 Rx'd with LAD BMS with moderate residual RCA/CFX disease. Cath 8/13- patent LAD   Ischemic cardiomyopathy, EF 25-30% with STEMI, improved to 35-40% 2D Sept 2011   LBBB (left bundle branch block)   COPD by history and CXR   History of CVA 2009   Hypertensive urgency   Elevated LFTs secondary to Rt Ht failure   Acute combined systolic and diastolic congestive heart failure   PVD, AAA 4.2 X 4.1cm by [place] 10/25/12  PLAN: see Nuc study.  Negative MI, EF in 02/2012 with cath 40%.  Admitted with resp failure, pro BNP 10283.  Wt up today from 110 lb to 114 lb.       LOS: 3 days   Time spent with pt. :20 minutes. [name] R 10/28/2012, 8:57 AM

## 2012-10-29 LAB — BASIC METABOLIC PANEL
BUN: 42 mg/dL — ABNORMAL HIGH (ref 6–23)
CO2: 29 mEq/L (ref 19–32)
GFR calc non Af Amer: 58 mL/min — ABNORMAL LOW (ref >90)
Glucose, Bld: 82 mg/dL (ref 70–99)
Potassium: 3.8 mEq/L (ref 3.5–5.1)

## 2012-10-29 LAB — CBC
HCT: 34.9 % — ABNORMAL LOW (ref 39.0–52.0)
Hemoglobin: 12.1 g/dL — ABNORMAL LOW (ref 13.0–17.0)
MCHC: 34.7 g/dL (ref 30.0–36.0)
RBC: 4.21 MIL/uL — ABNORMAL LOW (ref 4.22–5.81)

## 2012-10-29 NOTE — Progress Notes (Signed)
   ELECTROPHYSIOLOGY ROUNDING NOTE    Patient Name: [name] Date of Encounter: 10-29-2012  Dr Ladona Ridgel discussed CRTD implantation again today with the patient and his daughter.  Plan to implant on 11-12-2012 at 7:30AM.  Patient should arrive to short stay at 5:30AM on 11-12-2012.  He should be NPO after midnight 11-11-2012.     Signed, Marena Chancy, BSN

## 2012-10-29 NOTE — Progress Notes (Signed)
[facility]  DAILY PROGRESS NOTE   Subjective:  No events noted overnight. Creatinine has normalized with diuresis - he put out 2.5L net negative yesterday with increased IV lasix and afterload reduction. Norvasc discontinued - on coreg, isordil, hydralazine and ramipril. Appreciate EPS evaluation for CRT-D.   Objective:  Temp:  [97.4 F (36.3 C)-99.3 F (37.4 C)] 98.5 F (36.9 C) (04/10 0741) Pulse Rate:  [69-92] 70 (04/10 0800) Resp:  [13-25] 21 (04/10 0800) BP: (96-142)/(44-86) 121/71 mmHg (04/10 0800) SpO2:  [95 %-100 %] 98 % (04/10 0800) Weight:  [50.7 kg (111 lb 12.4 oz)] 50.7 kg (111 lb 12.4 oz) (04/10 0616) Weight change: -1.1 kg (-2 lb 6.8 oz)  Intake/Output from previous day: 04/09 0701 - 04/10 0700 In: 661 [P.O.:494; I.V.:167] Out: 2450 [Urine:2450]  Intake/Output from this shift:    Medications: Current Facility-Administered Medications  Medication Dose Route Frequency Provider Last Rate Last Dose  . 0.9 %  sodium chloride infusion   Intravenous Continuous Olivia Mackie, MD      . 0.9 %  sodium chloride infusion  250 mL Intravenous PRN Therisa Doyne, MD      . acetaminophen (TYLENOL) tablet 650 mg  650 mg Oral Q4H PRN Therisa Doyne, MD   650 mg at 10/28/12 0432  . ALPRAZolam Prudy Feeler) tablet 0.25 mg  0.25 mg Oral TID PRN Abelino Derrick, PA-C   0.25 mg at 10/26/12 2003  . aspirin EC tablet 81 mg  81 mg Oral Daily Therisa Doyne, MD   81 mg at 10/28/12 0917  . carvedilol (COREG) tablet 6.25 mg  6.25 mg Oral BID WC Eda Paschal Kilroy, PA-C   6.25 mg at 10/28/12 1717  . clopidogrel (PLAVIX) tablet 75 mg  75 mg Oral Q breakfast Therisa Doyne, MD   75 mg at 10/28/12 0913  . furosemide (LASIX) injection 40 mg  40 mg Intravenous BID Chrystie Nose, MD   40 mg at 10/28/12 1717  . guaiFENesin (MUCINEX) 12 hr tablet 600 mg  600 mg Oral BID Therisa Doyne, MD   600 mg at 10/28/12 2139  . hydrALAZINE (APRESOLINE) injection 10 mg  10  mg Intravenous Q4H PRN Abelino Derrick, PA-C   10 mg at 10/25/12 1159  . hydrALAZINE (APRESOLINE) tablet 10 mg  10 mg Oral Q8H Nada Boozer, NP   10 mg at 10/29/12 0521  . isosorbide dinitrate (ISORDIL) tablet 10 mg  10 mg Oral Q8H Nada Boozer, NP   10 mg at 10/29/12 0208  . levalbuterol (XOPENEX) nebulizer solution 0.63 mg  0.63 mg Nebulization Q6H PRN Abelino Derrick, PA-C   0.63 mg at 10/25/12 1247  . ondansetron (ZOFRAN) injection 4 mg  4 mg Intravenous Q6H PRN Therisa Doyne, MD      . ramipril (ALTACE) capsule 10 mg  10 mg Oral Daily Therisa Doyne, MD   10 mg at 10/28/12 0917  . sodium chloride 0.9 % injection 3 mL  3 mL Intravenous Q12H Therisa Doyne, MD   3 mL at 10/28/12 2141  . sodium chloride 0.9 % injection 3 mL  3 mL Intravenous PRN Therisa Doyne, MD      . traMADol (ULTRAM) tablet 50 mg  50 mg Oral Q6H PRN Abelino Derrick, PA-C   50 mg at 10/26/12 1620  . zolpidem (AMBIEN) tablet 5 mg  5 mg Oral QHS PRN Abelino Derrick, PA-C        Physical Exam: General appearance: alert  and no distress Neck: JVD - 3 cm above sternal notch, no adenopathy, no carotid bruit, supple, symmetrical, trachea midline and thyroid not enlarged, symmetric, no tenderness/mass/nodules Lungs: clear to auscultation bilaterally Heart: regular rate and rhythm, no S3 or S4 and systolic murmur: holosystolic 2/6, crescendo at apex Abdomen: soft, non-tender; bowel sounds normal; no masses,  no organomegaly Extremities: extremities normal, atraumatic, no cyanosis or edema Pulses: 2+ and symmetric Skin: Skin color, texture, turgor normal. No rashes or lesions Neurologic: Grossly normal  Lab Results: Results for orders placed during the hospital encounter of 10/25/12 (from the past 48 hour(s))  TROPONIN I     Status: None   Collection Time    10/27/12  2:14 PM      Result Value Range   Troponin I <0.30  <0.30 ng/mL   Comment:            Due to the release kinetics of cTnI,     a negative result  within the first hours     of the onset of symptoms does not rule out     myocardial infarction with certainty.     If myocardial infarction is still suspected,     repeat the test at appropriate intervals.  TROPONIN I     Status: None   Collection Time    10/27/12  5:31 PM      Result Value Range   Troponin I <0.30  <0.30 ng/mL   Comment:            Due to the release kinetics of cTnI,     a negative result within the first hours     of the onset of symptoms does not rule out     myocardial infarction with certainty.     If myocardial infarction is still suspected,     repeat the test at appropriate intervals.  TROPONIN I     Status: None   Collection Time    10/27/12 10:31 PM      Result Value Range   Troponin I <0.30  <0.30 ng/mL   Comment:            Due to the release kinetics of cTnI,     a negative result within the first hours     of the onset of symptoms does not rule out     myocardial infarction with certainty.     If myocardial infarction is still suspected,     repeat the test at appropriate intervals.  HEPARIN LEVEL (UNFRACTIONATED)     Status: None   Collection Time    10/28/12  4:52 AM      Result Value Range   Heparin Unfractionated 0.44  0.30 - 0.70 IU/mL   Comment:            IF HEPARIN RESULTS ARE BELOW     EXPECTED VALUES, AND PATIENT     DOSAGE HAS BEEN CONFIRMED,     SUGGEST FOLLOW UP TESTING     OF ANTITHROMBIN III LEVELS.  CBC     Status: Abnormal   Collection Time    10/28/12  4:52 AM      Result Value Range   WBC 5.3  4.0 - 10.5 K/uL   RBC 4.01 (*) 4.22 - 5.81 MIL/uL   Hemoglobin 11.5 (*) 13.0 - 17.0 g/dL   HCT 09.8 (*) 11.9 - 14.7 %   MCV 83.5  78.0 - 100.0 fL   MCH 28.7  26.0 - 34.0  pg   MCHC 34.3  30.0 - 36.0 g/dL   RDW 16.1  09.6 - 04.5 %   Platelets 187  150 - 400 K/uL  BASIC METABOLIC PANEL     Status: Abnormal   Collection Time    10/28/12  4:52 AM      Result Value Range   Sodium 138  135 - 145 mEq/L   Potassium 3.7  3.5 -  5.1 mEq/L   Chloride 102  96 - 112 mEq/L   CO2 28  19 - 32 mEq/L   Glucose, Bld 74  70 - 99 mg/dL   BUN 41 (*) 6 - 23 mg/dL   Creatinine, Ser 4.09  0.50 - 1.35 mg/dL   Calcium 8.8  8.4 - 81.1 mg/dL   GFR calc non Af Amer 63 (*) >90 mL/min   GFR calc Af Amer 73 (*) >90 mL/min   Comment:            The eGFR has been calculated     using the CKD EPI equation.     This calculation has not been     validated in all clinical     situations.     eGFR's persistently     <90 mL/min signify     possible Chronic Kidney Disease.  TROPONIN I     Status: None   Collection Time    10/28/12  4:52 AM      Result Value Range   Troponin I <0.30  <0.30 ng/mL   Comment:            Due to the release kinetics of cTnI,     a negative result within the first hours     of the onset of symptoms does not rule out     myocardial infarction with certainty.     If myocardial infarction is still suspected,     repeat the test at appropriate intervals.  CBC     Status: Abnormal   Collection Time    10/29/12  4:53 AM      Result Value Range   WBC 6.5  4.0 - 10.5 K/uL   RBC 4.21 (*) 4.22 - 5.81 MIL/uL   Hemoglobin 12.1 (*) 13.0 - 17.0 g/dL   HCT 91.4 (*) 78.2 - 95.6 %   MCV 82.9  78.0 - 100.0 fL   MCH 28.7  26.0 - 34.0 pg   MCHC 34.7  30.0 - 36.0 g/dL   RDW 21.3  08.6 - 57.8 %   Platelets 194  150 - 400 K/uL  BASIC METABOLIC PANEL     Status: Abnormal   Collection Time    10/29/12  4:53 AM      Result Value Range   Sodium 139  135 - 145 mEq/L   Potassium 3.8  3.5 - 5.1 mEq/L   Chloride 101  96 - 112 mEq/L   CO2 29  19 - 32 mEq/L   Glucose, Bld 82  70 - 99 mg/dL   BUN 42 (*) 6 - 23 mg/dL   Creatinine, Ser 4.69  0.50 - 1.35 mg/dL   Calcium 9.1  8.4 - 62.9 mg/dL   GFR calc non Af Amer 58 (*) >90 mL/min   GFR calc Af Amer 67 (*) >90 mL/min   Comment:            The eGFR has been calculated     using the CKD EPI equation.     This calculation has  not been     validated in all clinical      situations.     eGFR's persistently     <90 mL/min signify     possible Chronic Kidney Disease.  MAGNESIUM     Status: None   Collection Time    10/29/12  4:53 AM      Result Value Range   Magnesium 2.1  1.5 - 2.5 mg/dL    Imaging: Nm Myocar Multi W/spect W/wall Motion / Ef  10/27/2012  *RADIOLOGY REPORT*  Clinical Data:  Chest pain.  Technique:  Standard myocardial SPECT imaging performed after resting intravenous injection of 10 mCi Tc-30m sestimibi. Subsequently, intravenous infusion of regadenoson performed under the supervision of the Cardiology staff.  At peak effect of the drug, 30 mCi Tc-37m sestimibi injected intravenously and standard myocardial SPECT imaging performed.  Quantitative gated imaging also performed to evaluate left ventricular wall motion and estimate left ventricular ejection fraction.  Comparison:  10/30/2006  MYOCARDIAL IMAGING WITH SPECT (REST AND PHARMACOLOGIC-STRESS)  Findings:  There has been a marked change since the exam in 2008. The patient now has a large fixed defect involving the inferior wall and there is also a fixed defect along the anterior apical wall.  There is no evidence to suggest reversibility or ischemia.  GATED LEFT VENTRICULAR WALL MOTION STUDY  Findings:  Review of the gated images demonstrates severe hypokinesia with paradoxical motion along the anterior-septal wall.  LEFT VENTRICULAR EJECTION FRACTION  Findings:  QGS ejection fraction measures 19% , with an end- diastolic volume of 165 ml and an end-systolic volume of 133 ml.  IMPRESSION: No evidence for pharmacologically induced ischemia.  Large fixed defect involving the inferior wall and a fixed defect involving the anterior-apical wall.  Findings are compatible with areas of infarct.  These are new from the previous examination.  Abnormal wall motion with severe hypokinesia.  Evidence for paradoxical wall motion along the anterior-septal wall.   Original Report Authenticated By: Richarda Overlie, M.D.      Assessment:  1. Principal Problem: 2.   Acute respiratory failure- Bi pap on admission 3. Active Problems: 4.   HYPERLIPIDEMIA 5.   LOSS OF APPETITE 6.   Unstable angina 7.   CAD, STEMI July 2011 Rx'd with LAD BMS with moderate residual RCA/CFX disease. Cath 8/13- patent LAD 8.   Ischemic cardiomyopathy, EF 25-30% with STEMI, improved to 35-40% 2D Sept 2011 9.   LBBB (left bundle branch block) 10.   COPD by history and CXR 11.   History of CVA 2009 12.   Hypertensive urgency 13.   Elevated LFTs secondary to Rt Ht failure 14.   Acute combined systolic and diastolic congestive heart failure 15.   PVD, AAA 4.2 X 4.1cm by [place] 10/25/12 16.   Plan:  1. Seems to be responding well to a combination of increased dose diuretics and afterload reduction. Would continue IV diuresis today given brisk response and improvement in creatinine and consider switching to oral diuretics tomorrow. BP appears much improved.  Check BNP and liver enzymes with labs tomorrow. EPS has discussed CRT-D therapy which is recommended as an outpatient.  Time Spent Directly with Patient:  15 minutes  Length of Stay:  LOS: 4 days   Chrystie Nose, MD, [facility] Attending Cardiologist [facility]  HILTY,Kenneth C 10/29/2012, 8:22 AM

## 2012-10-30 LAB — BASIC METABOLIC PANEL
BUN: 48 mg/dL — ABNORMAL HIGH (ref 6–23)
CO2: 30 mEq/L (ref 19–32)
Calcium: 9.5 mg/dL (ref 8.4–10.5)
Chloride: 99 mEq/L (ref 96–112)
Creatinine, Ser: 1.32 mg/dL (ref 0.50–1.35)
Glucose, Bld: 88 mg/dL (ref 70–99)

## 2012-10-30 LAB — HEPATIC FUNCTION PANEL
AST: 88 U/L — ABNORMAL HIGH (ref 0–37)
Bilirubin, Direct: 0.1 mg/dL (ref 0.0–0.3)
Indirect Bilirubin: 0.4 mg/dL (ref 0.3–0.9)
Total Bilirubin: 0.5 mg/dL (ref 0.3–1.2)

## 2012-10-30 LAB — CBC
HCT: 35 % — ABNORMAL LOW (ref 39.0–52.0)
Hemoglobin: 12.2 g/dL — ABNORMAL LOW (ref 13.0–17.0)
MCH: 29.1 pg (ref 26.0–34.0)
MCV: 83.5 fL (ref 78.0–100.0)
Platelets: 202 10*3/uL (ref 150–400)
RBC: 4.19 MIL/uL — ABNORMAL LOW (ref 4.22–5.81)
WBC: 6.6 10*3/uL (ref 4.0–10.5)

## 2012-10-30 MED ORDER — FUROSEMIDE 40 MG PO TABS
40.0000 mg | ORAL_TABLET | Freq: Two times a day (BID) | ORAL | Status: DC
  Administered 2012-10-30 – 2012-11-01 (×5): 40 mg via ORAL
  Filled 2012-10-30 (×7): qty 1

## 2012-10-30 MED ORDER — MORPHINE SULFATE 2 MG/ML IJ SOLN
2.0000 mg | INTRAMUSCULAR | Status: DC | PRN
  Administered 2012-10-30: 2 mg via INTRAVENOUS
  Filled 2012-10-30: qty 1

## 2012-10-30 NOTE — Progress Notes (Addendum)
[facility]  Subjective: No complaints.  Objective: Vital signs in last 24 hours: Temp:  [98.6 F (37 C)-99.5 F (37.5 C)] 98.7 F (37.1 C) (04/11 0730) Pulse Rate:  [65-76] 76 (04/11 0730) Resp:  [18-22] 22 (04/11 0730) BP: (118-133)/(51-73) 118/61 mmHg (04/11 0730) SpO2:  [95 %-99 %] 95 % (04/11 0730) Weight:  [107 lb 12.9 oz (48.9 kg)] 107 lb 12.9 oz (48.9 kg) (04/11 0350) Last BM Date: 10/28/12  Intake/Output from previous day: 04/10 0701 - 04/11 0700 In: 320 [P.O.:320] Out: 1775 [Urine:1775] Intake/Output this shift: Total I/O In: -  Out: 225 [Urine:225]  Medications Current Facility-Administered Medications  Medication Dose Route Frequency Provider Last Rate Last Dose  . 0.9 %  sodium chloride infusion   Intravenous Continuous Olivia Mackie, MD      . 0.9 %  sodium chloride infusion  250 mL Intravenous PRN Therisa Doyne, MD      . acetaminophen (TYLENOL) tablet 650 mg  650 mg Oral Q4H PRN Therisa Doyne, MD   650 mg at 10/28/12 0432  . ALPRAZolam Prudy Feeler) tablet 0.25 mg  0.25 mg Oral TID PRN Abelino Derrick, PA-C   0.25 mg at 10/26/12 2003  . aspirin EC tablet 81 mg  81 mg Oral Daily Therisa Doyne, MD   81 mg at 10/29/12 0903  . carvedilol (COREG) tablet 6.25 mg  6.25 mg Oral BID WC Eda Paschal Kilroy, PA-C   6.25 mg at 10/29/12 1719  . clopidogrel (PLAVIX) tablet 75 mg  75 mg Oral Q breakfast Therisa Doyne, MD   75 mg at 10/29/12 0903  . furosemide (LASIX) injection 40 mg  40 mg Intravenous BID Chrystie Nose, MD   40 mg at 10/29/12 1720  . guaiFENesin (MUCINEX) 12 hr tablet 600 mg  600 mg Oral BID Therisa Doyne, MD   600 mg at 10/29/12 2222  . hydrALAZINE (APRESOLINE) injection 10 mg  10 mg Intravenous Q4H PRN Abelino Derrick, PA-C   10 mg at 10/25/12 1159  . hydrALAZINE (APRESOLINE) tablet 10 mg  10 mg Oral Q8H Nada Boozer, NP   10 mg at 10/30/12 0530  . isosorbide dinitrate (ISORDIL) tablet 10 mg  10 mg Oral Q8H Nada Boozer, NP   10 mg at 10/30/12 0300  . levalbuterol (XOPENEX) nebulizer solution 0.63 mg  0.63 mg Nebulization Q6H PRN Abelino Derrick, PA-C   0.63 mg at 10/25/12 1247  . ondansetron (ZOFRAN) injection 4 mg  4 mg Intravenous Q6H PRN Therisa Doyne, MD      . ramipril (ALTACE) capsule 10 mg  10 mg Oral Daily Therisa Doyne, MD   10 mg at 10/29/12 1021  . sodium chloride 0.9 % injection 3 mL  3 mL Intravenous Q12H Therisa Doyne, MD   3 mL at 10/29/12 2200  . sodium chloride 0.9 % injection 3 mL  3 mL Intravenous PRN Therisa Doyne, MD      . traMADol (ULTRAM) tablet 50 mg  50 mg Oral Q6H PRN Abelino Derrick, PA-C   50 mg at 10/26/12 1620  . zolpidem (AMBIEN) tablet 5 mg  5 mg Oral QHS PRN Abelino Derrick, PA-C        PE: General appearance: alert, cooperative and no distress Neck: no JVD Lungs: clear to auscultation bilaterally Heart: regular rate and rhythm Extremities: no LEE Skin: warm and dry  Lab Results:   Recent Labs  10/28/12 0452 10/29/12 0453 10/30/12 0520  WBC  5.3 6.5 6.6  HGB 11.5* 12.1* 12.2*  HCT 33.5* 34.9* 35.0*  PLT 187 194 202   BMET  Recent Labs  10/28/12 0452 10/29/12 0453 10/30/12 0520  NA 138 139 137  K 3.7 3.8 4.3  CL 102 101 99  CO2 28 29 30   GLUCOSE 74 82 88  BUN 41* 42* 48*  CREATININE 1.12 1.20 1.32  CALCIUM 8.8 9.1 9.5    Assessment/Plan    Principal Problem:   Acute respiratory failure- Bi pap on admission Active Problems:   HYPERLIPIDEMIA   LOSS OF APPETITE   Unstable angina   CAD, STEMI July 2011 Rx'd with LAD BMS with moderate residual RCA/CFX disease. Cath 8/13- patent LAD   Ischemic cardiomyopathy, EF 25-30% with STEMI, improved to 35-40% 2D Sept 2011   LBBB (left bundle branch block)   COPD by history and CXR   History of CVA 2009   Hypertensive urgency   Elevated LFTs secondary to Rt Ht failure   Acute combined systolic and diastolic congestive heart failure   PVD, AAA 4.2 X 4.1cm by [place] 10/25/12  Plan: Pt  diuresed an additional 1.7 L in past 24 hours. BNP is down to 1,549 ( 11K at time of admission). Lungs are clear, no JVD, no LEE. Transition to PO diuretic. Labs are WNL. Will transfer to 4700. Will have cardiac rehab to see. Plan for possible discharge in 1-2 days. Will arrange OP F/U at [facility].    LOS: 5 days    Brittainy M. Delmer Islam 10/30/2012 8:30 AM  Agree with note written by Boyce Medici  PAC  Good diuresis. VSS. BNP decreasing.Exam benign. Labs OK. Transfer to 4700. Transition to PO diuretics. CRH. Prom home Sunday. TCM 7. Apprec EPS input. Agree with elective OP CRT.   Runell Gess 10/30/2012 8:51 AM

## 2012-10-30 NOTE — Progress Notes (Signed)
Transferred to 4715 by wheelchair, belongings with family, report given to RN.

## 2012-10-30 NOTE — Progress Notes (Signed)
eLink Physician-Brief Progress Note Patient Name: [name] DOB: 1941/04/22 MRN: [id number]  Date of Service  10/30/2012   HPI/Events of Note   Best Practice  eICU Interventions  Order for SCDs while on bedrest.  May remove once fully ambulatory   Intervention Category Intermediate Interventions: Best-practice therapies (e.g. DVT, beta blocker, etc.)  DETERDING,ELIZABETH 10/30/2012, 1:14 AM

## 2012-10-31 LAB — CBC
MCH: 29 pg (ref 26.0–34.0)
MCV: 83 fL (ref 78.0–100.0)
Platelets: 198 10*3/uL (ref 150–400)
RBC: 4.24 MIL/uL (ref 4.22–5.81)
RDW: 14.2 % (ref 11.5–15.5)
WBC: 7.8 10*3/uL (ref 4.0–10.5)

## 2012-10-31 LAB — BASIC METABOLIC PANEL
CO2: 30 mEq/L (ref 19–32)
Calcium: 9.4 mg/dL (ref 8.4–10.5)
Chloride: 98 mEq/L (ref 96–112)
Creatinine, Ser: 1.27 mg/dL (ref 0.50–1.35)
GFR calc Af Amer: 63 mL/min — ABNORMAL LOW (ref >90)
Sodium: 138 mEq/L (ref 135–145)

## 2012-10-31 LAB — CK TOTAL AND CKMB (NOT AT ARMC)
CK, MB: 1.5 ng/mL (ref 0.3–4.0)
Total CK: 617 U/L — ABNORMAL HIGH (ref 7–232)

## 2012-10-31 NOTE — Progress Notes (Signed)
Subjective:  No CP/SOB  Objective:  Temp:  [97.9 F (36.6 C)-99.7 F (37.6 C)] 99.6 F (37.6 C) (04/12 0518) Pulse Rate:  [67-99] 92 (04/12 0518) Resp:  [20-22] 20 (04/12 0518) BP: (89-135)/(52-76) 112/64 mmHg (04/12 0518) SpO2:  [96 %-98 %] 96 % (04/12 0518) Weight:  [49.2 kg (108 lb 7.5 oz)] 49.2 kg (108 lb 7.5 oz) (04/12 0518) Weight change: 0.3 kg (10.6 oz)  Intake/Output from previous day: 04/11 0701 - 04/12 0700 In: 223 [P.O.:220; I.V.:3] Out: 225 [Urine:225]  Intake/Output from this shift: Total I/O In: 480 [P.O.:480] Out: -   Physical Exam: General appearance: alert and no distress Neck: no adenopathy, no carotid bruit, no JVD, supple, symmetrical, trachea midline and thyroid not enlarged, symmetric, no tenderness/mass/nodules Lungs: clear to auscultation bilaterally Heart: regular rate and rhythm, S1, S2 normal, no murmur, click, rub or gallop Extremities: extremities normal, atraumatic, no cyanosis or edema  Lab Results: Results for orders placed during the hospital encounter of 10/25/12 (from the past 48 hour(s))  CBC     Status: Abnormal   Collection Time    10/30/12  5:20 AM      Result Value Range   WBC 6.6  4.0 - 10.5 K/uL   RBC 4.19 (*) 4.22 - 5.81 MIL/uL   Hemoglobin 12.2 (*) 13.0 - 17.0 g/dL   HCT 16.1 (*) 09.6 - 04.5 %   MCV 83.5  78.0 - 100.0 fL   MCH 29.1  26.0 - 34.0 pg   MCHC 34.9  30.0 - 36.0 g/dL   RDW 40.9  81.1 - 91.4 %   Platelets 202  150 - 400 K/uL  BASIC METABOLIC PANEL     Status: Abnormal   Collection Time    10/30/12  5:20 AM      Result Value Range   Sodium 137  135 - 145 mEq/L   Potassium 4.3  3.5 - 5.1 mEq/L   Chloride 99  96 - 112 mEq/L   CO2 30  19 - 32 mEq/L   Glucose, Bld 88  70 - 99 mg/dL   BUN 48 (*) 6 - 23 mg/dL   Creatinine, Ser 7.82  0.50 - 1.35 mg/dL   Calcium 9.5  8.4 - 95.6 mg/dL   GFR calc non Af Amer 52 (*) >90 mL/min   GFR calc Af Amer 60 (*) >90 mL/min   Comment:            The eGFR has been calculated      using the CKD EPI equation.     This calculation has not been     validated in all clinical     situations.     eGFR's persistently     <90 mL/min signify     possible Chronic Kidney Disease.  HEPATIC FUNCTION PANEL     Status: Abnormal   Collection Time    10/30/12  5:20 AM      Result Value Range   Total Protein 8.0  6.0 - 8.3 g/dL   Albumin 3.5  3.5 - 5.2 g/dL   AST 88 (*) 0 - 37 U/L   ALT 91 (*) 0 - 53 U/L   Alkaline Phosphatase 81  39 - 117 U/L   Total Bilirubin 0.5  0.3 - 1.2 mg/dL   Bilirubin, Direct 0.1  0.0 - 0.3 mg/dL   Indirect Bilirubin 0.4  0.3 - 0.9 mg/dL  PRO B NATRIURETIC PEPTIDE     Status: Abnormal   Collection Time  10/30/12  5:20 AM      Result Value Range   Pro B Natriuretic peptide (BNP) 1549.0 (*) 0 - 125 pg/mL  CBC     Status: Abnormal   Collection Time    10/31/12  5:45 AM      Result Value Range   WBC 7.8  4.0 - 10.5 K/uL   RBC 4.24  4.22 - 5.81 MIL/uL   Hemoglobin 12.3 (*) 13.0 - 17.0 g/dL   HCT 16.1 (*) 09.6 - 04.5 %   MCV 83.0  78.0 - 100.0 fL   MCH 29.0  26.0 - 34.0 pg   MCHC 34.9  30.0 - 36.0 g/dL   RDW 40.9  81.1 - 91.4 %   Platelets 198  150 - 400 K/uL  BASIC METABOLIC PANEL     Status: Abnormal   Collection Time    10/31/12  5:45 AM      Result Value Range   Sodium 138  135 - 145 mEq/L   Potassium 4.1  3.5 - 5.1 mEq/L   Chloride 98  96 - 112 mEq/L   CO2 30  19 - 32 mEq/L   Glucose, Bld 96  70 - 99 mg/dL   BUN 44 (*) 6 - 23 mg/dL   Creatinine, Ser 7.82  0.50 - 1.35 mg/dL   Calcium 9.4  8.4 - 95.6 mg/dL   GFR calc non Af Amer 54 (*) >90 mL/min   GFR calc Af Amer 63 (*) >90 mL/min   Comment:            The eGFR has been calculated     using the CKD EPI equation.     This calculation has not been     validated in all clinical     situations.     eGFR's persistently     <90 mL/min signify     possible Chronic Kidney Disease.  CK TOTAL AND CKMB     Status: Abnormal   Collection Time    10/31/12  5:45 AM      Result  Value Range   Total CK 617 (*) 7 - 232 U/L   CK, MB 1.5  0.3 - 4.0 ng/mL   Relative Index 0.2  0.0 - 2.5  TROPONIN I     Status: None   Collection Time    10/31/12  5:45 AM      Result Value Range   Troponin I <0.30  <0.30 ng/mL   Comment:            Due to the release kinetics of cTnI,     a negative result within the first hours     of the onset of symptoms does not rule out     myocardial infarction with certainty.     If myocardial infarction is still suspected,     repeat the test at appropriate intervals.    Imaging: Imaging results have been reviewed  Assessment/Plan:   1. Principal Problem: 2.   Acute respiratory failure- Bi pap on admission 3. Active Problems: 4.   HYPERLIPIDEMIA 5.   LOSS OF APPETITE 6.   Unstable angina 7.   CAD, STEMI July 2011 Rx'd with LAD BMS with moderate residual RCA/CFX disease. Cath 8/13- patent LAD 8.   Ischemic cardiomyopathy, EF 25-30% with STEMI, improved to 35-40% 2D Sept 2011 9.   LBBB (left bundle branch block) 10.   COPD by history and CXR 11.   History of CVA 2009 12.  Hypertensive urgency 13.   Elevated LFTs secondary to Rt Ht failure 14.   Acute combined systolic and diastolic congestive heart failure 15.   PVD, AAA 4.2 X 4.1cm by [place] 10/25/12 16.   Time Spent Directly with Patient:  20 minutes  Length of Stay:  LOS: 6 days   No CP/SOB. Appears euvolemic. NSR with LBBB. Labs OK. Plan D/C home tomorrow. OP CRT by [place] EPS later later this month.  Runell Gess 10/31/2012, 9:47 AM

## 2012-10-31 NOTE — Progress Notes (Signed)
Called by RN during the night about pt with chest pain.  EKG per RN without changes.  Morphine given.  No further pain.  Cardiac enzymes this AM negative.

## 2012-11-01 LAB — BASIC METABOLIC PANEL
BUN: 50 mg/dL — ABNORMAL HIGH (ref 6–23)
Calcium: 9.2 mg/dL (ref 8.4–10.5)
Creatinine, Ser: 1.36 mg/dL — ABNORMAL HIGH (ref 0.50–1.35)
GFR calc Af Amer: 58 mL/min — ABNORMAL LOW (ref >90)
GFR calc non Af Amer: 50 mL/min — ABNORMAL LOW (ref >90)

## 2012-11-01 LAB — CBC
HCT: 33.4 % — ABNORMAL LOW (ref 39.0–52.0)
MCHC: 34.4 g/dL (ref 30.0–36.0)
MCV: 83.7 fL (ref 78.0–100.0)
Platelets: 205 10*3/uL (ref 150–400)
RDW: 14.1 % (ref 11.5–15.5)

## 2012-11-01 MED ORDER — RAMIPRIL 10 MG PO CAPS: 10.0000 mg | ORAL_CAPSULE | Freq: Every day | ORAL | Status: DC

## 2012-11-01 MED ORDER — ISOSORBIDE DINITRATE 10 MG PO TABS: 10.0000 mg | ORAL_TABLET | Freq: Three times a day (TID) | ORAL | Status: DC

## 2012-11-01 MED ORDER — FUROSEMIDE 40 MG PO TABS: 40.0000 mg | ORAL_TABLET | Freq: Two times a day (BID) | ORAL | Status: DC

## 2012-11-01 MED ORDER — ASPIRIN 81 MG PO TBEC: 81.0000 mg | DELAYED_RELEASE_TABLET | Freq: Every day | ORAL | Status: DC

## 2012-11-01 NOTE — Progress Notes (Signed)
[facility] Progress Note  Subjective:  No chest pain or SOB  Objective:   Vital Signs in the last 24 hours: Temp:  [98 F (36.7 C)-98.5 F (36.9 C)] 98 F (36.7 C) (04/13 0513) Pulse Rate:  [80-89] 86 (04/13 0513) Resp:  [18-20] 18 (04/13 0513) BP: (97-125)/(51-72) 114/62 mmHg (04/13 0513) SpO2:  [97 %-99 %] 98 % (04/13 0513) Weight:  [50.1 kg (110 lb 7.2 oz)] 50.1 kg (110 lb 7.2 oz) (04/13 0513)  Intake/Output from previous day: 04/12 0701 - 04/13 0700 In: 1083 [P.O.:1080; I.V.:3] Out: 125 [Urine:125]  Scheduled: . aspirin EC  81 mg Oral Daily  . carvedilol  6.25 mg Oral BID WC  . clopidogrel  75 mg Oral Q breakfast  . furosemide  40 mg Oral BID  . guaiFENesin  600 mg Oral BID  . hydrALAZINE  10 mg Oral Q8H  . isosorbide dinitrate  10 mg Oral Q8H  . ramipril  10 mg Oral Daily  . sodium chloride  3 mL Intravenous Q12H    Physical Exam:   General appearance: alert and no distress Neck: no adenopathy, no carotid bruit, no JVD and supple, symmetrical, trachea midline Lungs: clear to auscultation bilaterally Heart: regular rate and rhythm Abdomen: soft, non-tender; bowel sounds normal; no masses,  no organomegaly; pulsatile palpable aorta Extremities: no edema, redness or tenderness in the calves or thighs   Rate: 80  Rhythm: normal sinus rhythm  Lab Results:    Recent Labs  10/31/12 0545 11/01/12 0605  NA 138 137  K 4.1 3.9  CL 98 98  CO2 30 29  GLUCOSE 96 91  BUN 44* 50*  CREATININE 1.27 1.36*    Recent Labs  10/31/12 0545  TROPONINI <0.30   Hepatic Function Panel  Recent Labs  10/30/12 0520  PROT 8.0  ALBUMIN 3.5  AST 88*  ALT 91*  ALKPHOS 81  BILITOT 0.5  BILIDIR 0.1  IBILI 0.4   No results found for this basename: INR,  in the last 72 hours  Lipid Panel     Component Value Date/Time   CHOL 210* 03/14/2012 0505   TRIG 113 03/14/2012 0505   HDL 52 03/14/2012 0505   CHOLHDL 4.0 03/14/2012 0505   VLDL  23 03/14/2012 0505   LDLCALC 135* 03/14/2012 0505     Imaging:  No results found.    Assessment/Plan:   Principal Problem:   Acute respiratory failure- Bi pap on admission Active Problems:   HYPERLIPIDEMIA   LOSS OF APPETITE   Unstable angina   CAD, STEMI July 2011 Rx'd with LAD BMS with moderate residual RCA/CFX disease. Cath 8/13- patent LAD   Ischemic cardiomyopathy, EF 25-30% with STEMI, improved to 35-40% 2D Sept 2011   LBBB (left bundle branch block)   COPD by history and CXR   History of CVA 2009   Hypertensive urgency   Elevated LFTs secondary to Rt Ht failure   Acute combined systolic and diastolic congestive heart failure   PVD, AAA 4.2 X 4.1cm by [place] 10/25/12  No chest pain. Feels well NSR with LBBB stable. For DC today. As per Dr Allyson Sabal, for OP CRT by [facility] later this month.    Lennette Bihari, MD, [facility] 11/01/2012, 9:59 AM

## 2012-11-01 NOTE — Discharge Summary (Signed)
Physician Discharge Summary  Patient ID: [name] MRN: [id number] DOB/AGE: [birth year] 72 y.o.  Admit date: 10/25/2012 Discharge date: 11/04/2012  Admission Diagnoses: Unstable angina   Discharge Diagnoses:  Principal Problem:   Unstable angina Active Problems:   HYPERLIPIDEMIA   LOSS OF APPETITE   CAD, STEMI July 2011 Rx'd with LAD BMS with moderate residual RCA/CFX disease. Cath 8/13- patent LAD   Ischemic cardiomyopathy, EF 25-30% with STEMI, improved to 35-40% 2D Sept 2011   LBBB (left bundle branch block)   COPD by history and CXR   History of CVA 2009   Hypertensive urgency   Elevated LFTs secondary to Rt Ht failure   Acute combined systolic and diastolic congestive heart failure   Acute respiratory failure- Bi pap on admission   PVD, AAA 4.2 X 4.1cm by [place] 10/25/12   Discharged Condition: stable  Hospital Course:  The patient is a 72 year old [place] male with a history of coronary disease. He had an ST-elevation MI in July of 2011. At that time he had a total LAD that was treated with a bare metal stent. He had some moderate residual circumflex and RCA disease treated medically. His ejection fraction initially was 25-30%, this improved to 30-40% by echocardiogram in September of 2011. In March of 2013 he had more chest pain. He had a Myoview and an echo that were both unremarkable, and eventually he came into the hospital with chest pain in August 2013 and was re-studied. At that time it was felt that his LAD stent was patent. There was some residual RCA and OM disease that was possibly responsible for his angina. These were not amenable to intervention and he has been treated medically. His other medical history includes HTN, COPD and CVA in 2009. He also has a history of medical noncompliance. He has been followed at [facility] by Dr. Allyson Sabal. He presented to [facility] ER on 10/25/12 with complaints of chest pain and increasing SOB. An EKG showed a LBBB. He was initially taken to  the cath lab as a STEMI, but that was aborted as he became pain free and the LBBB was found to be old. Work-up also revealed severely elevated BP of 212/139. He was admitted to the CCU. He was placed on NTG gtt, and BP improved. His home antihypertensives were titrated up and a long acting nitrate was added, as well as PRN Hydralazine. His breathing improved with BiPAP. His BNP was elevated at 11K. He was started on Lasix.  POC troponin was mildly elevated at 0.29. He was placed on IV heparin. It was thought that his chest pain and elevated cardiac enzymes were due to increased filling pressures, secondary to severe hypertension. He ultimately ruled out for MI with negative troponins x 3. IV heparin was discharged. He underwent a NST for risk stratification. There was no evidence of pharmacologically induced ischemia. A 2D echo revealed an EF of 20-25%. He also underwent bilateral renal dopplers to assess for renal artery stenosis, but the study was normal. His BP ultimately improved. Electrophysiology was consulted to evaluate for CRT-D therapy, in the setting of decreased systolic function and LBBB. He was evaluated by Dr. Lewayne Bunting, who felt that he was a suitable candidate. It was decided to wait until the patient recovered from his acute HF exacerbation and to have the patient return for device implantation. The patient was continued on Lasix for diuresis. His BNP improved from 11K to 1,549. He had no further SOB. He was last seen  and examined by Dr. Tresa Endo, who felt that he was stable for discharge home. He is scheduled to undergo a Bi-Ventricular implant by Dr. Ladona Ridgel on 11/12/12.   Consults: electrophysiology  Significant Diagnostic Studies:   NST 10/27/12 IMPRESSION:  No evidence for pharmacologically induced ischemia.  Large fixed defect involving the inferior wall and a fixed defect  involving the anterior-apical wall. Findings are compatible with  areas of infarct. These are new from the  previous examination.  Abnormal wall motion with severe hypokinesia. Evidence for  paradoxical wall motion along the anterior-septal wall.  Original Report Authenticated By: Richarda Overlie, M.D.    2D Echo 10/25/12 Study Conclusions  - Left ventricle: The cavity size was mildly dilated. Systolic function was severely reduced. The estimated ejection fraction was in the range of 20% to 25%. Diffuse hypokinesis. E-A fusion due to first degree AV block does notallow evaluation of LV diastolic function. Systolic dominant pulmonary vein flow suggests normal mean left atrial pressure.. - Ventricular septum: Septal motion showed paradox. These changes are consistent with a left bundle branch block. - Mitral valve: Mild regurgitation. - Left atrium: The atrium was mildly dilated. - Right ventricle: The cavity size was mildly dilated. Wall thickness was normal. - Atrial septum: No defect or patent foramen ovale was identified. - Pericardium, extracardiac: A trivial pericardial effusion was identified posterior to the heart. There was a right pleural effusion.   Treatments: See Hospital Course  Discharge Exam: Blood pressure 114/62, pulse 86, temperature 98 F (36.7 C), temperature source Oral, resp. rate 18, height 5\' 8"  (1.727 m), weight 110 lb 7.2 oz (50.1 kg), SpO2 98.00%.   Disposition: 01-Home or Self Care      Discharge Orders   Future Orders Complete By Expires     Diet - low sodium heart healthy  As directed     Increase activity slowly  As directed         Medication List    STOP taking these medications       isosorbide mononitrate 60 MG 24 hr tablet  Commonly known as:  IMDUR      TAKE these medications       aspirin 81 MG EC tablet  Take 1 tablet (81 mg total) by mouth daily.     carvedilol 6.25 MG tablet  Commonly known as:  COREG  Take 6.25 mg by mouth 2 (two) times daily with a meal.     clopidogrel 75 MG tablet  Commonly known as:  PLAVIX  Take 1  tablet (75 mg total) by mouth daily with breakfast.     furosemide 40 MG tablet  Commonly known as:  LASIX  Take 1 tablet (40 mg total) by mouth 2 (two) times daily.     ibuprofen 200 MG tablet  Commonly known as:  ADVIL,MOTRIN  Take 200 mg by mouth 2 (two) times daily.     isosorbide dinitrate 10 MG tablet  Commonly known as:  ISORDIL  Take 1 tablet (10 mg total) by mouth every 8 (eight) hours.     nitroGLYCERIN 0.4 MG SL tablet  Commonly known as:  NITROSTAT  Place 1 tablet (0.4 mg total) under the tongue every 5 (five) minutes x 3 doses as needed for chest pain.     omeprazole 20 MG tablet  Commonly known as:  PRILOSEC OTC  Take 20 mg by mouth daily as needed.     ramipril 10 MG capsule  Commonly known as:  ALTACE  Take 1  capsule (10 mg total) by mouth daily.     rosuvastatin 20 MG tablet  Commonly known as:  CRESTOR  Take 1 tablet (20 mg total) by mouth daily.       Follow-up Information   Follow up with [facility]. (call our office to arrange follow-up appointment)    Contact information:   [address] [place] [address] [phone]     TIME SPENT ON DISCHARGE, INCLUDING PHYSICIAN TIME: > 30 MINUTES  Signed: Allayne Butcher, PA-C 11/04/2012, 10:55 AM

## 2012-11-11 MED ORDER — SODIUM CHLORIDE 0.9 % IR SOLN
80.0000 mg | Status: DC
  Filled 2012-11-11: qty 2

## 2012-11-11 MED ORDER — CEFAZOLIN SODIUM-DEXTROSE 2-3 GM-% IV SOLR
2.0000 g | INTRAVENOUS | Status: DC
  Filled 2012-11-11 (×2): qty 50

## 2012-11-12 ENCOUNTER — Encounter (HOSPITAL_COMMUNITY)
Admission: RE | Disposition: A | Discharge status: Home / Self Care | Payer: Self-pay | Source: Ambulatory Visit | Attending: Internal Medicine

## 2012-11-12 ENCOUNTER — Ambulatory Visit (HOSPITAL_COMMUNITY)
Admission: RE | Admit: 2012-11-12 | Discharge: 2012-11-13 | Disposition: A | Discharge status: Home / Self Care | Payer: Medicare PPO | Source: Ambulatory Visit | Attending: Internal Medicine | Admitting: Internal Medicine

## 2012-11-12 ENCOUNTER — Encounter (HOSPITAL_COMMUNITY): Payer: Self-pay | Admitting: General Practice

## 2012-11-12 DIAGNOSIS — I5023 Acute on chronic systolic (congestive) heart failure: Secondary | ICD-10-CM | POA: Insufficient documentation

## 2012-11-12 DIAGNOSIS — R0789 Other chest pain: Secondary | ICD-10-CM

## 2012-11-12 DIAGNOSIS — R630 Anorexia: Secondary | ICD-10-CM

## 2012-11-12 DIAGNOSIS — Z9119 Patient's noncompliance with other medical treatment and regimen: Secondary | ICD-10-CM

## 2012-11-12 DIAGNOSIS — J9600 Acute respiratory failure, unspecified whether with hypoxia or hypercapnia: Secondary | ICD-10-CM

## 2012-11-12 DIAGNOSIS — I2589 Other forms of chronic ischemic heart disease: Secondary | ICD-10-CM

## 2012-11-12 DIAGNOSIS — I5022 Chronic systolic (congestive) heart failure: Secondary | ICD-10-CM

## 2012-11-12 DIAGNOSIS — I679 Cerebrovascular disease, unspecified: Secondary | ICD-10-CM

## 2012-11-12 DIAGNOSIS — I5041 Acute combined systolic (congestive) and diastolic (congestive) heart failure: Secondary | ICD-10-CM

## 2012-11-12 DIAGNOSIS — I739 Peripheral vascular disease, unspecified: Secondary | ICD-10-CM

## 2012-11-12 DIAGNOSIS — I255 Ischemic cardiomyopathy: Secondary | ICD-10-CM

## 2012-11-12 DIAGNOSIS — I200 Unstable angina: Secondary | ICD-10-CM

## 2012-11-12 DIAGNOSIS — Z8673 Personal history of transient ischemic attack (TIA), and cerebral infarction without residual deficits: Secondary | ICD-10-CM | POA: Insufficient documentation

## 2012-11-12 DIAGNOSIS — E785 Hyperlipidemia, unspecified: Secondary | ICD-10-CM

## 2012-11-12 DIAGNOSIS — Z9861 Coronary angioplasty status: Secondary | ICD-10-CM | POA: Insufficient documentation

## 2012-11-12 DIAGNOSIS — G8190 Hemiplegia, unspecified affecting unspecified side: Secondary | ICD-10-CM

## 2012-11-12 DIAGNOSIS — J4489 Other specified chronic obstructive pulmonary disease: Secondary | ICD-10-CM | POA: Insufficient documentation

## 2012-11-12 DIAGNOSIS — I252 Old myocardial infarction: Secondary | ICD-10-CM | POA: Insufficient documentation

## 2012-11-12 DIAGNOSIS — Z955 Presence of coronary angioplasty implant and graft: Secondary | ICD-10-CM

## 2012-11-12 DIAGNOSIS — I2510 Atherosclerotic heart disease of native coronary artery without angina pectoris: Secondary | ICD-10-CM | POA: Insufficient documentation

## 2012-11-12 DIAGNOSIS — I447 Left bundle-branch block, unspecified: Secondary | ICD-10-CM | POA: Insufficient documentation

## 2012-11-12 DIAGNOSIS — I509 Heart failure, unspecified: Secondary | ICD-10-CM | POA: Insufficient documentation

## 2012-11-12 DIAGNOSIS — J449 Chronic obstructive pulmonary disease, unspecified: Secondary | ICD-10-CM | POA: Insufficient documentation

## 2012-11-12 DIAGNOSIS — I10 Essential (primary) hypertension: Secondary | ICD-10-CM

## 2012-11-12 DIAGNOSIS — I160 Hypertensive urgency: Secondary | ICD-10-CM

## 2012-11-12 HISTORY — DX: Angina pectoris, unspecified: I20.9

## 2012-11-12 HISTORY — PX: BI-VENTRICULAR IMPLANTABLE CARDIOVERTER DEFIBRILLATOR  (CRT-D): SHX5747

## 2012-11-12 HISTORY — DX: Sleep apnea, unspecified: G47.30

## 2012-11-12 HISTORY — PX: BI-VENTRICULAR IMPLANTABLE CARDIOVERTER DEFIBRILLATOR: SHX5459

## 2012-11-12 HISTORY — DX: ST elevation (STEMI) myocardial infarction involving other coronary artery of anterior wall: I21.09

## 2012-11-12 HISTORY — DX: Presence of cardiac pacemaker: Z95.0

## 2012-11-12 HISTORY — DX: Presence of automatic (implantable) cardiac defibrillator: Z95.810

## 2012-11-12 LAB — SURGICAL PCR SCREEN: MRSA, PCR: NEGATIVE

## 2012-11-12 SURGERY — BI-VENTRICULAR IMPLANTABLE CARDIOVERTER DEFIBRILLATOR  (CRT-D)
Anesthesia: LOCAL

## 2012-11-12 MED ORDER — ACETAMINOPHEN 325 MG PO TABS
325.0000 mg | ORAL_TABLET | ORAL | Status: DC | PRN
  Administered 2012-11-12: 650 mg via ORAL
  Filled 2012-11-12: qty 2

## 2012-11-12 MED ORDER — ONDANSETRON HCL 4 MG/2ML IJ SOLN: 4.0000 mg | Freq: Four times a day (QID) | INTRAMUSCULAR | Status: DC | PRN

## 2012-11-12 MED ORDER — CLOPIDOGREL BISULFATE 75 MG PO TABS
75.0000 mg | ORAL_TABLET | Freq: Every day | ORAL | Status: DC
  Administered 2012-11-13: 75 mg via ORAL
  Filled 2012-11-12: qty 1

## 2012-11-12 MED ORDER — MIDAZOLAM HCL 5 MG/5ML IJ SOLN
INTRAMUSCULAR | Status: AC
  Filled 2012-11-12: qty 5

## 2012-11-12 MED ORDER — SODIUM CHLORIDE 0.9 % IV SOLN: INTRAVENOUS | Status: DC

## 2012-11-12 MED ORDER — ATORVASTATIN CALCIUM 10 MG PO TABS
10.0000 mg | ORAL_TABLET | Freq: Every day | ORAL | Status: DC
  Administered 2012-11-12: 10 mg via ORAL
  Filled 2012-11-12 (×2): qty 1

## 2012-11-12 MED ORDER — YOU HAVE A PACEMAKER BOOK
Freq: Once | Status: AC
  Administered 2012-11-13
  Filled 2012-11-12: qty 1

## 2012-11-12 MED ORDER — MUPIROCIN 2 % EX OINT
TOPICAL_OINTMENT | CUTANEOUS | Status: AC
  Administered 2012-11-12: 1 via NASAL
  Filled 2012-11-12: qty 22

## 2012-11-12 MED ORDER — LIDOCAINE HCL (PF) 1 % IJ SOLN
INTRAMUSCULAR | Status: AC
  Filled 2012-11-12: qty 60

## 2012-11-12 MED ORDER — MUPIROCIN 2 % EX OINT: TOPICAL_OINTMENT | Freq: Two times a day (BID) | CUTANEOUS | Status: DC

## 2012-11-12 MED ORDER — CARVEDILOL 6.25 MG PO TABS
6.2500 mg | ORAL_TABLET | Freq: Two times a day (BID) | ORAL | Status: DC
  Administered 2012-11-12 – 2012-11-13 (×2): 6.25 mg via ORAL
  Filled 2012-11-12 (×4): qty 1

## 2012-11-12 MED ORDER — ISOSORBIDE DINITRATE 10 MG PO TABS
10.0000 mg | ORAL_TABLET | Freq: Three times a day (TID) | ORAL | Status: DC
  Administered 2012-11-12 – 2012-11-13 (×3): 10 mg via ORAL
  Filled 2012-11-12 (×6): qty 1

## 2012-11-12 MED ORDER — HYDROCODONE-ACETAMINOPHEN 5-325 MG PO TABS
1.0000 | ORAL_TABLET | Freq: Four times a day (QID) | ORAL | Status: DC | PRN
  Administered 2012-11-12 – 2012-11-13 (×2): 1 via ORAL
  Filled 2012-11-12 (×2): qty 1

## 2012-11-12 MED ORDER — CHLORHEXIDINE GLUCONATE 4 % EX LIQD: 60.0000 mL | Freq: Once | CUTANEOUS | Status: DC

## 2012-11-12 MED ORDER — CEFAZOLIN SODIUM 1-5 GM-% IV SOLN
1.0000 g | Freq: Four times a day (QID) | INTRAVENOUS | Status: AC
  Administered 2012-11-12 – 2012-11-13 (×3): 1 g via INTRAVENOUS
  Filled 2012-11-12 (×3): qty 50

## 2012-11-12 MED ORDER — SODIUM CHLORIDE 0.9 % IV SOLN
INTRAVENOUS | Status: DC
  Administered 2012-11-12: 10:00:00 via INTRAVENOUS

## 2012-11-12 MED ORDER — NITROGLYCERIN 0.4 MG SL SUBL: 0.4000 mg | SUBLINGUAL_TABLET | SUBLINGUAL | Status: DC | PRN

## 2012-11-12 MED ORDER — RAMIPRIL 10 MG PO CAPS
10.0000 mg | ORAL_CAPSULE | Freq: Every day | ORAL | Status: DC
  Administered 2012-11-12 – 2012-11-13 (×2): 10 mg via ORAL
  Filled 2012-11-12 (×2): qty 1

## 2012-11-12 MED ORDER — FUROSEMIDE 40 MG PO TABS
40.0000 mg | ORAL_TABLET | Freq: Two times a day (BID) | ORAL | Status: DC
  Administered 2012-11-12 – 2012-11-13 (×2): 40 mg via ORAL
  Filled 2012-11-12 (×4): qty 1

## 2012-11-12 MED ORDER — FENTANYL CITRATE 0.05 MG/ML IJ SOLN
INTRAMUSCULAR | Status: AC
  Filled 2012-11-12: qty 2

## 2012-11-12 NOTE — H&P (View-Only) (Signed)
ELECTROPHYSIOLOGY CONSULT NOTE  Patient ID: [name] MRN: [id number], DOB/AGE: [birth year]   Admit date: 10/25/2012 Date of Consult: 10/28/2012  Primary Cardiologist: Nanetta Batty, MD Reason for Consultation: Possible CRT therapy  History of Present Illness Mr. [name] is a 72 year old man with an ischemic CM, EF 20-25% initially diagnosed in Sept 2011, CAD s/p anterior wall MI, LBBB, COPD, HTN and prior CVA who has been admitted with acute on chronic systolic HF. He speaks [place] and consents to his daughter translating for him. He reports increasing SOB and chest pain x 3 weeks. He has also had increasing orthopnea and PND. He denies LE swelling. He denies palpitations, dizziness, near syncope or syncope. His SOB has improved with IV diuresis. His activity is limited by SOB with minimal exertion. This also has worsened over the last 3 weeks. Mr. Payne daughter reports he seemed to be getting along fine until 3 weeks ago and was actually able to walk around the track at a local school. He reports walking "slowly" approximately 1 mile daily when the weather is nice. He does not climb stairs.    Past Medical History Past Medical History  Diagnosis Date  . Hypertension   . Ischemic cardiomyopathy Sept 2011    EF improved to 35-40% 2D  . COPD (chronic obstructive pulmonary disease)   . CVA (cerebral infarction) 2009  . LBBB (left bundle branch block)   . Stroke   . History of acute anterior wall MI 2011    s/p PCI to LAD; 2.25 x 12 mm BMS    Past Surgical History Past Surgical History  Procedure Laterality Date  . Coronary stent placement  February 12 2010    LAD BMS     Allergies/Intolerances No Known Allergies  Inpatient Medications . aspirin EC  81 mg Oral Daily  . carvedilol  6.25 mg Oral BID WC  . clopidogrel  75 mg Oral Q breakfast  . furosemide  40 mg Intravenous Daily  . guaiFENesin  600 mg Oral BID  . hydrALAZINE  10 mg Oral Q8H  . isosorbide dinitrate  10 mg Oral Q8H  .  ramipril  10 mg Oral Daily  . sodium chloride  3 mL Intravenous Q12H   . sodium chloride 10 mL/hr at 10/27/12 2157  . heparin 850 Units/hr (10/26/12 0550)    Family History Positive for CAD   Social History Social History  . Marital Status: Married   Social History Main Topics  . Smoking status: Former Smoker    Quit date: 03/13/2009  . Smokeless tobacco: No  . Alcohol Use: No  . Drug Use: No   Review of Systems General: No chills, fever, night sweats or weight changes  Cardiovascular:  No chest pain, dyspnea on exertion, edema, orthopnea, palpitations, paroxysmal nocturnal dyspnea Dermatological: No rash, lesions or masses Respiratory: No cough, dyspnea Urologic: No hematuria, dysuria Abdominal: No nausea, vomiting, diarrhea, bright red blood per rectum, melena, or hematemesis Neurologic: No visual changes, weakness, changes in mental status All other systems reviewed and are otherwise negative except as noted above.  Physical Exam Blood pressure 124/86, pulse 88, temperature 97.6 F (36.4 C), temperature source Oral, resp. rate 14, height 5\' 9"  (1.753 m), weight 114 lb 3.2 oz (51.8 kg), SpO2 96.00%.  General: Well developed, thin 72 year old male in no acute distress. HEENT: Normocephalic, atraumatic. EOMs intact. Sclera nonicteric. Oropharynx clear.  Neck: Supple. +JVD. Lungs: Respirations regular and unlabored. Diminished breath sounds but CTA bilaterally. No  wheezes, rales or rhonchi. Heart: RRR. S1, S2 present. No murmurs, rub, S3 or S4. Abdomen: Soft, non-distended. BS present x 4 quadrants.  Extremities: No clubbing, cyanosis or edema. DP/PT/Radials 2+ and equal bilaterally. Psych: Normal affect. Neuro: Alert and oriented X 3. Moves all extremities spontaneously. Musculoskeletal: No kyphosis. Skin: Intact. Warm and dry. No rashes or petechiae in exposed areas.   Labs  ProBNP 10283  Recent Labs  10/27/12 1414 10/27/12 1731 10/27/12 2231 10/28/12 0452    TROPONINI <0.30 <0.30 <0.30 <0.30   Lab Results  Component Value Date   WBC 5.3 10/28/2012   HGB 11.5* 10/28/2012   HCT 33.5* 10/28/2012   MCV 83.5 10/28/2012   PLT 187 10/28/2012    Recent Labs Lab 10/25/12 0111  10/28/12 0452  NA 140  < > 138  K 4.4  < > 3.7  CL 108  < > 102  CO2 21  < > 28  BUN 26*  < > 41*  CREATININE 1.17  < > 1.12  CALCIUM 9.1  < > 8.8  PROT 7.8  --   --   BILITOT 0.3  --   --   ALKPHOS 95  --   --   ALT 149*  --   --   AST 217*  --   --   GLUCOSE 157*  < > 74  < > = values in this interval not displayed.  Recent Labs  10/26/12 0512  TSH 1.764    Radiology/Studies Nm Myocar Multi W/spect W/wall Motion / EF 10/27/2012   Findings:  There has been a marked change since the exam in 2008. The patient now has a large fixed defect involving the inferior wall and there is also a fixed defect along the anterior apical wall.  There is no evidence to suggest reversibility or ischemia.  GATED LEFT VENTRICULAR WALL MOTION STUDY  Findings:  Review of the gated images demonstrates severe hypokinesia with paradoxical motion along the anterior-septal wall.  LEFT VENTRICULAR EJECTION FRACTION  Findings:  QGS ejection fraction measures 19% , with an end- diastolic volume of 165 ml and an end-systolic volume of 133 ml.  IMPRESSION: No evidence for pharmacologically induced ischemia.  Large fixed defect involving the inferior wall and a fixed defect involving the anterior-apical wall.  Findings are compatible with areas of infarct.  These are new from the previous examination.  Abnormal wall motion with severe hypokinesia.  Evidence for paradoxical wall motion along the anterior-septal wall.     Dg Chest Port 1 View 10/25/2012  *RADIOLOGY REPORT*  Clinical Data: Shortness of breath  PORTABLE CHEST - 1 VIEW  Comparison: 03/13/2012  Findings: Emphysematous changes and cardiac enlargement similar previous study.  There is interval development of pulmonary venous congestion with  interstitial changes suggesting interstitial edema. No focal consolidation.  No blunting of costophrenic angles.  No pneumothorax.  Calcification of the aorta.  IMPRESSION: Cardiac enlargement with developing pulmonary vascular congestion and interstitial edema since previous study.  Emphysematous changes.   Original Report Authenticated By: Burman Nieves, M.D.     Echocardiogram  Study Conclusions - Left ventricle: The cavity size was mildly dilated. Systolic function was severely reduced. The estimated ejection fraction was in the range of 20% to 25%. Diffuse hypokinesis. E-A fusion due to first degree AV block does notallow evaluation of LV diastolic function. Systolic dominant pulmonary vein flow suggests normal mean left atrial pressure.. - Ventricular septum: Septal motion showed paradox. These changes are consistent with a left  bundle branch block. - Mitral valve: Mild regurgitation. - Left atrium: The atrium was mildly dilated. - Right ventricle: The cavity size was mildly dilated. Wall thickness was normal. - Atrial septum: No defect or patent foramen ovale was identified. - Pericardium, extracardiac: A trivial pericardial effusion was identified posterior to the heart. There was a right pleural effusion.   12-lead ECG on admission shows sinus tachycardia with LBBB, QRS 148 msec  Telemetry shows SR with LBBB; occasional PVCs   Assessment and Plan 1. Acute on chronic systolic HF 2. Ischemic CM, EF 20-25% 3. CAD s/p prior MI 4. LBBB, QRS ~150 msec 5. COPD 6. PVD Mr. Gregori meets criteria for BiV device/CRT given his longstanding ischemic CM, chronic systolic HF and LBBB. Discussed BiV PPM versus BiV ICD with Mr. Bogus and his daughter. Dr. Ladona Ridgel to see and discuss further.   Signed, Rick Duff, PA-C 10/28/2012, 10:21 AM  EP Attending  Patient seen and examined. Agree with above history, exam, assessment and plan. He has a long standing ICM, chronic systolic CHF,  EF 25%, and LBBB. I would treat his CHF aggressively, and allow patient to be discharged home and return in 2 weeks for device implant. I will try to arrange if he is willing.  Leonia Reeves.D.

## 2012-11-12 NOTE — Op Note (Signed)
BiV ICD implant via the left subclavian vein without immediate complication. [id number].

## 2012-11-12 NOTE — Interval H&P Note (Signed)
History and Physical Interval Note:  11/12/2012 12:10 PM  [name]  has presented today for surgery, with the diagnosis of cardiomyopathy  The various methods of treatment have been discussed with the patient and family. After consideration of risks, benefits and other options for treatment, the patient has consented to  Procedure(s): BI-VENTRICULAR IMPLANTABLE CARDIOVERTER DEFIBRILLATOR  (CRT-D) (N/A) as a surgical intervention .  The patient's history has been reviewed, patient examined, no change in status, stable for surgery.  I have reviewed the patient's chart and labs.  Questions were answered to the patient's satisfaction.     Leonia Reeves.D.

## 2012-11-12 NOTE — Progress Notes (Signed)
[name] and his family were instructed on his activity limitations - keeping left arm in sling and limiting movement and no lying in left side.

## 2012-11-13 ENCOUNTER — Telehealth: Payer: Self-pay | Admitting: Physician Assistant

## 2012-11-13 ENCOUNTER — Ambulatory Visit (HOSPITAL_COMMUNITY): Payer: Medicare PPO

## 2012-11-13 DIAGNOSIS — I2589 Other forms of chronic ischemic heart disease: Secondary | ICD-10-CM

## 2012-11-13 DIAGNOSIS — Z95810 Presence of automatic (implantable) cardiac defibrillator: Secondary | ICD-10-CM

## 2012-11-13 NOTE — Op Note (Signed)
NAMEDAMAREA, MERKEL NO.:  [id number]  MEDICAL RECORD NO.:  [id number]  LOCATION:  [id number]                         FACILITY:  [facility]  PHYSICIAN:  Doylene Canning. Ladona Ridgel, MD    DATE OF BIRTH:  16-Jul-1941  DATE OF PROCEDURE:  11/12/2012 DATE OF DISCHARGE:                              OPERATIVE REPORT   PROCEDURE PERFORMED:  Insertion of a biventricular implantable cardioverter-defibrillator.  INDICATION:  Longstanding ischemic cardiomyopathy, chronic systolic heart failure, left bundle-branch block ejection fraction of 20% despite maximal medical therapy with beta-blockers and ACE inhibitors.  INTRODUCTION:  The patient is a very pleasant 72 year old [place] man with a longstanding history of coronary artery disease, status post stenting in the remote past, status post multiple hospitalizations for congestive heart failure.  The patient is now admitted for insertion of a biventricular ICD in the setting of class III heart failure and left bundle-branch block, QRS duration 150 msec.  PROCEDURE:  After informed was obtained, the patient was taken to the diagnostic EP lab in a fasting state.  After usual preparation draping, intravenous fentanyl and midazolam was given for sedation.  A 30 mL of lidocaine was infiltrated into the left infraclavicular region. Multiple attempts to puncture the left subclavian vein were unsuccessful.  Venography of the coronary sinus was then carried out demonstrating a left subclavian vein that was superiorly displaced.  It was then punctured x3 and the Medtronic model 6935, 58 cm active fixation defibrillation lead, serial number [id number] being advanced into the right ventricle and a Medtronic model 5076, 45 cm active fixation pacing lead, serial number [id number] being advanced to the right atrium.  Mapping was carried out in the right ventricle.  At the final site, the R-waves measured 16 mV.  The pacing impedance with lead actively  fixed was 750 ohms and a threshold 0.6 V at 0.5 milliseconds. At this point, attention was then turned to the atrial lead, was placed in anterolateral portion.  The right atrium where P-waves measured 2 mV. The pacing impedance 500 ohms and the threshold was initially 1.2 V at 0.5 milliseconds.  A 10 V pacing did not stimulate the diaphragm.  With the atrial and ventricular leads in satisfactory position, attention was then turned to place the left ventricular lead.  The Medtronic MB2 guiding catheter along with a 6-French hexapolar EP catheter was advanced into the right atrium.  The coronary sinus was cannulated with a modest amount of difficulty.  The coronary sinus was displaced somewhat more superiorly than would be expected.  Venography of the coronary sinus was then carried out demonstrating a lateral vein, which was selected for LV lead placement.  The Medtronic model [id number] cm bipolar pacing lead, serial number [id number] was advanced into the coronary sinus and then by way of an angioplasty 014 guidewire advanced over the guidewire and into the lateral vein where it was positioned approximately halfway from the base to the apex.  In this location, there was no diaphragmatic stimulation.  The pacing threshold in the left ventricle was less than a V to 0.5 milliseconds and with these satisfactory parameters, the lead was liberated from the guiding catheter  in the usual manner.  The leads were secured to the subpectoral fascia with a figure-of-eight silk suture and the sewing sleeve was secured with silk suture.  Electrocautery was utilized to assure hemostasis.  A subcutaneous pocket was then made and antibiotic irrigation was utilized to irrigate the pocket.  At this point, the Medtronic Viva XTCRTD biventricular ICD serial number, [id number] was connected to the right atrial, right ventricular and left ventricular leads and placed back in the subcutaneous pocket where it was  secured with silk suture.  The pocket was irrigated with additional irrigation and the incision closed with 2-0 and 3-0 Vicryl.  At this point, I scrubbed out of the case to supervise defibrillation threshold testing.  After the patient was more deeply sedated under my direct supervision, with additional intravenous fentanyl and Versed, VF was induced with a T- wave shock.  A 20 joule shock was delivered, which terminated ventricular fibrillation and restored sinus rhythm.  No additional defibrillation threshold testing was carried out, and the benzoin Steri- Strips were painted on the skin.  A pressure dressing applied and the patient was returned to his room in satisfactory condition.  COMPLICATIONS:  There were no immediate procedure complications for results demonstrate successful implantation of a Medtronic biventricular ICD in a patient with a longstanding ischemic cardiomyopathy, ejection fraction 20% despite maximal medical therapy, left bundle-branch block, and a QRS duration of 150 milliseconds with class III congestive heart failure despite maximal medical therapy with beta-blockers and ACE inhibitors.     Doylene Canning. Ladona Ridgel, MD     GWT/MEDQ  D:  11/12/2012  T:  11/13/2012  Job:  [id number]  cc:   Italy Hilty, MD

## 2012-11-13 NOTE — Discharge Summary (Signed)
ELECTROPHYSIOLOGY PROCEDURE DISCHARGE SUMMARY    Patient ID: [name],  MRN: [id number], DOB/AGE: Oct 20, 1940 72 y.o.  Admit date: 11/12/2012 Discharge date: 11/13/2012  Primary Cardiologist: Nanetta Batty, MD Electrophysiologist: Lewayne Bunting, MD  Primary Discharge Diagnosis:  Ischemic cardiomyopathy, LBBB, and chronic systolic heart failure status post CRTD implant this admission  Secondary Discharge Diagnosis:  1.  Hypertension 2.  COPD 3.  CVA in 2009 4.  CAD- anterior wall MI in 2009 s/p PCI to LAD in 2011  Procedures This Admission:  1.  Implantation of a cardiac resynchronization therapy defibrillator on 11-12-2012 by Dr Ladona Ridgel.  The patient received a Medtronic Viva CRTD with model number 5076 right atrial lead, 6935 right ventricular lead, and 4194 left ventricular lead.  DFTs were successful at 20J.  There were no early apparent complications. 2.  CXR on 11-13-2012 demonstrated no pneumothorax status post device implant.   Brief HPI: The patient is a very pleasant 72 year old [place] man with a longstanding history of coronary artery disease, status post stenting in the remote past, status post multiple hospitalizations for  congestive heart failure. The patient is now admitted for insertion of a biventricular ICD in the setting of class III heart failure and left bundle-branch block, QRS duration 150 msec.  Hospital Course:  The patient was admitted and underwent implantation of a Medtronic CRTD with details as outlined above.   He was monitored on telemetry overnight which demonstrated sinus rhythm with ventricular pacing.  Left chest was without hematoma or ecchymosis.  The device was interrogated and found to be functioning normally.  CXR was obtained and demonstrated no pneumothorax status post device implantation.  Wound care, arm mobility, and restrictions were reviewed with the patient.  Dr Ladona Ridgel examined the patient and considered them stable for discharge to  home.    Discharge Vitals: Blood pressure 144/92, pulse 73, temperature 97.9 F (36.6 C), temperature source Oral, resp. rate 18, height 5\' 7"  (1.702 m), weight 117 lb 11.6 oz (53.4 kg), SpO2 96.00%.    Labs:   Lab Results  Component Value Date   WBC 7.1 11/01/2012   HGB 11.5* 11/01/2012   HCT 33.4* 11/01/2012   MCV 83.7 11/01/2012   PLT 205 11/01/2012   Lab Results  Component Value Date   CKTOTAL 617* 10/31/2012   CKMB 1.5 10/31/2012   TROPONINI <0.30 10/31/2012    Discharge Medications:    Medication List    TAKE these medications       carvedilol 6.25 MG tablet  Commonly known as:  COREG  Take 6.25 mg by mouth 2 (two) times daily with a meal.     clopidogrel 75 MG tablet  Commonly known as:  PLAVIX  Take 1 tablet (75 mg total) by mouth daily with breakfast.     furosemide 40 MG tablet  Commonly known as:  LASIX  Take 40 mg by mouth 2 (two) times daily.     isosorbide dinitrate 10 MG tablet  Commonly known as:  ISORDIL  Take 1 tablet (10 mg total) by mouth every 8 (eight) hours.     nitroGLYCERIN 0.4 MG SL tablet  Commonly known as:  NITROSTAT  Place 1 tablet (0.4 mg total) under the tongue every 5 (five) minutes x 3 doses as needed for chest pain.     omeprazole 20 MG tablet  Commonly known as:  PRILOSEC OTC  Take 20 mg by mouth daily as needed (heart burn).     ramipril 10 MG capsule  Commonly known as:  ALTACE  Take 1 capsule (10 mg total) by mouth daily.     rosuvastatin 20 MG tablet  Commonly known as:  CRESTOR  Take 1 tablet (20 mg total) by mouth daily.        Disposition:      Discharge Orders   Future Appointments Provider Department Dept Phone   11/26/2012 10:30 AM [facility] [employer] Main Office [place]) [phone]   02/19/2013 11:45 AM Marinus Maw, MD [facility] Main Office [place]) [phone]   Future Orders Complete By Expires     Diet - low sodium heart healthy  As directed     Discharge instructions  As  directed     Comments:      Please see post BiV ICD implant discharge instructions    Increase activity slowly  As directed       Follow-up Information   Follow up with [facility] On 11/26/2012. (At 10:30 AM for wound check)    Contact information:   [address] [place] [address] [phone]      Follow up with Lewayne Bunting, MD On 02/19/2013. (At 11:45 AM)    Contact information:   [address] [place] [address] [phone]      Duration of Discharge Encounter: Greater than 30 minutes including physician time.  Signed, Gypsy Balsam, RN, BSN 11/13/2012, 12:10 PM  EP Attending  Patient seen and examined. Agree with above. Leonia Reeves.D.

## 2012-11-13 NOTE — Progress Notes (Signed)
All patient/family teaching, including DC teaching, done with assistance of interpretor.

## 2012-11-13 NOTE — Progress Notes (Signed)
   ELECTROPHYSIOLOGY ROUNDING NOTE    Patient Name: Hilman Kissling Date of Encounter: 11-13-2012    SUBJECTIVE:Patient feels well.  No chest pain or shortness of breath.  Minimal incisional soreness.  S/p CRTD implant 11-12-2012  TELEMETRY: Reviewed telemetry pt in sinus rhythm with ventricular pacing.  Occasional PVC's Filed Vitals:   11/12/12 1800 11/12/12 1900 11/12/12 2040 11/13/12 0040  BP: 167/93  137/76 115/71  Pulse: 77 85 82 78  Temp:   98.7 F (37.1 C) 98.2 F (36.8 C)  TempSrc:   Oral Oral  Resp:   17 17  Height:      Weight:    117 lb 11.6 oz (53.4 kg)  SpO2: 100% 100% 100% 97%    Intake/Output Summary (Last 24 hours) at 11/13/12 0540 Last data filed at 11/12/12 2100  Gross per 24 hour  Intake    310 ml  Output    525 ml  Net   -215 ml    Radiology/Studies:  Pending  PHYSICAL EXAM Left chest without hematoma or ecchymosis.   DEVICE INTERROGATION: Device interrogation - normal biv ICD function  Wound care, arm mobility, restrictions reviewed with patient.  Routine follow up scheduled.   EP Attending  Patient seen and examined. Agree with above. CXR and interogation look good. Ok for discharge with usual followup.  Leonia Reeves.D.

## 2012-11-13 NOTE — Telephone Encounter (Signed)
Rec'd a call from ans svc re: pain post-CRTD insertion.   Tried to call multiple times but line was busy.

## 2012-11-26 ENCOUNTER — Ambulatory Visit (INDEPENDENT_AMBULATORY_CARE_PROVIDER_SITE_OTHER): Payer: Medicare PPO | Admitting: *Deleted

## 2012-11-26 ENCOUNTER — Encounter: Payer: Self-pay | Admitting: Internal Medicine

## 2012-11-26 ENCOUNTER — Other Ambulatory Visit: Payer: Self-pay | Admitting: Internal Medicine

## 2012-11-26 DIAGNOSIS — I255 Ischemic cardiomyopathy: Secondary | ICD-10-CM

## 2012-11-26 DIAGNOSIS — Z95810 Presence of automatic (implantable) cardiac defibrillator: Secondary | ICD-10-CM

## 2012-11-26 LAB — ICD DEVICE OBSERVATION
AL IMPEDENCE ICD: 456 Ohm
AL THRESHOLD: 0.75 V
ATRIAL PACING ICD: 5 pct
LV LEAD IMPEDENCE ICD: 646 Ohm
RV LEAD AMPLITUDE: 16.4 mv
RV LEAD THRESHOLD: 1 V

## 2012-11-26 NOTE — Progress Notes (Signed)
Wound check defib in clinic. Normal device function. Wound well healed with no redness or swelling. ROV in 3 mths w/GT.

## 2012-12-01 ENCOUNTER — Telehealth: Payer: Self-pay | Admitting: Internal Medicine

## 2012-12-01 NOTE — Telephone Encounter (Signed)
Please review message. Patient has device question.

## 2012-12-01 NOTE — Telephone Encounter (Signed)
Please call-pt had pacemaker and defibrilator on 11-12-12-Can he take a shower now? Site looks good!

## 2012-12-01 NOTE — Telephone Encounter (Signed)
Spoke to [employer] with [facility] regarding Mr. [name] taking showers. I informed Selena Batten that it was fine for him to take a shower at this point, but it is not recommended for him to take tub baths or to apply lotions or powders to his site as of yet. Kim voiced her understanding and stated that she would relay the message to his daughter.

## 2012-12-21 ENCOUNTER — Ambulatory Visit: Payer: Medicare PPO | Admitting: Physician Assistant

## 2013-01-05 ENCOUNTER — Telehealth: Payer: Self-pay | Admitting: Internal Medicine

## 2013-01-05 NOTE — Telephone Encounter (Signed)
Message forwarded to Dr. Hilty.  

## 2013-01-05 NOTE — Telephone Encounter (Signed)
They need the outstanding orders from 12-11-12-Please fax them asap!  [phone]

## 2013-01-07 ENCOUNTER — Telehealth: Payer: Self-pay | Admitting: Internal Medicine

## 2013-01-07 NOTE — Telephone Encounter (Signed)
Tiffany is calling about orders and plan of care forms that need to be signed by Dr. Rennis Golden .Please give her a cll back   Thanks

## 2013-01-07 NOTE — Telephone Encounter (Signed)
Returned call.  Left message to call back if needed and Dr. Rennis Golden has forms for review.

## 2013-01-11 ENCOUNTER — Other Ambulatory Visit: Payer: Self-pay | Admitting: Oncology

## 2013-01-11 ENCOUNTER — Emergency Department (HOSPITAL_COMMUNITY): Payer: Medicare PPO

## 2013-01-11 ENCOUNTER — Encounter (HOSPITAL_COMMUNITY): Payer: Self-pay | Admitting: Emergency Medicine

## 2013-01-11 ENCOUNTER — Emergency Department (HOSPITAL_COMMUNITY)
Admission: EM | Admit: 2013-01-11 | Discharge: 2013-01-11 | Disposition: A | Discharge status: Home / Self Care | Payer: Medicare PPO | Attending: Emergency Medicine | Admitting: Emergency Medicine

## 2013-01-11 ENCOUNTER — Emergency Department (HOSPITAL_COMMUNITY)
Admission: EM | Admit: 2013-01-11 | Discharge: 2013-01-11 | Disposition: A | Discharge status: Other Acute Inpatient Hospital | Payer: Medicare PPO | Source: Home / Self Care | Attending: Emergency Medicine | Admitting: Emergency Medicine

## 2013-01-11 ENCOUNTER — Encounter (HOSPITAL_COMMUNITY): Payer: Self-pay

## 2013-01-11 DIAGNOSIS — I10 Essential (primary) hypertension: Secondary | ICD-10-CM | POA: Insufficient documentation

## 2013-01-11 DIAGNOSIS — Z9861 Coronary angioplasty status: Secondary | ICD-10-CM | POA: Insufficient documentation

## 2013-01-11 DIAGNOSIS — Z8679 Personal history of other diseases of the circulatory system: Secondary | ICD-10-CM | POA: Insufficient documentation

## 2013-01-11 DIAGNOSIS — I252 Old myocardial infarction: Secondary | ICD-10-CM | POA: Insufficient documentation

## 2013-01-11 DIAGNOSIS — G4730 Sleep apnea, unspecified: Secondary | ICD-10-CM | POA: Insufficient documentation

## 2013-01-11 DIAGNOSIS — Z87891 Personal history of nicotine dependence: Secondary | ICD-10-CM | POA: Insufficient documentation

## 2013-01-11 DIAGNOSIS — R918 Other nonspecific abnormal finding of lung field: Secondary | ICD-10-CM

## 2013-01-11 DIAGNOSIS — R079 Chest pain, unspecified: Secondary | ICD-10-CM

## 2013-01-11 DIAGNOSIS — I447 Left bundle-branch block, unspecified: Secondary | ICD-10-CM | POA: Insufficient documentation

## 2013-01-11 DIAGNOSIS — R222 Localized swelling, mass and lump, trunk: Secondary | ICD-10-CM | POA: Insufficient documentation

## 2013-01-11 DIAGNOSIS — J4489 Other specified chronic obstructive pulmonary disease: Secondary | ICD-10-CM | POA: Insufficient documentation

## 2013-01-11 DIAGNOSIS — I209 Angina pectoris, unspecified: Secondary | ICD-10-CM | POA: Insufficient documentation

## 2013-01-11 DIAGNOSIS — Z95810 Presence of automatic (implantable) cardiac defibrillator: Secondary | ICD-10-CM | POA: Insufficient documentation

## 2013-01-11 DIAGNOSIS — Z4502 Encounter for adjustment and management of automatic implantable cardiac defibrillator: Secondary | ICD-10-CM | POA: Insufficient documentation

## 2013-01-11 DIAGNOSIS — I2510 Atherosclerotic heart disease of native coronary artery without angina pectoris: Secondary | ICD-10-CM | POA: Insufficient documentation

## 2013-01-11 DIAGNOSIS — J9859 Other diseases of mediastinum, not elsewhere classified: Secondary | ICD-10-CM

## 2013-01-11 DIAGNOSIS — J449 Chronic obstructive pulmonary disease, unspecified: Secondary | ICD-10-CM | POA: Insufficient documentation

## 2013-01-11 DIAGNOSIS — Z8673 Personal history of transient ischemic attack (TIA), and cerebral infarction without residual deficits: Secondary | ICD-10-CM | POA: Insufficient documentation

## 2013-01-11 DIAGNOSIS — Z79899 Other long term (current) drug therapy: Secondary | ICD-10-CM | POA: Insufficient documentation

## 2013-01-11 LAB — CBC WITH DIFFERENTIAL/PLATELET
Basophils Absolute: 0 10*3/uL (ref 0.0–0.1)
Basophils Relative: 0 % (ref 0–1)
Eosinophils Relative: 2 % (ref 0–5)
HCT: 30.7 % — ABNORMAL LOW (ref 39.0–52.0)
Lymphocytes Relative: 27 % (ref 12–46)
MCHC: 32.2 g/dL (ref 30.0–36.0)
MCV: 85.8 fL (ref 78.0–100.0)
Monocytes Absolute: 0.5 10*3/uL (ref 0.1–1.0)
Neutro Abs: 2.9 10*3/uL (ref 1.7–7.7)
Platelets: 185 10*3/uL (ref 150–400)
RDW: 13.6 % (ref 11.5–15.5)
WBC: 4.8 10*3/uL (ref 4.0–10.5)

## 2013-01-11 LAB — POCT I-STAT, CHEM 8
Calcium, Ion: 1.18 mmol/L (ref 1.13–1.30)
HCT: 32 % — ABNORMAL LOW (ref 39.0–52.0)
Sodium: 139 mEq/L (ref 135–145)
TCO2: 31 mmol/L (ref 0–100)

## 2013-01-11 LAB — POCT I-STAT TROPONIN I: Troponin i, poc: 0.03 ng/mL (ref 0.00–0.08)

## 2013-01-11 MED ORDER — IOHEXOL 350 MG/ML SOLN
80.0000 mL | Freq: Once | INTRAVENOUS | Status: AC | PRN
  Administered 2013-01-11: 80 mL via INTRAVENOUS

## 2013-01-11 MED ORDER — ASPIRIN 81 MG PO CHEW: 324.0000 mg | CHEWABLE_TABLET | Freq: Once | ORAL | Status: DC

## 2013-01-11 MED ORDER — NITROGLYCERIN 0.4 MG SL SUBL: 0.4000 mg | SUBLINGUAL_TABLET | SUBLINGUAL | Status: DC | PRN

## 2013-01-11 MED ORDER — ASPIRIN 81 MG PO CHEW
324.0000 mg | CHEWABLE_TABLET | Freq: Once | ORAL | Status: AC
  Administered 2013-01-11: 324 mg via ORAL

## 2013-01-11 MED ORDER — ASPIRIN 81 MG PO CHEW
CHEWABLE_TABLET | ORAL | Status: AC
  Filled 2013-01-11: qty 4

## 2013-01-11 MED ORDER — HYDROCODONE-ACETAMINOPHEN 5-325 MG PO TABS: 2.0000 | ORAL_TABLET | ORAL | Status: DC | PRN

## 2013-01-11 NOTE — ED Notes (Signed)
Report to [facility]

## 2013-01-11 NOTE — ED Provider Notes (Signed)
History     CSN: [id number]  Arrival date & time 01/11/13  1106   None     Chief Complaint  Patient presents with  . Chest Pain    (Consider location/radiation/quality/duration/timing/severity/associated sxs/prior treatment) HPI Comments: 72 year old male presents to the urgent care complaining of substernal chest pain that radiates bilaterally for a duration of 3 days. He has also been experiencing dizziness. This has been going on for 3 days and getting consistently worse. He denies any nausea, vomiting, or diaphoresis. He does have a history of MI and stroke. He has recent pacemaker and ICD placement back in April. He also has a history of COPD, hyperlipidemia, hypertension, ischemic cardiomyopathy with heart failure.    Patient is a 72 y.o. male presenting with chest pain.  Chest Pain Associated symptoms: dizziness   Associated symptoms: no abdominal pain, no cough, no diaphoresis, no fatigue, no fever, no nausea, no palpitations, no shortness of breath, not vomiting and no weakness     Past Medical History  Diagnosis Date  . Hypertension   . Ischemic cardiomyopathy Sept 2011    EF improved to 35-40% 2D  . COPD (chronic obstructive pulmonary disease)   . LBBB (left bundle branch block)   . Acute anterior myocardial infarction 2011    s/p PCI to LAD; 2.25 x 12 mm BMS  . ICD (implantable cardiac defibrillator) in place   . Anginal pain   . Sleep apnea     "while sleeping last week" (11/12/2012)  . Stroke 2009    "sometimes left side goes numb/weak/gives out" (11/12/2012)  . Pacemaker     Past Surgical History  Procedure Laterality Date  . Bi-ventricular implantable cardioverter defibrillator  (crt-d)  11/12/2012  . Coronary angioplasty with stent placement  02/12/2010    LAD BMS  . Insert / replace / remove pacemaker      History reviewed. No pertinent family history.  History  Substance Use Topics  . Smoking status: Former Smoker -- 0.50 packs/day for 45 years     Types: Cigarettes    Quit date: 03/13/2009  . Smokeless tobacco: Never Used  . Alcohol Use: No     Comment: 11/12/2012 "last alcohol 2008; never had problem w/it"      Review of Systems  Constitutional: Negative for fever, chills, diaphoresis and fatigue.  Eyes: Negative for visual disturbance.  Respiratory: Negative for cough and shortness of breath.   Cardiovascular: Positive for chest pain. Negative for palpitations and leg swelling.  Gastrointestinal: Negative for nausea, vomiting, abdominal pain, diarrhea and constipation.  Neurological: Positive for dizziness and light-headedness. Negative for weakness.    Allergies  Review of patient's allergies indicates no known allergies.  Home Medications   Current Outpatient Rx  Name  Route  Sig  Dispense  Refill  . carvedilol (COREG) 6.25 MG tablet   Oral   Take 6.25 mg by mouth 2 (two) times daily with a meal.         . furosemide (LASIX) 40 MG tablet   Oral   Take 40 mg by mouth 2 (two) times daily.         . isosorbide dinitrate (ISORDIL) 10 MG tablet   Oral   Take 1 tablet (10 mg total) by mouth every 8 (eight) hours.   90 tablet   5   . omeprazole (PRILOSEC OTC) 20 MG tablet   Oral   Take 20 mg by mouth daily as needed (heart burn).          Marland Kitchen  ramipril (ALTACE) 10 MG capsule   Oral   Take 1 capsule (10 mg total) by mouth daily.   30 capsule   5   . rosuvastatin (CRESTOR) 20 MG tablet   Oral   Take 1 tablet (20 mg total) by mouth daily.   30 tablet   6   . clopidogrel (PLAVIX) 75 MG tablet   Oral   Take 1 tablet (75 mg total) by mouth daily with breakfast.   30 tablet   11   . nitroGLYCERIN (NITROSTAT) 0.4 MG SL tablet   Sublingual   Place 1 tablet (0.4 mg total) under the tongue every 5 (five) minutes x 3 doses as needed for chest pain.   25 tablet   4     BP 91/56  Pulse 65  Temp(Src) 97.6 F (36.4 C) (Oral)  Resp 16  SpO2 100%  Physical Exam  Nursing note and vitals  reviewed. Constitutional: He is oriented to person, place, and time. He appears cachectic. He has a sickly appearance. No distress.  HENT:  Head: Normocephalic and atraumatic.  Eyes: EOM are normal. Pupils are equal, round, and reactive to light.  Cardiovascular: Normal rate and regular rhythm.   No extrasystoles are present. Exam reveals distant heart sounds. Exam reveals no gallop and no friction rub.   No murmur heard. Pulmonary/Chest: Effort normal and breath sounds normal. No respiratory distress. He has no wheezes. He has no rales.  Abdominal: Soft. There is no tenderness.  Neurological: He is oriented to person, place, and time.  Skin: Skin is warm and dry. No rash noted.  Psychiatric: He has a normal mood and affect. Judgment normal.    ED Course  Procedures (including critical care time)  Labs Reviewed - No data to display No results found.   1. Chest pain     EKG obtained which is ST elevation in lead V3 only, otherwise there are some nonspecific changes from the previous EKG done in April.  IV started and patient given 324 mg of chewable aspirin and sublingual nitroglycerin prior to [facility] arrival.  MDM  Due to the patient risk factors, history, and radiating chest pain, patient transferred to the emergency department via [facility] for further workup and management.        Graylon Good, PA-C 01/11/13 1216

## 2013-01-11 NOTE — ED Provider Notes (Signed)
Medical screening examination/treatment/procedure(s) were performed by non-physician practitioner and as supervising physician I was immediately available for consultation/collaboration.  Leslee Home, M.D.  Reuben Likes, MD 01/11/13 2008

## 2013-01-11 NOTE — ED Notes (Signed)
medtronic at bedside for pacemaker check

## 2013-01-11 NOTE — ED Notes (Signed)
2 day duration of chest pain; pacemaker and defibrillator insertion for heart issues; w/d/color

## 2013-01-11 NOTE — ED Provider Notes (Signed)
History     CSN: [id number]  Arrival date & time 01/11/13  1230   First MD Initiated Contact with Patient 01/11/13 1244      Chief Complaint  Patient presents with  . Chest Pain    (Consider location/radiation/quality/duration/timing/severity/associated sxs/prior treatment) HPI History is obtained from the patient, his daughter, the prior medical record. A 72 year old male with a history of ischemic cardiomyopathy, coronary artery disease with an anterior wall myocardial infarction in 2009, PCI to the LAD in 2011, last catheterization in 2013 showing a patent stent. He presents within 2 months of having his biventricular taste or defibrillator placed at this hospital for his stage III CHF. At this time the patient states that he has been having ongoing chest pain in the left side of his chest radiating across to the right side of his chest which is described as 6/10, intermittent, present most days all day long but seems to be worse in the morning. He denies shortness of breath, cough, fever or swelling of the lower extremities and denies any shock like feelings.  The feeling is associated with swelling of the upper mid chest which is gradually worsening   Past Medical History  Diagnosis Date  . Hypertension   . Ischemic cardiomyopathy Sept 2011    EF improved to 35-40% 2D  . COPD (chronic obstructive pulmonary disease)   . LBBB (left bundle branch block)   . Acute anterior myocardial infarction 2011    s/p PCI to LAD; 2.25 x 12 mm BMS  . ICD (implantable cardiac defibrillator) in place   . Anginal pain   . Sleep apnea     "while sleeping last week" (11/12/2012)  . Stroke 2009    "sometimes left side goes numb/weak/gives out" (11/12/2012)  . Pacemaker     Past Surgical History  Procedure Laterality Date  . Bi-ventricular implantable cardioverter defibrillator  (crt-d)  11/12/2012  . Coronary angioplasty with stent placement  02/12/2010    LAD BMS  . Insert / replace / remove  pacemaker      No family history on file.  History  Substance Use Topics  . Smoking status: Former Smoker -- 0.50 packs/day for 45 years    Types: Cigarettes    Quit date: 03/13/2009  . Smokeless tobacco: Never Used  . Alcohol Use: No     Comment: 11/12/2012 "last alcohol 2008; never had problem w/it"      Review of Systems  All other systems reviewed and are negative.    Allergies  Review of patient's allergies indicates no known allergies.  Home Medications   Current Outpatient Rx  Name  Route  Sig  Dispense  Refill  . carvedilol (COREG) 6.25 MG tablet   Oral   Take 6.25 mg by mouth 2 (two) times daily with a meal.         . furosemide (LASIX) 40 MG tablet   Oral   Take 40 mg by mouth 2 (two) times daily.         . isosorbide dinitrate (ISORDIL) 10 MG tablet   Oral   Take 1 tablet (10 mg total) by mouth every 8 (eight) hours.   90 tablet   5   . nitroGLYCERIN (NITROSTAT) 0.4 MG SL tablet   Sublingual   Place 1 tablet (0.4 mg total) under the tongue every 5 (five) minutes x 3 doses as needed for chest pain.   25 tablet   4   . omeprazole (PRILOSEC OTC) 20 MG  tablet   Oral   Take 20 mg by mouth daily as needed (heart burn).          . ramipril (ALTACE) 10 MG capsule   Oral   Take 1 capsule (10 mg total) by mouth daily.   30 capsule   5   . rosuvastatin (CRESTOR) 20 MG tablet   Oral   Take 1 tablet (20 mg total) by mouth daily.   30 tablet   6   . HYDROcodone-acetaminophen (NORCO/VICODIN) 5-325 MG per tablet   Oral   Take 2 tablets by mouth every 4 (four) hours as needed for pain.   10 tablet   0     BP 165/90  Pulse 68  Temp(Src) 97.7 F (36.5 C) (Oral)  Resp 17  SpO2 96%  Physical Exam  Nursing note and vitals reviewed. Constitutional: He appears well-developed and well-nourished. No distress.  HENT:  Head: Normocephalic and atraumatic.  Mouth/Throat: Oropharynx is clear and moist. No oropharyngeal exudate.  Eyes:  Conjunctivae and EOM are normal. Pupils are equal, round, and reactive to light. Right eye exhibits no discharge. Left eye exhibits no discharge. No scleral icterus.  Neck: Normal range of motion. Neck supple. No JVD present. No thyromegaly present.  Cardiovascular: Normal rate, regular rhythm and intact distal pulses.  Exam reveals no gallop and no friction rub.   No murmur heard. Distant heart sounds, normal pulses at the radial arteries bilaterally  Pulmonary/Chest: Effort normal and breath sounds normal. No respiratory distress. He has no wheezes. He has no rales. He exhibits no tenderness.  No chest wall tenderness, no redness or swelling over the pacemaker site  Abdominal: Soft. Bowel sounds are normal. He exhibits no distension and no mass. There is no tenderness.  Musculoskeletal: Normal range of motion. He exhibits no edema and no tenderness.  Lymphadenopathy:    He has no cervical adenopathy.  Neurological: He is alert. Coordination normal.  Skin: Skin is warm and dry. No rash noted. No erythema.  Psychiatric: He has a normal mood and affect. His behavior is normal.     ED Course  Procedures (including critical care time)  Labs Reviewed  CBC WITH DIFFERENTIAL - Abnormal; Notable for the following:    RBC 3.58 (*)    Hemoglobin 9.9 (*)    HCT 30.7 (*)    All other components within normal limits  POCT I-STAT, CHEM 8 - Abnormal; Notable for the following:    BUN 51 (*)    Creatinine, Ser 2.10 (*)    Hemoglobin 10.9 (*)    HCT 32.0 (*)    All other components within normal limits  POCT I-STAT TROPONIN I   Dg Chest Port 1 View  01/11/2013   *RADIOLOGY REPORT*  Clinical Data: Chest pain with shortness of breath for 2 months.  PORTABLE CHEST - 1 VIEW  Comparison: 11/13/2012 and 10/25/2012.  Findings: 1306 hours.  The left subclavian pacemaker leads appear unchanged.  The heart size and mediastinal contours are stable. There is aorto iliac atherosclerosis.  The lungs are clear  aside from a stable calcified right apical granuloma.  IMPRESSION: Stable examination.  No acute cardiopulmonary process.   Original Report Authenticated By: Carey Bullocks, M.D.   Ct Angio Chest Aorta W/cm &/or Wo/cm  01/11/2013   *RADIOLOGY REPORT*  Clinical Data:  Chest pain, widened mediastinum  CT ANGIOGRAPHY CHEST, ABDOMEN AND PELVIS  Technique:  Multidetector CT imaging through the chest, abdomen and pelvis was performed using the standard  protocol during bolus administration of intravenous contrast.  Multiplanar reconstructed images including MIPs were obtained and reviewed to evaluate the vascular anatomy.  Contrast: 80mL OMNIPAQUE IOHEXOL 350 MG/ML SOLN  Comparison:   None.  CTA CHEST  Findings:  Negative for aneurysm, dissection, or stenosis. Scattered atheromatous plaque in the aortic arch and descending segment.  Patchy coronary calcifications.  Pulmonary arterial circulation unremarkable.  No evidence of filling defect to suggest acute PE.  Classic three-vessel brachiocephalic arterial origin anatomy without proximal stenosis. Left subclavian Transvenous pacemaker.   There is narrowing of the left innominate vein which appears occluded near its confluence with the SVC.  Multiple thyrocervical collaterals provide venous drainage of the left upper extremity.  There is lobular 2x4 cm anterior mediastinal mass or adenopathy with contiguous extension between the SVC and right brachiocephalic artery to the right paratracheal chain. There is a permeative cortical destruction in the right manubrium   contiguous with this lesion.  There is bilateral hilar adenopathy, measuring up to 17 mm short axis diameter right, 14 mm left.  Prominent poorly marginated subcarinal nodes measuring up to 14 mm short axis diameter.  No pleural or pericardial effusion.  No mediastinal hematoma.  There is a 2.6 cm spiculated opacity at the left lung apex.  There is a more smoothly marginated 8 x 15 mm nodule in the posterior  basal segment left lower lobe.  Minimal linear scarring or atelectasis in both lung bases.   Review of the MIP images confirms the above findings.  IMPRESSION: 1.  Negative for thoracic aortic dissection or pulmonary emboli. 2. 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 There is also other mediastinal and bilateral hilar adenopathy.  Considerations include lymphoma, thymoma, metastatic disease. Accessible for percutaneous biopsy if needed. 3.  Left apical and left lower lobe pulmonary lesions of uncertain etiology, possibly primary or secondary neoplasm.  PET-CT may be useful for further characterization. 4. Atherosclerosis, including aortic and coronary artery disease. Please note that although the presence of coronary artery calcium documents the presence of coronary artery disease, the severity of this disease and any potential stenosis cannot be assessed on this non-gated CT examination.  Assessment for potential risk factor modification, dietary therapy or pharmacologic therapy may be warranted, if clinically indicated.  CTA ABDOMEN AND PELVIS  Arterial findings: Aorta:                  Suprarenal segment is atheromatous and ectatic up to 3.1 cm diameter.  3.8 x 3.9 cm fusiform somewhat tortuous aneurysm of the infrarenal segment, tapering to a diameter of 2 cm at the bifurcation.  Celiac axis:            Patent, unremarkable distal branching  Superior mesenteric:Mild proximal plaque but no significant stenosis, unremarkable distal branching anatomy  Left renal:             Duplicated, inferior dominant, both widely patent  Right renal:            Single, with nonocclusive proximal plaque, patent distally  Inferior mesenteric:Patent, arising from the aneurysmal segment.  Left iliac:             Mildly tortuous without aneurysm or dissection.  Right iliac:            Tortuous and atheromatous without aneurysm or dissection.  Venous findings:   Dedicated venous phase imaging was not obtained.  Review of the MIP images confirms the above findings.  Nonvascular findings: Unremarkable arterial phase evaluation of liver, nondilated gallbladder, spleen, adrenal glands.  14 mm probable cyst from the lower pole left kidney.  10 mm possible cyst in the interpolar region right kidney.  No hydronephrosis. Unremarkable pancreas.  Stomach, small bowel, and colon are nondilated.  No ascites.  No free air. No adenopathy localized. Urinary bladder is distended.  There is moderate prostatic enlargement.  Spondylitic changes in the lower lumbar spine.  No lytic or sclerotic osseous lesion is identified.  IMPRESSION:  1.  No acute abdominal process. 2.  3.9 cm fusiform infrarenal aortic aneurysm. 3.  No evidence of metastatic disease.   Original Report Authenticated By: D. Andria Rhein, MD   Ct Cta Abd/pel W/cm &/or W/o Cm  01/11/2013   *RADIOLOGY REPORT*  Clinical Data:  Chest pain, widened mediastinum  CT ANGIOGRAPHY CHEST, ABDOMEN AND PELVIS  Technique:  Multidetector CT imaging through the chest, abdomen and pelvis was performed using the standard protocol during bolus administration of intravenous contrast.  Multiplanar reconstructed images including MIPs were obtained and reviewed to evaluate the vascular anatomy.  Contrast: 80mL OMNIPAQUE IOHEXOL 350 MG/ML SOLN  Comparison:   None.  CTA CHEST  Findings:  Negative for aneurysm, dissection, or stenosis. Scattered atheromatous plaque in the aortic arch and descending segment.  Patchy coronary calcifications.  Pulmonary arterial circulation unremarkable.  No evidence of filling defect to suggest acute PE.  Classic three-vessel brachiocephalic arterial origin anatomy without proximal stenosis. Left subclavian Transvenous pacemaker.   There is narrowing of the left innominate vein which appears occluded near its confluence with the SVC.  Multiple thyrocervical collaterals provide venous drainage of the left upper  extremity.  There is lobular 2x4 cm anterior mediastinal mass or adenopathy with contiguous extension between the SVC and right brachiocephalic artery to the right paratracheal chain. There is a permeative cortical destruction in the right manubrium   contiguous with this lesion.  There is bilateral hilar adenopathy, measuring up to 17 mm short axis diameter right, 14 mm left.  Prominent poorly marginated subcarinal nodes measuring up to 14 mm short axis diameter.  No pleural or pericardial effusion.  No mediastinal hematoma.  There is a 2.6 cm spiculated opacity at the left lung apex.  There is a more smoothly marginated 8 x 15 mm nodule in the posterior basal segment left lower lobe.  Minimal linear scarring or atelectasis in both lung bases.   Review of the MIP images confirms the above findings.  IMPRESSION: 1.  Negative for thoracic aortic dissection or pulmonary emboli. 2. 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 There is also other mediastinal and bilateral hilar adenopathy.  Considerations include lymphoma, thymoma, metastatic disease. Accessible for percutaneous biopsy if needed. 3.  Left apical and left lower lobe pulmonary lesions of uncertain etiology, possibly primary or secondary neoplasm.  PET-CT may be useful for further characterization. 4. Atherosclerosis, including aortic and coronary artery disease. Please note that although the presence of coronary artery calcium documents the presence of coronary artery disease, the severity of this disease and any potential stenosis cannot be assessed on this non-gated CT examination.  Assessment for potential risk factor modification, dietary therapy or pharmacologic therapy may be warranted, if clinically indicated.  CTA ABDOMEN AND PELVIS  Arterial findings: Aorta:                  Suprarenal segment is atheromatous and ectatic up to 3.1 cm diameter.  3.8 x  3.9 cm fusiform somewhat  tortuous aneurysm of the infrarenal segment, tapering to a diameter of 2 cm at the bifurcation.  Celiac axis:            Patent, unremarkable distal branching  Superior mesenteric:Mild proximal plaque but no significant stenosis, unremarkable distal branching anatomy  Left renal:             Duplicated, inferior dominant, both widely patent  Right renal:            Single, with nonocclusive proximal plaque, patent distally  Inferior mesenteric:Patent, arising from the aneurysmal segment.  Left iliac:             Mildly tortuous without aneurysm or dissection.  Right iliac:            Tortuous and atheromatous without aneurysm or dissection.  Venous findings:  Dedicated venous phase imaging was not obtained.  Review of the MIP images confirms the above findings.  Nonvascular findings: Unremarkable arterial phase evaluation of liver, nondilated gallbladder, spleen, adrenal glands.  14 mm probable cyst from the lower pole left kidney.  10 mm possible cyst in the interpolar region right kidney.  No hydronephrosis. Unremarkable pancreas.  Stomach, small bowel, and colon are nondilated.  No ascites.  No free air. No adenopathy localized. Urinary bladder is distended.  There is moderate prostatic enlargement.  Spondylitic changes in the lower lumbar spine.  No lytic or sclerotic osseous lesion is identified.  IMPRESSION:  1.  No acute abdominal process. 2.  3.9 cm fusiform infrarenal aortic aneurysm. 3.  No evidence of metastatic disease.   Original Report Authenticated By: D. Andria Rhein, MD     1. Chest pain   2. Mediastinal mass   3. Lung mass       MDM  the patient has severe heart disease, ongoing rather consistent chest pain every day all day long which in his case is unlikely to be related to ischemic myocardium as his last cath last year showed no significant obstructions and his EKG here is a paced rhythm with no signs of acute ischemia. He will be further evaluation with laboratory workup, chest x-ray  to rule out a small pneumothorax, troponin, interrogate defibrillator / pacer  ED ECG REPORT  I personally interpreted this EKG   Date: 01/11/2013   Rate: 60  Rhythm: paced rhythm  QRS Axis: normal  Intervals: normal  ST/T Wave abnormalities: nonspecific ST/T changes  Conduction Disutrbances:none  Narrative Interpretation:   Old EKG Reviewed: since last tracing, no sig changes  Though there is no signs of dissection or PE on the CT scan there is evidence of a mediastinal mass as well as a couple of pulmonary spiculated nodules which could be cancerous in nature.  I have informed the pt of these results.  The mediastinal mass is infiltrating the manubrium posteriorly and is the likely source of the pt's pain.  Oncology consulted for follow up.  He is HD stable with normal labs including troponin, though his creatinine is slightly worse and his Hgb has dropped 1.5gm in two months.  Family states that he has been losing some weight.  D/w Dr. Gaylyn Rong - will see pt after he has been seen by [facility] for biopsy.  D/w Dr. Tyrone Sage - will see in office on Thursday.  Vida Roller, MD 01/11/13 [phone]

## 2013-01-11 NOTE — ED Notes (Signed)
Had pacemaker / deb placed in April and he has had cp since worse in the last 3 days went to [facility] today for tx and was sent to er for further ttests

## 2013-01-12 ENCOUNTER — Telehealth: Payer: Self-pay | Admitting: *Deleted

## 2013-01-12 NOTE — Telephone Encounter (Signed)
Spoke with daughter regarding appt for [facility] 01/14/13 at 2:00 arrive at 1:45.  She verbalized understanding of time and place of appt

## 2013-01-13 ENCOUNTER — Telehealth: Payer: Self-pay | Admitting: Internal Medicine

## 2013-01-13 NOTE — Telephone Encounter (Signed)
C/D 01/13/13 for appt. 01/14/13 °

## 2013-01-14 ENCOUNTER — Ambulatory Visit (HOSPITAL_BASED_OUTPATIENT_CLINIC_OR_DEPARTMENT_OTHER): Payer: Medicare PPO | Admitting: Internal Medicine

## 2013-01-14 ENCOUNTER — Encounter: Payer: Self-pay | Admitting: Radiation Oncology

## 2013-01-14 ENCOUNTER — Encounter: Payer: Self-pay | Admitting: Surgery

## 2013-01-14 ENCOUNTER — Ambulatory Visit
Admission: RE | Admit: 2013-01-14 | Discharge: 2013-01-14 | Disposition: A | Discharge status: Home / Self Care | Payer: Medicare PPO | Source: Ambulatory Visit | Attending: Radiation Oncology | Admitting: Radiation Oncology

## 2013-01-14 ENCOUNTER — Other Ambulatory Visit: Payer: Self-pay | Admitting: *Deleted

## 2013-01-14 ENCOUNTER — Institutional Professional Consult (permissible substitution) (INDEPENDENT_AMBULATORY_CARE_PROVIDER_SITE_OTHER): Payer: Medicare PPO | Admitting: Surgery

## 2013-01-14 VITALS — BP 95/58 | HR 81 | Temp 97.4°F | Resp 18 | Ht 67.0 in | Wt 107.4 lb

## 2013-01-14 DIAGNOSIS — R918 Other nonspecific abnormal finding of lung field: Secondary | ICD-10-CM

## 2013-01-14 DIAGNOSIS — J9859 Other diseases of mediastinum, not elsewhere classified: Secondary | ICD-10-CM

## 2013-01-14 DIAGNOSIS — C771 Secondary and unspecified malignant neoplasm of intrathoracic lymph nodes: Secondary | ICD-10-CM

## 2013-01-14 NOTE — Progress Notes (Signed)
Radiation Oncology         [phone] ________________________________   [facility] Initial outpatient Consultation  Name: [name]  MRN: [id number]  Date: 01/14/2013  DOB: 19-Sep-1940  WU:JWJXB,JYNWGNFA J, MD  Vida Roller, MD   REFERRING PHYSICIAN: Vida Roller, MD  DIAGNOSIS: 72 year old gentleman presenting with an Anterior Mediastinal Adenopathy with Sternal Invasion, pending biopsy.  HISTORY OF PRESENT ILLNESS::[name] is a 72 y.o. male who is has a history of ischemic cardiomyopathy, coronary artery disease who presented to the ED on 6/23 with chest pain in the left side of his chest radiating across to the right side of his chest which was described as 6/10, intermittent, present most days all day long but seemed to be worse in the morning. He denied shortness of breath, cough, fever or swelling of the lower extremities and denies any shock like feelings. The feeling is associated with swelling of the upper mid chest which is gradually worsening. CT showed an anterior mediastinal mass with contiguous mediastinal adenopathy and adjacent permeative destruction of manubrium. There was also other mediastinal and bilateral hilar adenopathy.  Left apical and left lower lobe pulmonary lesions were also seen.  He presents for consultation today in the multidisciplinary thoracic oncology clinic.  PREVIOUS RADIATION THERAPY: No  PAST MEDICAL HISTORY:  has a past medical history of Hypertension; Ischemic cardiomyopathy (Sept 2011); COPD (chronic obstructive pulmonary disease); LBBB (left bundle branch block); Acute anterior myocardial infarction (2011); ICD (implantable cardiac defibrillator) in place; Anginal pain; Sleep apnea; Stroke (2009); and Pacemaker.    PAST SURGICAL HISTORY: Past Surgical History  Procedure Laterality Date  . Bi-ventricular implantable cardioverter defibrillator  (crt-d)  11/12/2012  . Coronary angioplasty with stent placement  02/12/2010    LAD BMS  . Insert /  replace / remove pacemaker      FAMILY HISTORY: family history is not on file.  SOCIAL HISTORY:  reports that he quit smoking about 3 years ago. His smoking use included Cigarettes. He has a 22.5 pack-year smoking history. He has never used smokeless tobacco. He reports that he does not drink alcohol or use illicit drugs.  ALLERGIES: Review of patient's allergies indicates no known allergies.  MEDICATIONS:  Current Outpatient Prescriptions  Medication Sig Dispense Refill  . carvedilol (COREG) 6.25 MG tablet Take 6.25 mg by mouth 2 (two) times daily with a meal.      . furosemide (LASIX) 40 MG tablet Take 40 mg by mouth 2 (two) times daily.      Marland Kitchen HYDROcodone-acetaminophen (NORCO/VICODIN) 5-325 MG per tablet Take 2 tablets by mouth every 4 (four) hours as needed for pain.  10 tablet  0  . isosorbide dinitrate (ISORDIL) 10 MG tablet Take 1 tablet (10 mg total) by mouth every 8 (eight) hours.  90 tablet  5  . nitroGLYCERIN (NITROSTAT) 0.4 MG SL tablet Place 1 tablet (0.4 mg total) under the tongue every 5 (five) minutes x 3 doses as needed for chest pain.  25 tablet  4  . omeprazole (PRILOSEC OTC) 20 MG tablet Take 20 mg by mouth daily as needed (heart burn).       . ramipril (ALTACE) 10 MG capsule Take 1 capsule (10 mg total) by mouth daily.  30 capsule  5  . rosuvastatin (CRESTOR) 20 MG tablet Take 1 tablet (20 mg total) by mouth daily.  30 tablet  6   No current facility-administered medications for this encounter.    REVIEW OF SYSTEMS:  A 15 point  review of systems is documented in the electronic medical record. This was obtained by the nursing staff. However, I reviewed this with the patient to discuss relevant findings and make appropriate changes.  Pertinent items are noted in HPI.   PHYSICAL EXAM:  The patient is a thin gentleman in no acute distress today. He is alert and oriented. He speaks [place] and there is a Nurse, learning disability available for today's visit. Patient's head is  normocephalic and atraumatic. Extraocular muscles are intact. Respiratory effort is unremarkable. Extremities are free of cyanosis clubbing or edema. Musculoskeletal exam reveals no areas of focal bony tenderness. The patient does localized pain to the upper sternum   LABORATORY DATA:  Lab Results  Component Value Date   WBC 4.8 01/11/2013   HGB 10.9* 01/11/2013   HCT 32.0* 01/11/2013   MCV 85.8 01/11/2013   PLT 185 01/11/2013   Lab Results  Component Value Date   NA 139 01/11/2013   K 4.4 01/11/2013   CL 98 01/11/2013   CO2 29 11/01/2012   Lab Results  Component Value Date   ALT 91* 10/30/2012   AST 88* 10/30/2012   ALKPHOS 81 10/30/2012   BILITOT 0.5 10/30/2012     RADIOGRAPHY: Dg Chest Port 1 View  01/11/2013   *RADIOLOGY REPORT*  Clinical Data: Chest pain with shortness of breath for 2 months.  PORTABLE CHEST - 1 VIEW  Comparison: 11/13/2012 and 10/25/2012.  Findings: 1306 hours.  The left subclavian pacemaker leads appear unchanged.  The heart size and mediastinal contours are stable. There is aorto iliac atherosclerosis.  The lungs are clear aside from a stable calcified right apical granuloma.  IMPRESSION: Stable examination.  No acute cardiopulmonary process.   Original Report Authenticated By: Carey Bullocks, M.D.   Ct Angio Chest Aorta W/cm &/or Wo/cm  01/11/2013   *RADIOLOGY REPORT*  Clinical Data:  Chest pain, widened mediastinum  CT ANGIOGRAPHY CHEST, ABDOMEN AND PELVIS  Technique:  Multidetector CT imaging through the chest, abdomen and pelvis was performed using the standard protocol during bolus administration of intravenous contrast.  Multiplanar reconstructed images including MIPs were obtained and reviewed to evaluate the vascular anatomy.  Contrast: 80mL OMNIPAQUE IOHEXOL 350 MG/ML SOLN  Comparison:   None.  CTA CHEST  Findings:  Negative for aneurysm, dissection, or stenosis. Scattered atheromatous plaque in the aortic arch and descending segment.  Patchy coronary  calcifications.  Pulmonary arterial circulation unremarkable.  No evidence of filling defect to suggest acute PE.  Classic three-vessel brachiocephalic arterial origin anatomy without proximal stenosis. Left subclavian Transvenous pacemaker.   There is narrowing of the left innominate vein which appears occluded near its confluence with the SVC.  Multiple thyrocervical collaterals provide venous drainage of the left upper extremity.  There is lobular 2x4 cm anterior mediastinal mass or adenopathy with contiguous extension between the SVC and right brachiocephalic artery to the right paratracheal chain. There is a permeative cortical destruction in the right manubrium   contiguous with this lesion.  There is bilateral hilar adenopathy, measuring up to 17 mm short axis diameter right, 14 mm left.  Prominent poorly marginated subcarinal nodes measuring up to 14 mm short axis diameter.  No pleural or pericardial effusion.  No mediastinal hematoma.  There is a 2.6 cm spiculated opacity at the left lung apex.  There is a more smoothly marginated 8 x 15 mm nodule in the posterior basal segment left lower lobe.  Minimal linear scarring or atelectasis in both lung bases.   Review  of the MIP images confirms the above findings.  IMPRESSION: 1.  Negative for thoracic aortic dissection or pulmonary emboli. 2. 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 There is also other mediastinal and bilateral hilar adenopathy.  Considerations include lymphoma, thymoma, metastatic disease. Accessible for percutaneous biopsy if needed. 3.  Left apical and left lower lobe pulmonary lesions of uncertain etiology, possibly primary or secondary neoplasm.  PET-CT may be useful for further characterization. 4. Atherosclerosis, including aortic and coronary artery disease. Please note that although the presence of coronary artery calcium documents the presence of coronary artery  disease, the severity of this disease and any potential stenosis cannot be assessed on this non-gated CT examination.  Assessment for potential risk factor modification, dietary therapy or pharmacologic therapy may be warranted, if clinically indicated.  CTA ABDOMEN AND PELVIS  Arterial findings: Aorta:                  Suprarenal segment is atheromatous and ectatic up to 3.1 cm diameter.  3.8 x 3.9 cm fusiform somewhat tortuous aneurysm of the infrarenal segment, tapering to a diameter of 2 cm at the bifurcation.  Celiac axis:            Patent, unremarkable distal branching  Superior mesenteric:Mild proximal plaque but no significant stenosis, unremarkable distal branching anatomy  Left renal:             Duplicated, inferior dominant, both widely patent  Right renal:            Single, with nonocclusive proximal plaque, patent distally  Inferior mesenteric:Patent, arising from the aneurysmal segment.  Left iliac:             Mildly tortuous without aneurysm or dissection.  Right iliac:            Tortuous and atheromatous without aneurysm or dissection.  Venous findings:  Dedicated venous phase imaging was not obtained.  Review of the MIP images confirms the above findings.  Nonvascular findings: Unremarkable arterial phase evaluation of liver, nondilated gallbladder, spleen, adrenal glands.  14 mm probable cyst from the lower pole left kidney.  10 mm possible cyst in the interpolar region right kidney.  No hydronephrosis. Unremarkable pancreas.  Stomach, small bowel, and colon are nondilated.  No ascites.  No free air. No adenopathy localized. Urinary bladder is distended.  There is moderate prostatic enlargement.  Spondylitic changes in the lower lumbar spine.  No lytic or sclerotic osseous lesion is identified.  IMPRESSION:  1.  No acute abdominal process. 2.  3.9 cm fusiform infrarenal aortic aneurysm. 3.  No evidence of metastatic disease.   Original Report Authenticated By: D. Andria Rhein, MD   Ct Cta  Abd/pel W/cm &/or W/o Cm  01/11/2013   *RADIOLOGY REPORT*  Clinical Data:  Chest pain, widened mediastinum  CT ANGIOGRAPHY CHEST, ABDOMEN AND PELVIS  Technique:  Multidetector CT imaging through the chest, abdomen and pelvis was performed using the standard protocol during bolus administration of intravenous contrast.  Multiplanar reconstructed images including MIPs were obtained and reviewed to evaluate the vascular anatomy.  Contrast: 80mL OMNIPAQUE IOHEXOL 350 MG/ML SOLN  Comparison:   None.  CTA CHEST  Findings:  Negative for aneurysm, dissection, or stenosis. Scattered atheromatous plaque in the aortic arch and descending segment.  Patchy coronary calcifications.  Pulmonary arterial circulation unremarkable.  No evidence of filling defect to suggest acute PE.  Classic three-vessel brachiocephalic arterial origin anatomy without proximal  stenosis. Left subclavian Transvenous pacemaker.   There is narrowing of the left innominate vein which appears occluded near its confluence with the SVC.  Multiple thyrocervical collaterals provide venous drainage of the left upper extremity.  There is lobular 2x4 cm anterior mediastinal mass or adenopathy with contiguous extension between the SVC and right brachiocephalic artery to the right paratracheal chain. There is a permeative cortical destruction in the right manubrium   contiguous with this lesion.  There is bilateral hilar adenopathy, measuring up to 17 mm short axis diameter right, 14 mm left.  Prominent poorly marginated subcarinal nodes measuring up to 14 mm short axis diameter.  No pleural or pericardial effusion.  No mediastinal hematoma.  There is a 2.6 cm spiculated opacity at the left lung apex.  There is a more smoothly marginated 8 x 15 mm nodule in the posterior basal segment left lower lobe.  Minimal linear scarring or atelectasis in both lung bases.   Review of the MIP images confirms the above findings.  IMPRESSION: 1.  Negative for thoracic aortic  dissection or pulmonary emboli. 2. 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 There is also other mediastinal and bilateral hilar adenopathy.  Considerations include lymphoma, thymoma, metastatic disease. Accessible for percutaneous biopsy if needed. 3.  Left apical and left lower lobe pulmonary lesions of uncertain etiology, possibly primary or secondary neoplasm.  PET-CT may be useful for further characterization. 4. Atherosclerosis, including aortic and coronary artery disease. Please note that although the presence of coronary artery calcium documents the presence of coronary artery disease, the severity of this disease and any potential stenosis cannot be assessed on this non-gated CT examination.  Assessment for potential risk factor modification, dietary therapy or pharmacologic therapy may be warranted, if clinically indicated.  CTA ABDOMEN AND PELVIS  Arterial findings: Aorta:                  Suprarenal segment is atheromatous and ectatic up to 3.1 cm diameter.  3.8 x 3.9 cm fusiform somewhat tortuous aneurysm of the infrarenal segment, tapering to a diameter of 2 cm at the bifurcation.  Celiac axis:            Patent, unremarkable distal branching  Superior mesenteric:Mild proximal plaque but no significant stenosis, unremarkable distal branching anatomy  Left renal:             Duplicated, inferior dominant, both widely patent  Right renal:            Single, with nonocclusive proximal plaque, patent distally  Inferior mesenteric:Patent, arising from the aneurysmal segment.  Left iliac:             Mildly tortuous without aneurysm or dissection.  Right iliac:            Tortuous and atheromatous without aneurysm or dissection.  Venous findings:  Dedicated venous phase imaging was not obtained.  Review of the MIP images confirms the above findings.  Nonvascular findings: Unremarkable arterial phase evaluation of liver, nondilated  gallbladder, spleen, adrenal glands.  14 mm probable cyst from the lower pole left kidney.  10 mm possible cyst in the interpolar region right kidney.  No hydronephrosis. Unremarkable pancreas.  Stomach, small bowel, and colon are nondilated.  No ascites.  No free air. No adenopathy localized. Urinary bladder is distended.  There is moderate prostatic enlargement.  Spondylitic changes in the lower lumbar spine.  No lytic or sclerotic osseous lesion is identified.  IMPRESSION:  1.  No acute abdominal process. 2.  3.9 cm fusiform infrarenal aortic aneurysm. 3.  No evidence of metastatic disease.   Original Report Authenticated By: D. 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 However, tissue diagnosis has not been obtained. Certainly, there are a variety of potential treatment recommendations covered by the results of biopsy. In many of these scenarios, palliative radiation would likely be warranted to the manubrial mass given that pain related to this deposit was his presenting symptom.   PLAN:  The patient will be set up to undergo biopsy in the next few days along with PET scan. Brain MRI may be warranted if this is a primary lung cancer. I will look forward to reviewing the results of the upcoming studies and potentially offering radiotherapy if clinically warranted. I did take our opportunity today to spent time talking with the patient and his family about the use of radiotherapy in the palliative setting for pain relief for metastatic malignancy. We talked about the logistics and delivery of 10 fractions of radiation delivered to the upper chest. We also discussed the potential side effects associated with radiation and the short-term and long-term. Our discussion was intended to introduce the concept of radiation treatment. I  will look forward to following up with the patient in the event that radiation is warranted to provide greater detail and formulate a treatment plan.   Of note, the patient did have a biventricular AICD device placed a few months ago in the left upper chest somewhat and your his potential radiation treatment site. I will provide cardiology with an estimated radiation dose in order to get recommendations regarding surveillance and care of the implanted device during and after a possible course of radiation treatment.  I spent 30 minutes minutes face to face with the patient and more than 50% of that time was spent in counseling and/or coordination of care.   ------------------------------------------------  Artist Pais. Kathrynn Running, M.D.

## 2013-01-14 NOTE — Progress Notes (Signed)
The patient is seen by surgery only. I will wait until he has PET scan and biopsy done before having a formal consult with him.

## 2013-01-14 NOTE — Progress Notes (Addendum)
  Radiation Oncology         [phone] ________________________________  Name: [name]  MRN: [id number]  Date: 01/14/2013  DOB: 10/21/1940  Chart Note:  Mr. [name] is being worked up for likely malignancy with painful manubrial involvement.  After PET and biopsy, it is very likely that he will benefit from palliative radiation to the sternal mass and underlying anterior mediastinal adenopathy.  He had a [employer] Viva XTCRTD biventricular ICD serial number, [id number] placed a few months ago within 3.5 cm of his planned radiation treatment area.  Preliminary model of radiation field with 2 cm margin around the patient manubrial and mediastinal disease based on existing diagnostic CT shown below (target red, AICD magenta):    With a palliative radiation dose level to relieve pain at 30 Gy, despite the proximity to the device, the anticipated dose to the AICD in this setting should be less than 2 Gy.  In a proposed risk stratification from [facility], et al., patients receiving less than 2 Gy would fall into the Low risk group if they are pacer independent and an Intermediate Risk group if considered pacer dependent.  The guidelines for each of these groups is shown below.     Full Text Link:  [email or web address]  We will coordinate care with cardiology.  Given the timing of PET scan and biopsy, radiation therapy is unlikely to begin before July 7th and will include a total of 10 treatments, Mon-Fri, over 2 weeks.   ________________________________  Artist Pais Kathrynn Running, M.D.

## 2013-01-14 NOTE — Progress Notes (Signed)
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 He has a history of ischemic cardiomyopathy with an ejection fraction of 25% and chronic systolic congestive heart failure. He was admitted in April 2014 with an exacerbation of his heart failure and subsequently underwent implantation of a [employer] CRTD by Dr. Ladona Ridgel. At the time of admission he was having pain across the upper chest that is present almost all the time. It is not related to exertion. He had a CT scan the chest on 01/11/2013 which showed an anterior mediastinal soft tissue mass or adenopathy with contiguous extension between the superior vena cava and right brachiocephalic artery to the right paratracheal region. There was cortical destruction in the right manubrium contiguous with the lesion. There is also bilateral hilar adenopathy and a 2.6 cm spiculated opacity in the left lung apex. There is a more smoothly marginated 8 x 15 mm nodule in the posterior basal segment of left lower lobe.  Past Medical History  Diagnosis Date  . Hypertension   . Ischemic cardiomyopathy Sept 2011    EF improved to 35-40% 2D  . COPD (chronic obstructive pulmonary disease)   . LBBB (left bundle branch block)   . Acute anterior myocardial infarction 2011    s/p PCI to LAD; 2.25 x 12 mm BMS  . ICD (implantable cardiac defibrillator) in place   . Anginal pain   . Sleep apnea     "while sleeping last week" (11/12/2012)  . Stroke 2009    "sometimes left side goes numb/weak/gives out" (11/12/2012)  . Pacemaker     Past Surgical History  Procedure Laterality Date  . Bi-ventricular implantable  cardioverter defibrillator  (crt-d)  11/12/2012  . Coronary angioplasty with stent placement  02/12/2010    LAD BMS  . Insert / replace / remove pacemaker      History reviewed. No pertinent family history.  Social History History  Substance Use Topics  . Smoking status: Former Smoker -- 0.50 packs/day for 45 years    Types: Cigarettes    Quit date: 03/13/2009  . Smokeless tobacco: Never Used  . Alcohol Use: No     Comment: 11/12/2012 "last alcohol 2008; never had problem w/it"    Current Outpatient Prescriptions  Medication Sig Dispense Refill  . carvedilol (COREG) 6.25 MG tablet Take 6.25 mg by mouth 2 (two) times daily with a meal.      . furosemide (LASIX) 40 MG tablet Take 40 mg by mouth 2 (two) times daily.      Marland Kitchen HYDROcodone-acetaminophen (NORCO/VICODIN) 5-325 MG per tablet Take 2 tablets by mouth every 4 (four) hours as needed for pain.  10 tablet  0  . isosorbide dinitrate (ISORDIL) 10 MG tablet Take 1 tablet (10 mg total) by mouth every 8 (eight) hours.  90 tablet  5  . nitroGLYCERIN (NITROSTAT) 0.4 MG SL tablet Place 1 tablet (0.4 mg total) under the tongue every 5 (five) minutes x 3 doses as needed for chest pain.  25 tablet  4  . omeprazole (PRILOSEC OTC) 20 MG tablet Take 20 mg by mouth daily as needed (heart burn).       Marland Kitchen  ramipril (ALTACE) 10 MG capsule Take 1 capsule (10 mg total) by mouth daily.  30 capsule  5  . rosuvastatin (CRESTOR) 20 MG tablet Take 1 tablet (20 mg total) by mouth daily.  30 tablet  6   No current facility-administered medications for this visit.    No Known Allergies  Review of Systems  Constitutional: Positive for appetite change, fatigue and unexpected weight change. Negative for fever and chills.       Poor appetite and approximately 10 lb wt loss.  HENT: Negative.   Eyes: Negative.   Respiratory: Positive for cough and chest tightness. Negative for shortness of breath.   Cardiovascular: Positive for chest pain. Negative for  palpitations and leg swelling.  Gastrointestinal: Negative.   Endocrine: Negative.   Genitourinary: Negative.   Musculoskeletal: Positive for back pain.       Pain over upper anterior chest wall  Skin: Negative.   Allergic/Immunologic: Negative.   Neurological: Negative.   Hematological: Negative.   Psychiatric/Behavioral: Negative.     There were no vitals taken for this visit. Physical Exam  Constitutional: He is oriented to person, place, and time.  Cachectic asian gentleman who looks chronically ill and uncomfortable.  HENT:  Head: Normocephalic and atraumatic.  Mouth/Throat: Oropharynx is clear and moist.  Eyes: Conjunctivae and EOM are normal. Pupils are equal, round, and reactive to light.  Neck: Normal range of motion. Neck supple. No JVD present. No tracheal deviation present. No thyromegaly present.  No supraclavicular adenopathy  Cardiovascular: Normal rate, regular rhythm, normal heart sounds and intact distal pulses.  Exam reveals no gallop and no friction rub.   No murmur heard. Pulmonary/Chest: Effort normal and breath sounds normal. No respiratory distress. He has no wheezes. He has no rales. He exhibits tenderness.  Tender over manubrium.  Abdominal: Soft. Bowel sounds are normal. He exhibits no distension and no mass. There is no tenderness.  Musculoskeletal: Normal range of motion. He exhibits no edema and no tenderness.  Lymphadenopathy:    He has no cervical adenopathy.  Neurological: He is alert and oriented to person, place, and time. He has normal strength. No cranial nerve deficit or sensory deficit.  Skin: Skin is warm and dry.  Psychiatric: He has a normal mood and affect.     Diagnostic Tests:  *RADIOLOGY REPORT*   Clinical Data:  Chest pain, widened mediastinum   CT ANGIOGRAPHY CHEST, ABDOMEN AND PELVIS   Technique:  Multidetector CT imaging through the chest, abdomen and pelvis was performed using the standard protocol during  bolus administration of intravenous contrast.  Multiplanar reconstructed images including MIPs were obtained and reviewed to evaluate the vascular anatomy.   Contrast: 80mL OMNIPAQUE IOHEXOL 350 MG/ML SOLN   Comparison:   None.   CTA CHEST   Findings:  Negative for aneurysm, dissection, or stenosis. Scattered atheromatous plaque in the aortic arch and descending segment.  Patchy coronary calcifications.  Pulmonary arterial circulation unremarkable.  No evidence of filling defect to suggest acute PE.  Classic three-vessel brachiocephalic arterial origin anatomy without proximal stenosis. Left subclavian Transvenous pacemaker.   There is narrowing of the left innominate vein which appears occluded near its confluence with the SVC.  Multiple thyrocervical collaterals provide venous drainage of the left upper extremity.   There is lobular 2x4 cm anterior mediastinal mass or adenopathy with contiguous extension between the SVC and right brachiocephalic artery to the right paratracheal chain. There is a permeative cortical destruction in the right manubrium   contiguous  with this lesion.  There is bilateral hilar adenopathy, measuring up to 17 mm short axis diameter right, 14 mm left.  Prominent poorly marginated subcarinal nodes measuring up to 14 mm short axis diameter.  No pleural or pericardial effusion.  No mediastinal hematoma.  There is a 2.6 cm spiculated opacity at the left lung apex.  There is a more smoothly marginated 8 x 15 mm nodule in the posterior basal segment left lower lobe.  Minimal linear scarring or atelectasis in both lung bases.    Review of the MIP images confirms the above findings.   IMPRESSION: 1.  Negative for thoracic aortic dissection or pulmonary emboli. 2. 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 There is also other mediastinal and bilateral hilar adenopathy.   Considerations include lymphoma, thymoma, metastatic disease. Accessible for percutaneous biopsy if needed. 3.  Left apical and left lower lobe pulmonary lesions of uncertain etiology, possibly primary or secondary neoplasm.  PET-CT may be useful for further characterization. 4. Atherosclerosis, including aortic and coronary artery disease. Please note that although the presence of coronary artery calcium documents the presence of coronary artery disease, the severity of this disease and any potential stenosis cannot be assessed on this non-gated CT examination.  Assessment for potential risk factor modification, dietary therapy or pharmacologic therapy may be warranted, if clinically indicated.   CTA ABDOMEN AND PELVIS   Arterial findings: Aorta:                  Suprarenal segment is atheromatous and ectatic up to 3.1 cm diameter.  3.8 x 3.9 cm fusiform somewhat tortuous aneurysm of the infrarenal segment, tapering to a diameter of 2 cm at the bifurcation.   Celiac axis:            Patent, unremarkable distal branching   Superior mesenteric:Mild proximal plaque but no significant stenosis, unremarkable distal branching anatomy   Left renal:             Duplicated, inferior dominant, both widely patent   Right renal:            Single, with nonocclusive proximal plaque, patent distally   Inferior mesenteric:Patent, arising from the aneurysmal segment.   Left iliac:             Mildly tortuous without aneurysm or dissection.   Right iliac:            Tortuous and atheromatous without aneurysm or dissection.   Venous findings:  Dedicated venous phase imaging was not obtained.   Review of the MIP images confirms the above findings.   Nonvascular findings: Unremarkable arterial phase evaluation of liver, nondilated gallbladder, spleen, adrenal glands.  14 mm probable cyst from the lower pole left kidney.  10 mm possible cyst in the interpolar region right kidney.  No  hydronephrosis. Unremarkable pancreas.  Stomach, small bowel, and colon are nondilated.  No ascites.  No free air. No adenopathy localized. Urinary bladder is distended.  There is moderate prostatic enlargement.  Spondylitic changes in the lower lumbar spine.  No lytic or sclerotic osseous lesion is identified.   IMPRESSION:   1.  No acute abdominal process. 2.  3.9 cm fusiform infrarenal aortic aneurysm. 3.  No evidence of metastatic disease.     Original Report Authenticated By: D. Andria Rhein, MD     Impression:  He has extensive anterior mediastinal soft tissue density extending into the right paratracheal region with bilateral hilar  adenopathy and a spiculated lesion in the apex of the left lung suspicious for bronchogenic carcinoma. The anterior mediastinal mass is directly invading the posterior aspect of the manubrium and is most likely responsible for his pain. 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 He will require biopsy for tissue diagnosis but I think it would be best to do a PET scan first to decide on the most appropriate location for biopsy. He will be seen by radiation oncology for treatment of his mediastinal mass with manubrial invasion to palliate his pain. He will need to be formally seen by medical oncology once we have a tissue diagnosis.  Plan:  He will have a PET scan scheduled for early next week and I will see him back in the office afterwards to discuss the result with him and his daughter. Then we will decide on the most appropriate location for biopsy. He will require an MRI of the brain but we will wait until we have a tissue diagnosis.

## 2013-01-15 ENCOUNTER — Telehealth: Payer: Self-pay | Admitting: Radiation Oncology

## 2013-01-15 NOTE — Telephone Encounter (Signed)
Per protocol faxed PACEMAKER/ICD form to Dr. Lewayne Bunting for completion. Confirmation fax obtained.

## 2013-01-19 ENCOUNTER — Ambulatory Visit (HOSPITAL_COMMUNITY)
Admission: RE | Admit: 2013-01-19 | Discharge: 2013-01-19 | Disposition: A | Discharge status: Home / Self Care | Payer: Medicare PPO | Source: Ambulatory Visit | Attending: Surgery | Admitting: Surgery

## 2013-01-19 DIAGNOSIS — C778 Secondary and unspecified malignant neoplasm of lymph nodes of multiple regions: Secondary | ICD-10-CM | POA: Insufficient documentation

## 2013-01-19 DIAGNOSIS — I714 Abdominal aortic aneurysm, without rupture, unspecified: Secondary | ICD-10-CM | POA: Insufficient documentation

## 2013-01-19 DIAGNOSIS — C7951 Secondary malignant neoplasm of bone: Secondary | ICD-10-CM | POA: Insufficient documentation

## 2013-01-19 DIAGNOSIS — C7952 Secondary malignant neoplasm of bone marrow: Secondary | ICD-10-CM | POA: Insufficient documentation

## 2013-01-19 DIAGNOSIS — C787 Secondary malignant neoplasm of liver and intrahepatic bile duct: Secondary | ICD-10-CM | POA: Insufficient documentation

## 2013-01-19 DIAGNOSIS — R918 Other nonspecific abnormal finding of lung field: Secondary | ICD-10-CM

## 2013-01-19 DIAGNOSIS — J984 Other disorders of lung: Secondary | ICD-10-CM | POA: Insufficient documentation

## 2013-01-19 DIAGNOSIS — C801 Malignant (primary) neoplasm, unspecified: Secondary | ICD-10-CM | POA: Insufficient documentation

## 2013-01-19 DIAGNOSIS — R222 Localized swelling, mass and lump, trunk: Secondary | ICD-10-CM | POA: Insufficient documentation

## 2013-01-19 DIAGNOSIS — J9859 Other diseases of mediastinum, not elsewhere classified: Secondary | ICD-10-CM

## 2013-01-19 MED ORDER — FLUDEOXYGLUCOSE F - 18 (FDG) INJECTION
18.1000 | Freq: Once | INTRAVENOUS | Status: AC | PRN
  Administered 2013-01-19: 18.1 via INTRAVENOUS

## 2013-01-27 ENCOUNTER — Ambulatory Visit: Payer: Medicare PPO | Admitting: Surgery

## 2013-01-29 ENCOUNTER — Encounter: Payer: Self-pay | Admitting: Surgery

## 2013-01-29 ENCOUNTER — Other Ambulatory Visit: Payer: Self-pay

## 2013-01-29 ENCOUNTER — Ambulatory Visit (INDEPENDENT_AMBULATORY_CARE_PROVIDER_SITE_OTHER): Payer: Medicare PPO | Admitting: Surgery

## 2013-01-29 VITALS — BP 119/70 | HR 78 | Resp 20 | Ht 67.0 in | Wt 107.0 lb

## 2013-01-29 DIAGNOSIS — C771 Secondary and unspecified malignant neoplasm of intrathoracic lymph nodes: Secondary | ICD-10-CM

## 2013-01-29 DIAGNOSIS — K769 Liver disease, unspecified: Secondary | ICD-10-CM

## 2013-01-31 ENCOUNTER — Encounter: Payer: Self-pay | Admitting: Surgery

## 2013-01-31 NOTE — Progress Notes (Signed)
[address]       [name] [address]             [phone]        HPI:  The patient returns today to discuss the results of his PET scan and plan a biopsy for tissue diagnosis. He is here with his daughter who understands and speaks [place] well and an interpreter. The PET scan showed a large anterior mediastinal mass with intense FDG uptake as well as hypermetabolic lymph node mets to the right supraclavicular region, mediastinum, and bilateral hilar areas. There are multiple hypermetabolic liver mets. There is hypermetabolic activity in the proximal gastric wall and bone mets to the left proximal femur, sternum and sacrum.  Current Outpatient Prescriptions  Medication Sig Dispense Refill  . carvedilol (COREG) 6.25 MG tablet Take 6.25 mg by mouth 2 (two) times daily with a meal.      . furosemide (LASIX) 40 MG tablet Take 40 mg by mouth 2 (two) times daily.      Marland Kitchen HYDROcodone-acetaminophen (NORCO/VICODIN) 5-325 MG per tablet Take 2 tablets by mouth every 4 (four) hours as needed for pain.  10 tablet  0  . isosorbide dinitrate (ISORDIL) 10 MG tablet Take 1 tablet (10 mg total) by mouth every 8 (eight) hours.  90 tablet  5  . nitroGLYCERIN (NITROSTAT) 0.4 MG SL tablet Place 1 tablet (0.4 mg total) under the tongue every 5 (five) minutes x 3 doses as needed for chest pain.  25 tablet  4  . omeprazole (PRILOSEC OTC) 20 MG tablet Take 20 mg by mouth daily as needed (heart burn).       . ramipril (ALTACE) 10 MG capsule Take 1 capsule (10 mg total) by mouth daily.  30 capsule  5  . rosuvastatin (CRESTOR) 20 MG tablet Take 1 tablet (20 mg total) by mouth daily.  30 tablet  6   No current facility-administered medications for this visit.     Physical Exam: BP 119/70  Pulse 78  Resp 20  Ht 5\' 7"  (1.702 m)  Wt 107 lb (48.535 kg)  BMI 16.75 kg/m2  SpO2 95% He continues to looks cachectic and chronically ill. There are a couple of small, firm lymph nodes palpable  in the right supraclavicular region. Tender over the sternum. Lungs are clear.  Diagnostic Tests:  *RADIOLOGY REPORT*  Clinical Data: Initial treatment strategy for lung mass.  NUCLEAR MEDICINE PET SKULL BASE TO THIGH  Fasting Blood Glucose: 75  Technique: 18.1 mCi F-18 FDG was injected intravenously. CT data  was obtained and used for attenuation correction and anatomic  localization only. (This was not acquired as a diagnostic CT  examination.) Additional exam technical data entered on  technologist worksheet.  Comparison: 01/11/2013  Findings:  Neck: Hypermetabolic right supraclavicular lymph nodes are  identified. Index lymph node in the right supraclavicular region  has an SUV max equal to 5.5, image 62.  Chest: Anterior mediastinal mass is intensely hypermetabolic.  This measures 4.4 x 2.2 cm and has an SUV max equal to 10.30.  Hypermetabolic prevascular, subcarinal and bilateral hilar lymph  nodes are identified. Index left hilar lymph node has an SUV max  equal to 9.30, image 102. Left apical scarring and calcification  noted. Likely chronic. There is no hypermetabolic pulmonary  parenchymal nodule or mass identified. Scar like densities noted  within the posterior left lung base.  Abdomen/Pelvis: Multi focal liver lesions are identified  exhibiting increased  FDG uptake. Index lesion within the left  hepatic lobe measures 2.0 cm and has an SUV max equal to 10.7,  image 137. The index lesion within the right hepatic lobe measures  1.3 cm and has an SUV max equal to 5.6, image 139. No abnormal  uptake within the pancreas or the spleen. The adrenal glands both  appear normal.  There is an infrarenal abdominal aortic aneurysm. This measures  3.5 cm in AP dimension, image 166.  There is abnormal wall thickening involving the gastric fundus.  This measures up to 1.8 cm in maximum thickness. Intense FDG  uptake is associated with the thickened gastric wall. The SUV max  is  equal to 8.8  Skeleton: Multi focal areas of hypermetabolic bone metastasis  noted. There is a sclerotic lesion within the left femoral neck  with an SUV max equal to 5.5. Subtle focus of asymmetric increased  uptake within the right sacral wing is noted. Permeative  involvementof the sternal manubrium is identified. Here, the SUV  max is equal to 11.7.  IMPRESSION:  1. Large anterior mediastinal mass exhibits intense FDG uptake and  is concerning for tumor.  2. Hypermetabolic lymph node metastasis to the right  supraclavicular region, mediastinum and bilateral hilar areas.  3. Multi focal hypermetabolic liver metastasis.  4. Abnormal FDG uptake associated with proximal gastric wall  thickening. This may represent metastatic disease or primary  gastric neoplasm.  4. Hypermetabolic bone metastasis to the left of proximal femur,  sternum and sacrum.    Impression:  The patient was discussed at Thoracic Oncology Conference and IR thought that an US guided bx of the right supraclavicular lymph nodes would be the best initial biopsy. If that does not yield a diagnosis then a biopsy of one of the liver lesions would be done.  Plan:  We will schedule a biopsy by IR and have the patient return to [facility] clinic to see medical oncology and radiation oncology.

## 2013-02-02 ENCOUNTER — Ambulatory Visit (HOSPITAL_COMMUNITY)
Admission: RE | Admit: 2013-02-02 | Discharge: 2013-02-02 | Disposition: A | Discharge status: Home / Self Care | Payer: Medicare PPO | Source: Ambulatory Visit | Attending: Surgery | Admitting: Surgery

## 2013-02-02 ENCOUNTER — Other Ambulatory Visit: Payer: Self-pay | Admitting: Radiology

## 2013-02-02 DIAGNOSIS — I10 Essential (primary) hypertension: Secondary | ICD-10-CM | POA: Insufficient documentation

## 2013-02-02 DIAGNOSIS — K769 Liver disease, unspecified: Secondary | ICD-10-CM

## 2013-02-02 DIAGNOSIS — J4489 Other specified chronic obstructive pulmonary disease: Secondary | ICD-10-CM | POA: Insufficient documentation

## 2013-02-02 DIAGNOSIS — C801 Malignant (primary) neoplasm, unspecified: Secondary | ICD-10-CM | POA: Insufficient documentation

## 2013-02-02 DIAGNOSIS — C770 Secondary and unspecified malignant neoplasm of lymph nodes of head, face and neck: Secondary | ICD-10-CM | POA: Insufficient documentation

## 2013-02-02 DIAGNOSIS — I252 Old myocardial infarction: Secondary | ICD-10-CM | POA: Insufficient documentation

## 2013-02-02 DIAGNOSIS — Z8673 Personal history of transient ischemic attack (TIA), and cerebral infarction without residual deficits: Secondary | ICD-10-CM | POA: Insufficient documentation

## 2013-02-02 DIAGNOSIS — J449 Chronic obstructive pulmonary disease, unspecified: Secondary | ICD-10-CM | POA: Insufficient documentation

## 2013-02-02 LAB — CBC
HCT: 30.8 % — ABNORMAL LOW (ref 39.0–52.0)
Hemoglobin: 10.1 g/dL — ABNORMAL LOW (ref 13.0–17.0)
MCH: 28.6 pg (ref 26.0–34.0)
MCV: 87.3 fL (ref 78.0–100.0)
Platelets: 166 10*3/uL (ref 150–400)
RBC: 3.53 MIL/uL — ABNORMAL LOW (ref 4.22–5.81)
WBC: 6.5 10*3/uL (ref 4.0–10.5)

## 2013-02-02 MED ORDER — SODIUM CHLORIDE 0.9 % IV SOLN
Freq: Once | INTRAVENOUS | Status: AC
  Administered 2013-02-02: 13:00:00 via INTRAVENOUS

## 2013-02-02 MED ORDER — FENTANYL CITRATE 0.05 MG/ML IJ SOLN
INTRAMUSCULAR | Status: AC
  Filled 2013-02-02: qty 4

## 2013-02-02 MED ORDER — MIDAZOLAM HCL 2 MG/2ML IJ SOLN
INTRAMUSCULAR | Status: AC
  Filled 2013-02-02: qty 6

## 2013-02-02 NOTE — H&P (Signed)
[name] is an 72 y.o. male.   Chief Complaint: pt has had several months of illness; wt loss; chest pain June 2014 defibrillator was placed. Work up revealed abn CT with mediastinal and hilar masses PET shows + mediastinal mass; hilar masses; R supraclavicular nodes; liver masses; bony mets and gastric wall mass Scheduled now for R SCLN biopsy HPI: HTN; CAD/MI; defibrillator; COPD; CVA Speaks broken [place]- has interpreter with him; he does understand most [place]  Past Medical History  Diagnosis Date  . Hypertension   . Ischemic cardiomyopathy Sept 2011    EF improved to 35-40% 2D  . COPD (chronic obstructive pulmonary disease)   . LBBB (left bundle branch block)   . Acute anterior myocardial infarction 2011    s/p PCI to LAD; 2.25 x 12 mm BMS  . ICD (implantable cardiac defibrillator) in place   . Anginal pain   . Sleep apnea     "while sleeping last week" (11/12/2012)  . Stroke 2009    "sometimes left side goes numb/weak/gives out" (11/12/2012)  . Pacemaker     Past Surgical History  Procedure Laterality Date  . Bi-ventricular implantable cardioverter defibrillator  (crt-d)  11/12/2012  . Coronary angioplasty with stent placement  02/12/2010    LAD BMS  . Insert / replace / remove pacemaker      No family history on file. Social History:  reports that he quit smoking about 3 years ago. His smoking use included Cigarettes. He has a 22.5 pack-year smoking history. He has never used smokeless tobacco. He reports that he does not drink alcohol or use illicit drugs.  Allergies: No Known Allergies   (Not in a hospital admission)  Results for orders placed during the hospital encounter of 02/02/13 (from the past 48 hour(s))  APTT     Status: None   Collection Time    02/02/13 12:58 PM      Result Value Range   aPTT 32  24 - 37 seconds  CBC     Status: Abnormal   Collection Time    02/02/13 12:58 PM      Result Value Range   WBC 6.5  4.0 - 10.5 K/uL   RBC 3.53 (*) 4.22  - 5.81 MIL/uL   Hemoglobin 10.1 (*) 13.0 - 17.0 g/dL   HCT 13.0 (*) 86.5 - 78.4 %   MCV 87.3  78.0 - 100.0 fL   MCH 28.6  26.0 - 34.0 pg   MCHC 32.8  30.0 - 36.0 g/dL   RDW 69.6  29.5 - 28.4 %   Platelets 166  150 - 400 K/uL  PROTIME-INR     Status: None   Collection Time    02/02/13 12:58 PM      Result Value Range   Prothrombin Time 12.6  11.6 - 15.2 seconds   INR 0.96  0.00 - 1.49   No results found.  Review of Systems  Constitutional: Positive for weight loss. Negative for fever.  HENT: Negative for neck pain.   Respiratory: Negative for shortness of breath.   Cardiovascular: Negative for chest pain.  Gastrointestinal: Positive for nausea. Negative for vomiting and abdominal pain.  Neurological: Positive for weakness. Negative for headaches.    Blood pressure 177/91, pulse 67, temperature 97.1 F (36.2 C), temperature source Oral, resp. rate 18, height 5\' 7"  (1.702 m), weight 120 lb (54.432 kg), SpO2 98.00%. Physical Exam  Constitutional: He is oriented to person, place, and time.  Frail; thin Speaks little [place]-  understands most  Cardiovascular: Normal rate and regular rhythm.   No murmur heard. Respiratory: Effort normal and breath sounds normal. He has no wheezes.  GI: Soft. Bowel sounds are normal. There is no tenderness.  Musculoskeletal: Normal range of motion.  Neurological: He is alert and oriented to person, place, and time.  Skin: Skin is dry.  Psychiatric: He has a normal mood and affect. His behavior is normal.     Assessment/Plan Chronically ill; wt loss; chest pain +PET: mediastinal mass; hilar masses; R SCLN; liver masses; bony mets and gastric wall mass Scheduled now for R SCLN mass bx Pt aware of procedure benefits and risks and agreeable to proceed Consent signed and in chart  Bode Pieper A 02/02/2013, 2:07 PM

## 2013-02-02 NOTE — ED Notes (Signed)
No sedation given per pt's request (via interpreter)

## 2013-02-02 NOTE — Procedures (Signed)
Technically successful US guided biopsy of enlarged right supraclavicular lymph node.  No immediate complications.  

## 2013-02-08 ENCOUNTER — Encounter: Payer: Self-pay | Admitting: Radiation Oncology

## 2013-02-08 ENCOUNTER — Ambulatory Visit
Admission: RE | Admit: 2013-02-08 | Discharge: 2013-02-08 | Disposition: A | Discharge status: Home / Self Care | Payer: Medicare PPO | Source: Ambulatory Visit | Attending: Radiation Oncology | Admitting: Radiation Oncology

## 2013-02-08 DIAGNOSIS — Y842 Radiological procedure and radiotherapy as the cause of abnormal reaction of the patient, or of later complication, without mention of misadventure at the time of the procedure: Secondary | ICD-10-CM | POA: Insufficient documentation

## 2013-02-08 DIAGNOSIS — K208 Other esophagitis without bleeding: Secondary | ICD-10-CM | POA: Insufficient documentation

## 2013-02-08 DIAGNOSIS — Z79899 Other long term (current) drug therapy: Secondary | ICD-10-CM | POA: Insufficient documentation

## 2013-02-08 DIAGNOSIS — Z95810 Presence of automatic (implantable) cardiac defibrillator: Secondary | ICD-10-CM | POA: Insufficient documentation

## 2013-02-08 DIAGNOSIS — Z510 Encounter for antineoplastic radiation therapy: Secondary | ICD-10-CM | POA: Insufficient documentation

## 2013-02-08 DIAGNOSIS — C3490 Malignant neoplasm of unspecified part of unspecified bronchus or lung: Secondary | ICD-10-CM | POA: Insufficient documentation

## 2013-02-08 DIAGNOSIS — C7951 Secondary malignant neoplasm of bone: Secondary | ICD-10-CM | POA: Insufficient documentation

## 2013-02-08 DIAGNOSIS — C771 Secondary and unspecified malignant neoplasm of intrathoracic lymph nodes: Secondary | ICD-10-CM

## 2013-02-08 NOTE — Progress Notes (Signed)
  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  Identity was confirmed.  All relevant records and images related to the planned course of therapy were reviewed.  The patient freely provided informed written consent to proceed with treatment after reviewing the details related to the planned course of therapy. The consent form was witnessed and verified by the simulation staff.  Then, the patient was set-up in a stable reproducible  supine position for radiation therapy.  CT images were obtained.  Surface markings were placed.  The CT images were loaded into the planning software.  Then the target and avoidance structures were contoured.  Treatment planning then occurred.  The radiation prescription was entered and confirmed.  Then, I designed and supervised the construction of a total of 3 medically necessary complex treatment devices consisting of complex shielding to shape radiation around the targeted mass invading the manubrium shielding nearby critical strucures maximally, including the lungs, heart, and spinal cord, in addition to the patient's defibrillator.  I have requested : MLC's and an isodose plan.   PLAN:  The patient will receive 35 Gy in 14 fraction.  ________________________________  Artist Pais Kathrynn Running, M.D.

## 2013-02-08 NOTE — Progress Notes (Signed)
Weekly rad txs, 1st tx, gave  Radiation therapy and you book, alra, and radiaplex gel to start using, will give patient education tomorrow 9:22 AM

## 2013-02-09 ENCOUNTER — Telehealth: Payer: Self-pay | Admitting: *Deleted

## 2013-02-09 NOTE — Telephone Encounter (Signed)
Left VM message regarding appt next Thursday 02/18/13 at 3:00 arrive at 2:45

## 2013-02-11 ENCOUNTER — Ambulatory Visit
Admission: RE | Admit: 2013-02-11 | Discharge: 2013-02-11 | Disposition: A | Discharge status: Home / Self Care | Payer: Medicare PPO | Source: Ambulatory Visit | Attending: Radiation Oncology | Admitting: Radiation Oncology

## 2013-02-11 DIAGNOSIS — C771 Secondary and unspecified malignant neoplasm of intrathoracic lymph nodes: Secondary | ICD-10-CM

## 2013-02-11 NOTE — Progress Notes (Signed)
  Radiation Oncology         [phone] ________________________________  Name: [name] MRN: [id number]  Date: 02/11/2013  DOB: [birth year]  Simulation Verification Note  Status: outpatient  NARRATIVE: The patient was brought to the treatment unit and placed in the planned treatment position. The clinical setup was verified. Then port films were obtained and uploaded to the radiation oncology medical record software.  The treatment beams were carefully compared against the planned radiation fields. The position location and shape of the radiation fields was reviewed. They targeted volume of tissue appears to be appropriately covered by the radiation beams. Organs at risk appear to be excluded as planned.  Based on my personal review, I approved the simulation verification. The patient's treatment will proceed as planned.  ------------------------------------------------  Artist Pais Kathrynn Running, M.D.

## 2013-02-12 ENCOUNTER — Ambulatory Visit
Admission: RE | Admit: 2013-02-12 | Discharge: 2013-02-12 | Disposition: A | Discharge status: Home / Self Care | Payer: Medicare PPO | Source: Ambulatory Visit | Attending: Radiation Oncology | Admitting: Radiation Oncology

## 2013-02-12 ENCOUNTER — Encounter: Payer: Self-pay | Admitting: Radiation Oncology

## 2013-02-12 VITALS — BP 112/64 | HR 74 | Temp 97.7°F | Resp 16 | Wt 114.5 lb

## 2013-02-12 DIAGNOSIS — C771 Secondary and unspecified malignant neoplasm of intrathoracic lymph nodes: Secondary | ICD-10-CM

## 2013-02-12 NOTE — Progress Notes (Signed)
Denies cough or shortness of breath. Reports difficulty swallowing, taste changes, and diminished appetite. Patient reports normal weight one year ago of 130 lb. Weight today 114. Reports occasional dizziness. Reports feeling weak. Denies pain.

## 2013-02-12 NOTE — Progress Notes (Signed)
  Radiation Oncology         [phone] ________________________________  Name: [name] MRN: [id number]  Date: 02/12/2013  DOB: 1940/10/11  Weekly Radiation Therapy Management  Current Dose: 5 Gy     Planned Dose:  35 Gy  Narrative . . . . . . . . The patient presents for routine under treatment assessment.                                               He has continued sternal pain                                 Set-up films were reviewed.                                 The chart was checked. Physical Findings. . .  weight is 114 lb 8 oz (51.937 kg). His oral temperature is 97.7 F (36.5 C). His blood pressure is 112/64 and his pulse is 74. His respiration is 16 and oxygen saturation is 97%. . Weight essentially stable.  No significant changes. Impression . . . . . . . The patient is  tolerating radiation. Plan . . . . . . . . . . . . Continue treatment as planned.  Hopefully his pain will improve in the next week or so.  ________________________________  Artist Pais. Kathrynn Running, M.D.

## 2013-02-15 ENCOUNTER — Ambulatory Visit
Admission: RE | Admit: 2013-02-15 | Discharge: 2013-02-15 | Disposition: A | Discharge status: Home / Self Care | Payer: Medicare PPO | Source: Ambulatory Visit | Attending: Radiation Oncology | Admitting: Radiation Oncology

## 2013-02-15 ENCOUNTER — Encounter: Payer: Self-pay | Admitting: Radiation Oncology

## 2013-02-15 ENCOUNTER — Other Ambulatory Visit: Payer: Self-pay | Admitting: Radiation Oncology

## 2013-02-15 ENCOUNTER — Ambulatory Visit: Payer: Medicare PPO

## 2013-02-15 VITALS — BP 96/49 | HR 71 | Temp 97.5°F | Resp 16 | Wt 114.8 lb

## 2013-02-15 DIAGNOSIS — C771 Secondary and unspecified malignant neoplasm of intrathoracic lymph nodes: Secondary | ICD-10-CM

## 2013-02-15 MED ORDER — OXYCODONE-ACETAMINOPHEN 5-325 MG PO TABS: 1.0000 | ORAL_TABLET | ORAL | Status: DC | PRN

## 2013-02-15 NOTE — Progress Notes (Signed)
Patient requesting to see physician for back pain and weakness. Reports upper back pain between shoulder blades 5 on a scale of 0-10 for which he take ibuprofen without relief. Reports this pain is more intense anytime he lays flat. He reports this pain has been present for five days. Reports that one month ago following hospital discharge he fell but, denies falling since. Reports an occasional mild dry cough. Denies shortness of breath. Denies painful or difficult swallowing. Denies night sweats. Reports decreased appetite. Denies numbness of arms or legs. Denies incontinence. Blood pressure low. Patient reports feeling light headed and weak.

## 2013-02-15 NOTE — Progress Notes (Signed)
Weekly Management Note:  Site: Anterior chest/sternum Current Dose:  750  cGy Projected Dose: 3500  cGy  Narrative: The patient is seen today for routine under treatment assessment. CBCT/MVCT images/port films were reviewed. The chart was reviewed.   The patient is seen today for evaluation of new right upper back pain. He states that his pain is 5/10. He has been taking ibuprofen when necessary without much improvement.  Physical Examination:  Filed Vitals:   02/15/13 1002  BP: 96/49  Pulse: 71  Temp:   Resp:   .  Weight: 114 lb 12.8 oz (52.073 kg). He is in no acute respiratory distress. There is pain described along the right paravertebral region at approximately T5, medial to the right scapula. The posterior chest is symmetrical and no mass is appreciated. This is not worse with palpation. Lungs are clear.  Impression: Tolerating radiation therapy well, but new development of right paravertebral pain along the right upper back. I reviewed his PET scan from June and there is no evidence for metastatic disease to his thoracic spine. I'm not sure as to the origin of his pain. I'll start him on Percocet (5/325) to take 1 by mouth every 4 hours when necessary.  Plan: Continue radiation therapy as planned.

## 2013-02-16 ENCOUNTER — Ambulatory Visit
Admission: RE | Admit: 2013-02-16 | Discharge: 2013-02-16 | Disposition: A | Discharge status: Home / Self Care | Payer: Medicare PPO | Source: Ambulatory Visit | Attending: Radiation Oncology | Admitting: Radiation Oncology

## 2013-02-16 MED ORDER — RADIAPLEXRX EX GEL
Freq: Once | CUTANEOUS | Status: AC
  Administered 2013-02-16: 10:00:00 via TOPICAL

## 2013-02-16 NOTE — Addendum Note (Signed)
Encounter addended by: Agnes Lawrence, RN on: 02/16/2013 10:01 AM<BR>     Documentation filed: Charges VN, Visit Diagnoses, Notes Section, Inpatient Document Flowsheet, Inpatient Patient Education, Chief Complaint Section, Inpatient MAR, Orders

## 2013-02-16 NOTE — Progress Notes (Signed)
Late entry from 02/15/2013 at 1004. Oriented patient to staff and routine of the clinic. Educated patient reference potential side effects and management such as, skin changes and fatigue. Provided patient with radiaplex gel then, directed upon use. All questions answered. Patient verbalized understanding.

## 2013-02-17 ENCOUNTER — Ambulatory Visit
Admission: RE | Admit: 2013-02-17 | Discharge: 2013-02-17 | Disposition: A | Discharge status: Home / Self Care | Payer: Medicare PPO | Source: Ambulatory Visit | Attending: Radiation Oncology | Admitting: Radiation Oncology

## 2013-02-17 ENCOUNTER — Telehealth: Payer: Self-pay | Admitting: Internal Medicine

## 2013-02-17 NOTE — Telephone Encounter (Signed)
C/D 02/17/13 for appt. 02/18/13 °

## 2013-02-18 ENCOUNTER — Ambulatory Visit (HOSPITAL_BASED_OUTPATIENT_CLINIC_OR_DEPARTMENT_OTHER): Payer: Medicare PPO | Admitting: Internal Medicine

## 2013-02-18 ENCOUNTER — Encounter: Payer: Self-pay | Admitting: *Deleted

## 2013-02-18 ENCOUNTER — Telehealth: Payer: Self-pay | Admitting: Internal Medicine

## 2013-02-18 ENCOUNTER — Encounter: Payer: Self-pay | Admitting: Internal Medicine

## 2013-02-18 ENCOUNTER — Ambulatory Visit
Admission: RE | Admit: 2013-02-18 | Discharge: 2013-02-18 | Disposition: A | Discharge status: Home / Self Care | Payer: Medicare PPO | Source: Ambulatory Visit | Attending: Radiation Oncology | Admitting: Radiation Oncology

## 2013-02-18 VITALS — BP 97/76 | HR 67 | Temp 98.4°F | Resp 20 | Ht 66.0 in | Wt 114.6 lb

## 2013-02-18 DIAGNOSIS — C3490 Malignant neoplasm of unspecified part of unspecified bronchus or lung: Secondary | ICD-10-CM

## 2013-02-18 MED ORDER — PROCHLORPERAZINE MALEATE 10 MG PO TABS: 10.0000 mg | ORAL_TABLET | Freq: Four times a day (QID) | ORAL | Status: DC | PRN

## 2013-02-18 MED ORDER — LIDOCAINE-PRILOCAINE 2.5-2.5 % EX CREA: TOPICAL_CREAM | CUTANEOUS | Status: DC | PRN

## 2013-02-18 NOTE — Progress Notes (Signed)
   Thoracic Treatment Summary Name: Laker Thompson Date: 02/18/13 DOB: 08/07/1940 Your Medical Team Medical Oncologist: Dr. Arbutus Ped Radiation Oncologist: Dr. Kathrynn Running Pulmonologist: Surgeon: Dr. Laneta Simmers Type and Stage of Lung Cancer Non-Small Cell Carcinoma:  Squamous Cell Clinical Stage:  IV Pathological Stage:  Clinical stage is based on radiology exams.  Pathological stage will be determined after surgery.  Staging is based on the size of the tumor, involvement of lymph nodes or not, and whether or not the cancer center has spread. Recommendations Recommendations: Chemo and Radiation therapy  These recommendations are based on information available as of today's consult.  This is subject to change depending further testing or exams. Next Steps Next Step: 1. Medical Oncology will schedule chemotherapy treatments  Barriers to Care What do you perceive as a potential barrier that may prevent you from receiving your treatment plan? Communication-interpretor at visits Basic chemotherapy information given and explained  Resource information given and explained for [facility] Questions Willette Pa, RN BSN Thoracic Oncology Nurse Navigator at [phone]  Annabelle Harman is a nurse navigator that is available to assist you through your cancer journey.  She can answer your questions and/or provide resources regarding your treatment plan, emotional support, or financial concerns.

## 2013-02-18 NOTE — Telephone Encounter (Signed)
s.w. daughter and advised on chemo edu moved per [place]...ok an [place]

## 2013-02-18 NOTE — Telephone Encounter (Signed)
gv and printed appt sched and avs to pt...emailed Dr MM for d/t for 8.18.14 appt will call pt with d/t.Marland KitchenMarland Kitchen

## 2013-02-18 NOTE — Progress Notes (Signed)
[facility] [phone]   [phone]  Multidisciplinary thoracic oncology clinic (MTOC)  CONSULT NOTE  REFERRING PHYSICIAN: Dr. Evelene Croon.  REASON FOR CONSULTATION:  72 years old Asian male recently diagnosed with metastatic lung cancer.  HPI [name] is a 72 y.o. [place] male was past medical history significant for hypertension, COPD, myocardial infarction in 2011, ischemic cardiomyopathy with ejection fraction of 25%, status post pacemaker placement, history of stroke in 2008 as well as history of sleep apnea and long history of smoking but quit in 2006. The patient was seen by Dr. Ladona Ridgel for routine evaluation of her pacemaker placement and he was complaining of chest pain. He had CT angiogram of the chest performed on 01/11/2013 and it was negative for thoracic aortic dissection or pulmonary embolism but there was anterior mediastinal mass measuring 2.0 x 4.0 CM with contiguous mediastinal adenopathy and adjacent brain negative distraction of manubrium. There was also other mediastinal and bilateral hilar adenopathy. There was left apical and left lower lobe pulmonary lesions of uncertain etiology possibly primary or secondary neoplasm. The patient was referred to Dr. Laneta Simmers and a PET scan was performed on 01/19/2013 and it showed the large anterior mediastinal mass with intense FDG uptake concerning for tumor. There is hypermetabolic lymph node metastasis to the right supraclavicular region, mediastinum and bilateral hilar areas. There was multifocal hypermetabolic liver metastasis. There was also abnormal FDG uptake associated with proximal gastric oral thickening. This may represent metastatic disease or primary gastric neoplasm. There is hypermetabolic bone metastasis to the left proximal femur, sternum and sacrum. On 02/02/2013 the patient underwent ultrasound-guided right supraclavicular lymph node biopsy by interventional radiology. The final  pathology (Accession: [id number]) showed metastatic poorly differentiated carcinoma, consistent with squamous cell carcinoma. The malignant cells are positive for cytokeratin 5/6 and p63. They are negative for CD56, cytokeratin 20, cytokeratin 7, Napsin-A, synaptophysin and TTF-1. 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 He is currently undergoing palliative radiotherapy to the large anterior mediastinal mass under the care of Dr. Kathrynn Running and this is expected to be completed on 03/02/2013. Dr. Laneta Simmers kindly referred the patient to me today for further evaluation and recommendation regarding treatment of his condition. When seen today, the patient continues to complain of fatigue associated with radiotherapy. The patient also has pain in the upper chest with radiation to the back. He is currently on ibuprofen as needed. He does not take any narcotic medication because it caused him to have nausea and intolerance. He also has shortness breath at baseline and increased with exertion as well as mild cough. He lost around 6 pounds over the last few weeks. He has no headache or blurry vision. The patient is married and has 5 children. He was accompanied today by his daughter, [name] as well as his [place] interpreter. 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 He has a history of smoking for around 46 years but quit in 2006. He also has a history of alcohol abuse but not recently and no history of drug abuse.  @SFHPI @  Past Medical History  Diagnosis Date  . Hypertension   . Ischemic cardiomyopathy Sept 2011    EF improved to 35-40% 2D  . COPD (chronic obstructive pulmonary disease)   . LBBB (left bundle branch block)   . Acute anterior myocardial infarction 2011    s/p PCI to LAD; 2.25 x 12 mm BMS  . ICD (implantable cardiac defibrillator) in place   .  Anginal pain   . Sleep apnea     "while sleeping last week" (11/12/2012)  . Stroke 2009    "sometimes  left side goes numb/weak/gives out" (11/12/2012)  . Pacemaker     Past Surgical History  Procedure Laterality Date  . Bi-ventricular implantable cardioverter defibrillator  (crt-d)  11/12/2012  . Coronary angioplasty with stent placement  02/12/2010    LAD BMS  . Insert / replace / remove pacemaker      History reviewed. No pertinent family history.  Social History History  Substance Use Topics  . Smoking status: Former Smoker -- 0.50 packs/day for 45 years    Types: Cigarettes    Quit date: 03/13/2009  . Smokeless tobacco: Never Used  . Alcohol Use: No     Comment: 11/12/2012 "last alcohol 2008; never had problem w/it"    No Known Allergies  Current Outpatient Prescriptions  Medication Sig Dispense Refill  . carvedilol (COREG) 6.25 MG tablet Take 6.25 mg by mouth 2 (two) times daily with a meal.      . furosemide (LASIX) 40 MG tablet Take 40 mg by mouth 2 (two) times daily.      Marland Kitchen ibuprofen (ADVIL,MOTRIN) 400 MG tablet Take 400 mg by mouth every 6 (six) hours as needed for pain.      . isosorbide dinitrate (ISORDIL) 10 MG tablet Take 1 tablet (10 mg total) by mouth every 8 (eight) hours.  90 tablet  5  . ramipril (ALTACE) 10 MG capsule Take 1 capsule (10 mg total) by mouth daily.  30 capsule  5  . rosuvastatin (CRESTOR) 20 MG tablet Take 1 tablet (20 mg total) by mouth daily.  30 tablet  6  . Wound Cleansers (RADIAPLEX EX) Apply topically. Apply to chest and back bid but, not four hours prior to radiation treatment      . HYDROcodone-acetaminophen (NORCO/VICODIN) 5-325 MG per tablet Take 2 tablets by mouth every 4 (four) hours as needed for pain.  10 tablet  0  . lidocaine-prilocaine (EMLA) cream Apply topically as needed.  30 g  0  . nitroGLYCERIN (NITROSTAT) 0.4 MG SL tablet Place 1 tablet (0.4 mg total) under the tongue every 5 (five) minutes x 3 doses as needed for chest pain.  25 tablet  4  . omeprazole (PRILOSEC OTC) 20 MG tablet Take 20 mg by mouth daily as needed (heart  burn).       Marland Kitchen oxyCODONE-acetaminophen (PERCOCET/ROXICET) 5-325 MG per tablet Take 1 tablet by mouth every 4 (four) hours as needed for pain.  30 tablet  0  . prochlorperazine (COMPAZINE) 10 MG tablet Take 1 tablet (10 mg total) by mouth every 6 (six) hours as needed.  60 tablet  0   No current facility-administered medications for this visit.    Review of Systems  A comprehensive review of systems was negative except for: Constitutional: positive for anorexia, fatigue and weight loss Respiratory: positive for cough, dyspnea on exertion and wheezing Musculoskeletal: positive for muscle weakness  Physical Exam  YNW:GNFAO, healthy, no distress, ill looking and malnourished SKIN: skin color, texture, turgor are normal HEAD: Normocephalic, No masses, lesions, tenderness or abnormalities EYES: normal, PERRLA EARS: External ears normal OROPHARYNX:no exudate and no erythema  NECK: supple, no adenopathy LYMPH:  no palpable lymphadenopathy, no hepatosplenomegaly LUNGS: expiratory wheezes bilaterally HEART: regular rate & rhythm and no murmurs ABDOMEN:abdomen soft, non-tender, normal bowel sounds and no masses or organomegaly BACK: Back symmetric, no curvature. EXTREMITIES:no edema, no skin discoloration  NEURO: alert & oriented x 3 with fluent speech, no focal motor/sensory deficits  PERFORMANCE STATUS: ECOG 1-2  LABORATORY DATA: Lab Results  Component Value Date   WBC 6.5 02/02/2013   HGB 10.1* 02/02/2013   HCT 30.8* 02/02/2013   MCV 87.3 02/02/2013   PLT 166 02/02/2013      Chemistry      Component Value Date/Time   NA 139 01/11/2013 1417   K 4.4 01/11/2013 1417   CL 98 01/11/2013 1417   CO2 29 11/01/2012 0605   BUN 51* 01/11/2013 1417   CREATININE 2.10* 01/11/2013 1417      Component Value Date/Time   CALCIUM 9.2 11/01/2012 0605   ALKPHOS 81 10/30/2012 0520   AST 88* 10/30/2012 0520   ALT 91* 10/30/2012 0520   BILITOT 0.5 10/30/2012 0520       RADIOGRAPHIC STUDIES: Nm Pet  Image Initial (pi) Skull Base To Thigh  01/19/2013   *RADIOLOGY REPORT*  Clinical Data: Initial treatment strategy for lung mass.  NUCLEAR MEDICINE PET SKULL BASE TO THIGH  Fasting Blood Glucose:  75  Technique:  18.1 mCi F-18 FDG was injected intravenously. CT data was obtained and used for attenuation correction and anatomic localization only.  (This was not acquired as a diagnostic CT examination.) Additional exam technical data entered on technologist worksheet.  Comparison:  01/11/2013  Findings:  Neck: Hypermetabolic right supraclavicular lymph nodes are identified.  Index lymph node in the right supraclavicular region has an SUV max equal to 5.5, image 62.  Chest:  Anterior mediastinal mass is intensely hypermetabolic. This measures 4.4 x 2.2 cm and has an SUV max equal to 10.30. Hypermetabolic prevascular, subcarinal and bilateral hilar lymph nodes are identified.  Index left hilar lymph node has an SUV max equal to 9.30, image 102.  Left apical scarring and calcification noted.  Likely chronic.  There is no hypermetabolic pulmonary parenchymal nodule or mass identified.  Scar like densities noted within the posterior left lung base.  Abdomen/Pelvis:   Multi focal liver lesions are identified exhibiting increased FDG uptake.  Index lesion within the left hepatic lobe measures 2.0 cm and has an SUV max equal to 10.7, image 137.  The index lesion within the right hepatic lobe measures 1.3 cm and has an SUV max equal to 5.6, image 139.  No abnormal uptake within the pancreas or the spleen.  The adrenal glands both appear normal.  There is an infrarenal abdominal aortic aneurysm.  This measures 3.5 cm in AP dimension, image 166.  There is abnormal wall thickening involving the gastric fundus. This measures up to 1.8 cm in maximum thickness.  Intense FDG uptake is associated with the thickened gastric wall.  The SUV max is equal to 8.8  Skeleton:  Multi focal areas of hypermetabolic bone metastasis noted.  There  is a sclerotic lesion within the left femoral neck with an SUV max equal to 5.5.  Subtle focus of asymmetric increased uptake within the right sacral wing is noted. Permeative involvementof the sternal manubrium is identified. Here, the SUV max is equal to 11.7.  IMPRESSION:  1.  Large anterior mediastinal mass exhibits intense FDG uptake and is concerning for tumor. 2.  Hypermetabolic lymph node metastasis to the right supraclavicular region, mediastinum and bilateral hilar areas.  3.  Multi focal hypermetabolic liver metastasis. 4.  Abnormal FDG uptake associated with proximal gastric wall thickening.  This may represent metastatic disease or primary gastric neoplasm. 4.  Hypermetabolic bone metastasis to the  left of proximal femur, sternum and sacrum.   Original Report Authenticated By: Signa Kell, M.D.   US Biopsy  02/02/2013   *RADIOLOGY REPORT*  Indication: Hypermetabolic anterior mediastinal mass with hypermetabolic mediastinal, hilar and right supraclavicular lymphadenopathy.  ULTRASOUND GUIDED RIGHT SUPRACLAVICULAR LYMPH NODE BIOPSY  Comparisons: PET CT - 01/19/2013  Intravenous Medications: None  Complications: None immediate  Findings / Technique:  Informed written consent was obtained from the patient (via the use of a Orthoptist) after a discussion of the risks, benefits and alternatives to treatment.  Questions regarding the procedure were encouraged and answered.  Initial ultrasound scanning demonstrated to adjacent right supraclavicular lymph nodes, the largest of which measuring approximately 1.3 x 0.8 cm (image 3), correlating with the hypermetabolic right supraclavicular lymph nodes demonstrated on recent PET CT.  An ultrasound image was saved for documentation purposes.  The procedure was planned.  A timeout was performed prior to the initiation of the procedure.  The operative was prepped and draped in the usual sterile fashion, and a sterile drape was applied covering the  operative field.  A timeout was performed prior to the initiation of the procedure. Local anesthesia was provided with 1% lidocaine with epinephrine.  Under direct ultrasound guidance, an 18 gauge core needle device was utilized to obtain to obtain 5 core needle biopsied of the adjacent enlarged right supraclavicular lymph nodes.  Multiple ultrasound images were saved for documentation purposes.  The samples were placed in saline and submitted to pathology.  The needle was removed and hemostasis was achieved with manual compression.  Post procedure scan was negative for significant hematoma.  A dressing was placed.  The patient tolerated the procedure well without immediate postprocedural complication.  Impression:  Successful ultrasound guided right supraclavicular lymph node biopsy.   Original Report Authenticated By: Tacey Ruiz, MD    ASSESSMENT: This is a very pleasant 72 years old Asian male recently diagnosed with metastatic non-small cell lung cancer, squamous cell carcinoma with a large anterior mediastinal mass as well as bilateral lymphadenopathy and liver and bone metastases, diagnosed in July of 2014.   PLAN: I have a lengthy discussion with the patient and his daughter today about his current disease stage, prognosis and treatment options. I recommended for the patient to complete the course of radiotherapy under the care of Dr. Kathrynn Running as scheduled. I gave the patient the option of palliative systemic chemotherapy versus palliative care and hospice referral. The patient understands that he has incurable condition. He is interested in proceeding with systemic chemotherapy. I recommended for the patient her regimen consisting of carboplatin for AUC of 5 and paclitaxel 175 mg/M2 every 3 weeks with Neulasta support. I discussed with the patient adverse effect of the chemotherapy including but not limited to alopecia, myelosuppression, nausea and vomiting, peripheral neuropathy, liver or renal  dysfunction. I will complete the staging workup I ordered a CT scan of the head to rule out brain metastasis. The patient is expected to start the first cycle of this treatment on 03/09/2013. He would have a chemotherapy education class before starting the first cycle of his treatment. I will refer the patient to Dr. Laneta Simmers for consideration of Port-A-Cath placement. I will call his pharmacy with prescription for Compazine 10 mg by mouth every 6 hours as needed for nausea in addition to EMLA to the blood lab to the Port-A-Cath site before treatment. The patient would come back for followup visit in 2 weeks with the start of the first cycle  of his chemotherapy. I gave the patient and his family the time to ask questions and I answered them completely to their satisfaction. He was advised to call immediately if he has any concerning symptoms in the interval. The patient voices understanding of current disease status and treatment options and is in agreement with the current care plan.  All questions were answered. The patient knows to call the clinic with any problems, questions or concerns. We can certainly see the patient much sooner if necessary.  Thank you so much for allowing me to participate in the care of [facility]. I will continue to follow up the patient with you and assist in his care.  I spent 45 minutes counseling the patient face to face. The total time spent in the appointment was 60 minutes.  Mio Schellinger K. 02/18/2013, 2:57 PM  SUMMARY:  DISEASE STAGE: Stage (Tx, N3, M1b) non-small cell lung cancer, squamous cell carcinoma diagnosed in July 2014.  CHEMOTHERAPY INTENT: Palliative  CURRENT # OF CHEMOTHERAPY CYCLES: 0  CURRENT ANTIEMETICS: Zofran, dexamethasone and Compazine  CURRENT SMOKING STATUS: Quit smoking and 2006  ORAL CHEMOTHERAPY AND CONSENT: None  CURRENT BISPHOSPHONATES USE:  None  PAIN MANAGEMENT: Ibuprofen  NARCOTICS INDUCED CONSTIPATION:  N/A  LIVING WILL AND CODE STATUS: Full code

## 2013-02-19 ENCOUNTER — Ambulatory Visit
Admission: RE | Admit: 2013-02-19 | Discharge: 2013-02-19 | Disposition: A | Discharge status: Home / Self Care | Payer: Medicare PPO | Source: Ambulatory Visit | Attending: Radiation Oncology | Admitting: Radiation Oncology

## 2013-02-19 ENCOUNTER — Encounter: Payer: Self-pay | Admitting: Radiation Oncology

## 2013-02-19 ENCOUNTER — Ambulatory Visit (INDEPENDENT_AMBULATORY_CARE_PROVIDER_SITE_OTHER): Payer: Medicare PPO | Admitting: Internal Medicine

## 2013-02-19 ENCOUNTER — Encounter: Payer: Self-pay | Admitting: Internal Medicine

## 2013-02-19 VITALS — BP 93/60 | HR 70 | Ht 66.0 in | Wt 114.4 lb

## 2013-02-19 VITALS — BP 112/73 | HR 62 | Resp 18 | Wt 113.2 lb

## 2013-02-19 DIAGNOSIS — C771 Secondary and unspecified malignant neoplasm of intrathoracic lymph nodes: Secondary | ICD-10-CM

## 2013-02-19 DIAGNOSIS — I5041 Acute combined systolic (congestive) and diastolic (congestive) heart failure: Secondary | ICD-10-CM

## 2013-02-19 DIAGNOSIS — I255 Ischemic cardiomyopathy: Secondary | ICD-10-CM

## 2013-02-19 DIAGNOSIS — C3490 Malignant neoplasm of unspecified part of unspecified bronchus or lung: Secondary | ICD-10-CM

## 2013-02-19 DIAGNOSIS — I509 Heart failure, unspecified: Secondary | ICD-10-CM

## 2013-02-19 DIAGNOSIS — Z95810 Presence of automatic (implantable) cardiac defibrillator: Secondary | ICD-10-CM

## 2013-02-19 DIAGNOSIS — I2589 Other forms of chronic ischemic heart disease: Secondary | ICD-10-CM

## 2013-02-19 NOTE — Progress Notes (Signed)
Fatigue noted despite sleeping well. Reports constant pain across his chest 5 on a scale of 0-10. Persistent dry cough noted. Denies painful or difficult swallowing. Denies shortness of breath. Reports decreased appetite.  Slow steady decline of weight.

## 2013-02-19 NOTE — Patient Instructions (Addendum)
Your physician wants you to follow-up in: 9 months with Dr Taylor.  You will receive a reminder letter in the mail two months in advance. If you don't receive a letter, please call our office to schedule the follow-up appointment.  

## 2013-02-19 NOTE — Progress Notes (Signed)
   Department of Radiation Oncology  Phone:  [phone] Fax:        [phone]  Weekly Treatment Note    Name: [name] Date: 02/19/2013 MRN: [id number] DOB: January 07, 1941   Current dose: 17.5 Gy  Current fraction: 7   MEDICATIONS: Current Outpatient Prescriptions  Medication Sig Dispense Refill  . carvedilol (COREG) 6.25 MG tablet Take 6.25 mg by mouth 2 (two) times daily with a meal.      . ibuprofen (ADVIL,MOTRIN) 400 MG tablet Take 400 mg by mouth every 6 (six) hours as needed for pain.      . isosorbide dinitrate (ISORDIL) 10 MG tablet Take 1 tablet (10 mg total) by mouth every 8 (eight) hours.  90 tablet  5  . lidocaine-prilocaine (EMLA) cream Apply topically as needed.  30 g  0  . nitroGLYCERIN (NITROSTAT) 0.4 MG SL tablet Place 1 tablet (0.4 mg total) under the tongue every 5 (five) minutes x 3 doses as needed for chest pain.  25 tablet  4  . omeprazole (PRILOSEC OTC) 20 MG tablet Take 20 mg by mouth daily as needed (heart burn).       . prochlorperazine (COMPAZINE) 10 MG tablet Take 1 tablet (10 mg total) by mouth every 6 (six) hours as needed.  60 tablet  0  . ramipril (ALTACE) 10 MG capsule Take 1 capsule (10 mg total) by mouth daily.  30 capsule  5  . Wound Cleansers (RADIAPLEX EX) Apply topically. Apply to chest and back bid but, not four hours prior to radiation treatment      . furosemide (LASIX) 40 MG tablet Take 40 mg by mouth 2 (two) times daily.      Marland Kitchen HYDROcodone-acetaminophen (NORCO/VICODIN) 5-325 MG per tablet Take 2 tablets by mouth every 4 (four) hours as needed for pain.  10 tablet  0  . oxyCODONE-acetaminophen (PERCOCET/ROXICET) 5-325 MG per tablet Take 1 tablet by mouth every 4 (four) hours as needed for pain.  30 tablet  0  . rosuvastatin (CRESTOR) 20 MG tablet Take 1 tablet (20 mg total) by mouth daily.  30 tablet  6   No current facility-administered medications for this encounter.     ALLERGIES: Review of patient's allergies indicates no known  allergies.   LABORATORY DATA:  Lab Results  Component Value Date   WBC 6.5 02/02/2013   HGB 10.1* 02/02/2013   HCT 30.8* 02/02/2013   MCV 87.3 02/02/2013   PLT 166 02/02/2013   Lab Results  Component Value Date   NA 139 01/11/2013   K 4.4 01/11/2013   CL 98 01/11/2013   CO2 29 11/01/2012   Lab Results  Component Value Date   ALT 91* 10/30/2012   AST 88* 10/30/2012   ALKPHOS 81 10/30/2012   BILITOT 0.5 10/30/2012     NARRATIVE: [name] was seen today for weekly treatment management. The chart was checked and the patient's films were reviewed. The patient clinically has been stable. He began using Ensure yesterday because he has continued to lose some weight. He denies any esophagitis.  PHYSICAL EXAMINATION: weight is 113 lb 3.2 oz (51.347 kg). His blood pressure is 112/73 and his pulse is 62. His respiration is 18 and oxygen saturation is 100%.        ASSESSMENT: The patient is doing satisfactorily with treatment.  PLAN: We will continue with the patient's radiation treatment as planned.

## 2013-02-20 DIAGNOSIS — C3490 Malignant neoplasm of unspecified part of unspecified bronchus or lung: Secondary | ICD-10-CM | POA: Insufficient documentation

## 2013-02-20 NOTE — Patient Instructions (Signed)
SUMMARY:  DISEASE STAGE: Stage (Tx, N3, M1b) non-small cell lung cancer, squamous cell carcinoma diagnosed in July 2014.  CHEMOTHERAPY INTENT: Palliative  CURRENT # OF CHEMOTHERAPY CYCLES: 0  CURRENT ANTIEMETICS: Zofran, dexamethasone and Compazine  CURRENT SMOKING STATUS: Quit smoking and 2006  ORAL CHEMOTHERAPY AND CONSENT: None  CURRENT BISPHOSPHONATES USE:  None  PAIN MANAGEMENT: Ibuprofen  NARCOTICS INDUCED CONSTIPATION: N/A  LIVING WILL AND CODE STATUS: Full code

## 2013-02-22 ENCOUNTER — Encounter: Payer: Self-pay | Admitting: Internal Medicine

## 2013-02-22 ENCOUNTER — Other Ambulatory Visit: Payer: Self-pay | Admitting: *Deleted

## 2013-02-22 ENCOUNTER — Other Ambulatory Visit: Payer: Medicare PPO

## 2013-02-22 ENCOUNTER — Ambulatory Visit
Admission: RE | Admit: 2013-02-22 | Discharge: 2013-02-22 | Disposition: A | Discharge status: Home / Self Care | Payer: Medicare PPO | Source: Ambulatory Visit | Attending: Radiation Oncology | Admitting: Radiation Oncology

## 2013-02-22 ENCOUNTER — Other Ambulatory Visit: Payer: Self-pay | Admitting: Internal Medicine

## 2013-02-22 ENCOUNTER — Encounter (HOSPITAL_COMMUNITY): Payer: Self-pay

## 2013-02-22 ENCOUNTER — Ambulatory Visit (HOSPITAL_COMMUNITY)
Admission: RE | Admit: 2013-02-22 | Discharge: 2013-02-22 | Disposition: A | Discharge status: Home / Self Care | Payer: Medicare PPO | Source: Ambulatory Visit | Attending: Internal Medicine | Admitting: Internal Medicine

## 2013-02-22 DIAGNOSIS — IMO0002 Reserved for concepts with insufficient information to code with codable children: Secondary | ICD-10-CM

## 2013-02-22 DIAGNOSIS — C3490 Malignant neoplasm of unspecified part of unspecified bronchus or lung: Secondary | ICD-10-CM

## 2013-02-22 DIAGNOSIS — G319 Degenerative disease of nervous system, unspecified: Secondary | ICD-10-CM | POA: Insufficient documentation

## 2013-02-22 NOTE — Assessment & Plan Note (Signed)
His heart failure symptoms are currently well compensated class II. He'll continue his current cardiac medications.

## 2013-02-22 NOTE — Progress Notes (Signed)
HPI Mr. Azzara returns today for followup. He is a very pleasant 72 year old man with an ischemic cardiomyopathy, status post anterior MI approximately 3 years ago. He has severe left ventricular dysfunction, and developed worsening heart failure symptoms and underwent insertion of a biventricular ICD several months ago. He subsequently developed chest pain and a CT scan demonstrated an occult malignancy. He appears to have developed metastatic poorly differentiated squamous cell carcinoma, all diagnosed approximately 2 months after his ICD was implanted. He continues to lose weight and feel weak. He has nausea. No ICD shocks and no syncope. He has anorexia. No Known Allergies   Current Outpatient Prescriptions  Medication Sig Dispense Refill  . carvedilol (COREG) 6.25 MG tablet Take 6.25 mg by mouth 2 (two) times daily with a meal.      . furosemide (LASIX) 40 MG tablet Take 40 mg by mouth 2 (two) times daily.      Marland Kitchen ibuprofen (ADVIL,MOTRIN) 400 MG tablet Take 400 mg by mouth every 6 (six) hours as needed for pain.      Marland Kitchen lidocaine-prilocaine (EMLA) cream Apply topically as needed.  30 g  0  . nitroGLYCERIN (NITROSTAT) 0.4 MG SL tablet Place 1 tablet (0.4 mg total) under the tongue every 5 (five) minutes x 3 doses as needed for chest pain.  25 tablet  4  . oxyCODONE-acetaminophen (PERCOCET/ROXICET) 5-325 MG per tablet Take 1 tablet by mouth every 4 (four) hours as needed for pain.  30 tablet  0  . ramipril (ALTACE) 10 MG capsule Take 1 capsule (10 mg total) by mouth daily.  30 capsule  5  . rosuvastatin (CRESTOR) 20 MG tablet Take 1 tablet (20 mg total) by mouth daily.  30 tablet  6  . Wound Cleansers (RADIAPLEX EX) Apply topically. Apply to chest and back bid but, not four hours prior to radiation treatment       No current facility-administered medications for this visit.     Past Medical History  Diagnosis Date  . Hypertension   . Ischemic cardiomyopathy Sept 2011    EF improved to 35-40%  2D  . COPD (chronic obstructive pulmonary disease)   . LBBB (left bundle branch block)   . Acute anterior myocardial infarction 2011    s/p PCI to LAD; 2.25 x 12 mm BMS  . ICD (implantable cardiac defibrillator) in place   . Anginal pain   . Sleep apnea     "while sleeping last week" (11/12/2012)  . Stroke 2009    "sometimes left side goes numb/weak/gives out" (11/12/2012)  . Pacemaker     ROS:   All systems reviewed and negative except as noted in the HPI.   Past Surgical History  Procedure Laterality Date  . Bi-ventricular implantable cardioverter defibrillator  (crt-d)  11/12/2012  . Coronary angioplasty with stent placement  02/12/2010    LAD BMS  . Insert / replace / remove pacemaker       History reviewed. No pertinent family history.   History   Social History  . Marital Status: Married    Spouse Name: N/A    Number of Children: N/A  . Years of Education: N/A   Occupational History  . Not on file.   Social History Main Topics  . Smoking status: Former Smoker -- 0.50 packs/day for 45 years    Types: Cigarettes    Quit date: 03/13/2009  . Smokeless tobacco: Never Used  . Alcohol Use: No     Comment: 11/12/2012 "last alcohol  2008; never had problem w/it"  . Drug Use: No  . Sexually Active: Not on file   Other Topics Concern  . Not on file   Social History Narrative  . No narrative on file     BP 93/60  Pulse 70  Ht 5\' 6"  (1.676 m)  Wt 114 lb 6.4 oz (51.891 kg)  BMI 18.47 kg/m2  Physical Exam:  Chronically ill appearing 72 year old man, NAD HEENT: Unremarkable Neck:  6 cm JVD, no thyromegally Lungs:  Clear with no wheezes, rales, or rhonchi. HEART:  Regular rate rhythm, no murmurs, no rubs, no clicks Abd:  soft, positive bowel sounds, no organomegally, no rebound, no guarding Ext:  2 plus pulses, no edema, no cyanosis, no clubbing Skin:  No rashes no nodules Neuro:  CN II through XII intact, motor grossly intact  DEVICE  Normal device  function.  See PaceArt for details.   Assess/Plan:

## 2013-02-22 NOTE — Assessment & Plan Note (Signed)
He has had no anginal symptoms. No change in medical therapy.

## 2013-02-22 NOTE — Assessment & Plan Note (Signed)
He has stage 4 lung cancer and his prognosis is unfortunately a very poor. We have not turned off ICD tachycardia therapies today but couldn't do so if he becomes a consideration for hospice.

## 2013-02-23 ENCOUNTER — Telehealth: Payer: Self-pay | Admitting: Internal Medicine

## 2013-02-23 ENCOUNTER — Ambulatory Visit
Admission: RE | Admit: 2013-02-23 | Discharge: 2013-02-23 | Disposition: A | Discharge status: Home / Self Care | Payer: Medicare PPO | Source: Ambulatory Visit | Attending: Radiation Oncology | Admitting: Radiation Oncology

## 2013-02-23 ENCOUNTER — Encounter (HOSPITAL_COMMUNITY): Payer: Self-pay | Admitting: *Deleted

## 2013-02-23 LAB — ICD DEVICE OBSERVATION
AL IMPEDENCE ICD: 437 Ohm
HV IMPEDENCE: 57 Ohm
LV LEAD THRESHOLD: 0.75 V
RV LEAD AMPLITUDE: 16.4 mv
RV LEAD THRESHOLD: 0.75 V

## 2013-02-23 MED ORDER — DEXTROSE 5 % IV SOLN
1.5000 g | INTRAVENOUS | Status: AC
  Administered 2013-02-24: 1.5 g via INTRAVENOUS
  Filled 2013-02-23: qty 1.5

## 2013-02-23 NOTE — Telephone Encounter (Signed)
lvm for pt regarding advising on appts and to pick up updated sched at nxt visit

## 2013-02-23 NOTE — Telephone Encounter (Signed)
Returned a call to [facility] in Clinical Social Work re pt not being able to keep his appt w/[facility] 8/6 @ 8am due to pt having surgery. Moved 8/6 ched class to 8/13 @ 8am (d/t per [place]). S/w pt via interpreter and pt is not aware of surgery. S/w Ryan @ [facility] who says she s/w both pt's son/dtr and pt is to be @ [facility] short stay 8/6 @ 6:30am for port placement w/Dr. Laneta Simmers and is not to eat/drink anything after midnight. Information given to interpreter who communicated info to pt. radonc tech taking care of pt today also informed of above and that pt cannot make the 9:15am xrt appt tomorrow 8/6.

## 2013-02-24 ENCOUNTER — Ambulatory Visit: Payer: Medicare PPO

## 2013-02-24 ENCOUNTER — Encounter (HOSPITAL_COMMUNITY)
Admission: RE | Disposition: A | Discharge status: Home / Self Care | Payer: Self-pay | Source: Ambulatory Visit | Attending: Surgery

## 2013-02-24 ENCOUNTER — Encounter (HOSPITAL_COMMUNITY): Payer: Self-pay | Admitting: Certified Registered"

## 2013-02-24 ENCOUNTER — Ambulatory Visit (HOSPITAL_COMMUNITY): Payer: Medicare PPO

## 2013-02-24 ENCOUNTER — Encounter (HOSPITAL_COMMUNITY): Payer: Self-pay | Admitting: *Deleted

## 2013-02-24 ENCOUNTER — Ambulatory Visit (HOSPITAL_COMMUNITY): Payer: Medicare PPO | Admitting: Certified Registered"

## 2013-02-24 ENCOUNTER — Ambulatory Visit (HOSPITAL_COMMUNITY)
Admission: RE | Admit: 2013-02-24 | Discharge: 2013-02-24 | Disposition: A | Discharge status: Home / Self Care | Payer: Medicare PPO | Source: Ambulatory Visit | Attending: Surgery | Admitting: Surgery

## 2013-02-24 ENCOUNTER — Other Ambulatory Visit: Payer: Medicare PPO

## 2013-02-24 DIAGNOSIS — I447 Left bundle-branch block, unspecified: Secondary | ICD-10-CM | POA: Insufficient documentation

## 2013-02-24 DIAGNOSIS — I2510 Atherosclerotic heart disease of native coronary artery without angina pectoris: Secondary | ICD-10-CM | POA: Insufficient documentation

## 2013-02-24 DIAGNOSIS — I2589 Other forms of chronic ischemic heart disease: Secondary | ICD-10-CM | POA: Diagnosis not present

## 2013-02-24 DIAGNOSIS — C3490 Malignant neoplasm of unspecified part of unspecified bronchus or lung: Secondary | ICD-10-CM | POA: Insufficient documentation

## 2013-02-24 DIAGNOSIS — I10 Essential (primary) hypertension: Secondary | ICD-10-CM | POA: Insufficient documentation

## 2013-02-24 DIAGNOSIS — Z79899 Other long term (current) drug therapy: Secondary | ICD-10-CM | POA: Diagnosis not present

## 2013-02-24 DIAGNOSIS — C771 Secondary and unspecified malignant neoplasm of intrathoracic lymph nodes: Secondary | ICD-10-CM | POA: Insufficient documentation

## 2013-02-24 DIAGNOSIS — J449 Chronic obstructive pulmonary disease, unspecified: Secondary | ICD-10-CM | POA: Insufficient documentation

## 2013-02-24 DIAGNOSIS — Z95810 Presence of automatic (implantable) cardiac defibrillator: Secondary | ICD-10-CM | POA: Diagnosis not present

## 2013-02-24 DIAGNOSIS — C7951 Secondary malignant neoplasm of bone: Secondary | ICD-10-CM | POA: Insufficient documentation

## 2013-02-24 DIAGNOSIS — I252 Old myocardial infarction: Secondary | ICD-10-CM | POA: Diagnosis not present

## 2013-02-24 DIAGNOSIS — Z923 Personal history of irradiation: Secondary | ICD-10-CM | POA: Diagnosis not present

## 2013-02-24 DIAGNOSIS — I509 Heart failure, unspecified: Secondary | ICD-10-CM | POA: Diagnosis not present

## 2013-02-24 DIAGNOSIS — C787 Secondary malignant neoplasm of liver and intrahepatic bile duct: Secondary | ICD-10-CM | POA: Diagnosis not present

## 2013-02-24 DIAGNOSIS — Z8673 Personal history of transient ischemic attack (TIA), and cerebral infarction without residual deficits: Secondary | ICD-10-CM | POA: Insufficient documentation

## 2013-02-24 DIAGNOSIS — J4489 Other specified chronic obstructive pulmonary disease: Secondary | ICD-10-CM | POA: Insufficient documentation

## 2013-02-24 DIAGNOSIS — I5022 Chronic systolic (congestive) heart failure: Secondary | ICD-10-CM | POA: Diagnosis not present

## 2013-02-24 DIAGNOSIS — Z87891 Personal history of nicotine dependence: Secondary | ICD-10-CM | POA: Diagnosis not present

## 2013-02-24 DIAGNOSIS — Z9861 Coronary angioplasty status: Secondary | ICD-10-CM | POA: Diagnosis not present

## 2013-02-24 DIAGNOSIS — G4730 Sleep apnea, unspecified: Secondary | ICD-10-CM | POA: Insufficient documentation

## 2013-02-24 DIAGNOSIS — IMO0002 Reserved for concepts with insufficient information to code with codable children: Secondary | ICD-10-CM

## 2013-02-24 HISTORY — DX: Headache: R51

## 2013-02-24 HISTORY — DX: Shortness of breath: R06.02

## 2013-02-24 HISTORY — PX: PORTACATH PLACEMENT: SHX2246

## 2013-02-24 HISTORY — DX: Malignant (primary) neoplasm, unspecified: C80.1

## 2013-02-24 LAB — COMPREHENSIVE METABOLIC PANEL
Albumin: 3.4 g/dL — ABNORMAL LOW (ref 3.5–5.2)
Alkaline Phosphatase: 91 U/L (ref 39–117)
BUN: 51 mg/dL — ABNORMAL HIGH (ref 6–23)
CO2: 29 mEq/L (ref 19–32)
Chloride: 94 mEq/L — ABNORMAL LOW (ref 96–112)
Creatinine, Ser: 2.51 mg/dL — ABNORMAL HIGH (ref 0.50–1.35)
GFR calc Af Amer: 28 mL/min — ABNORMAL LOW (ref >90)
GFR calc non Af Amer: 24 mL/min — ABNORMAL LOW (ref >90)
Glucose, Bld: 93 mg/dL (ref 70–99)
Potassium: 3.9 mEq/L (ref 3.5–5.1)
Total Bilirubin: 0.4 mg/dL (ref 0.3–1.2)

## 2013-02-24 LAB — PROTIME-INR
INR: 1 (ref 0.00–1.49)
Prothrombin Time: 13 seconds (ref 11.6–15.2)

## 2013-02-24 LAB — CBC
HCT: 29.6 % — ABNORMAL LOW (ref 39.0–52.0)
Hemoglobin: 9.7 g/dL — ABNORMAL LOW (ref 13.0–17.0)
MCV: 87.3 fL (ref 78.0–100.0)
RDW: 13.7 % (ref 11.5–15.5)
WBC: 5.7 10*3/uL (ref 4.0–10.5)

## 2013-02-24 LAB — APTT: aPTT: 34 seconds (ref 24–37)

## 2013-02-24 SURGERY — INSERTION, TUNNELED CENTRAL VENOUS DEVICE, WITH PORT
Anesthesia: Monitor Anesthesia Care | Laterality: Right | Wound class: Clean

## 2013-02-24 MED ORDER — MIDAZOLAM HCL 5 MG/5ML IJ SOLN
INTRAMUSCULAR | Status: DC | PRN
  Administered 2013-02-24 (×2): 1 mg via INTRAVENOUS

## 2013-02-24 MED ORDER — HEPARIN SODIUM (PORCINE) 1000 UNIT/ML DIALYSIS
INTRAMUSCULAR | Status: DC | PRN
  Administered 2013-02-24: 5000 [IU] via INTRAVENOUS_CENTRAL

## 2013-02-24 MED ORDER — LIDOCAINE-EPINEPHRINE (PF) 1 %-1:200000 IJ SOLN
INTRAMUSCULAR | Status: DC | PRN
  Administered 2013-02-24: 30 mL

## 2013-02-24 MED ORDER — PHENYLEPHRINE HCL 10 MG/ML IJ SOLN
INTRAMUSCULAR | Status: DC | PRN
  Administered 2013-02-24 (×5): 40 ug via INTRAVENOUS

## 2013-02-24 MED ORDER — SODIUM CHLORIDE 0.9 % IR SOLN
Status: DC | PRN
  Administered 2013-02-24: 08:00:00

## 2013-02-24 MED ORDER — FENTANYL CITRATE 0.05 MG/ML IJ SOLN
INTRAMUSCULAR | Status: DC | PRN
  Administered 2013-02-24 (×2): 25 ug via INTRAVENOUS

## 2013-02-24 MED ORDER — FENTANYL CITRATE 0.05 MG/ML IJ SOLN: 25.0000 ug | INTRAMUSCULAR | Status: DC | PRN

## 2013-02-24 MED ORDER — PROPOFOL 10 MG/ML IV BOLUS
INTRAVENOUS | Status: DC | PRN
  Administered 2013-02-24: 20 mg via INTRAVENOUS

## 2013-02-24 MED ORDER — LACTATED RINGERS IV SOLN
INTRAVENOUS | Status: DC | PRN
  Administered 2013-02-24: 08:00:00 via INTRAVENOUS

## 2013-02-24 SURGICAL SUPPLY — 42 items
ADH SKN CLS APL DERMABOND .7 (GAUZE/BANDAGES/DRESSINGS) ×2
BAG DECANTER FOR FLEXI CONT (MISCELLANEOUS) ×2 IMPLANT
BLADE SURG 15 STRL LF DISP TIS (BLADE) ×1 IMPLANT
BLADE SURG 15 STRL SS (BLADE) ×2
CANISTER SUCTION 2500CC (MISCELLANEOUS) ×2 IMPLANT
CLOTH BEACON ORANGE TIMEOUT ST (SAFETY) ×2 IMPLANT
COVER SURGICAL LIGHT HANDLE (MISCELLANEOUS) ×2 IMPLANT
DERMABOND ADVANCED (GAUZE/BANDAGES/DRESSINGS) ×2
DERMABOND ADVANCED .7 DNX12 (GAUZE/BANDAGES/DRESSINGS) ×1 IMPLANT
DRAPE C-ARM 42X72 X-RAY (DRAPES) ×2 IMPLANT
DRAPE CHEST BREAST 15X10 FENES (DRAPES) ×2 IMPLANT
DRAPE INCISE IOBAN 66X45 STRL (DRAPES) ×1 IMPLANT
ELECT CAUTERY BLADE 6.4 (BLADE) ×2 IMPLANT
ELECT REM PT RETURN 9FT ADLT (ELECTROSURGICAL) ×2
ELECTRODE REM PT RTRN 9FT ADLT (ELECTROSURGICAL) ×1 IMPLANT
GLOVE BIOGEL PI IND STRL 6 (GLOVE) IMPLANT
GLOVE BIOGEL PI IND STRL 6.5 (GLOVE) IMPLANT
GLOVE BIOGEL PI INDICATOR 6 (GLOVE) ×1
GLOVE BIOGEL PI INDICATOR 6.5 (GLOVE) ×1
GLOVE EUDERMIC 7 POWDERFREE (GLOVE) ×2 IMPLANT
GOWN PREVENTION PLUS XLARGE (GOWN DISPOSABLE) ×2 IMPLANT
GOWN STRL NON-REIN LRG LVL3 (GOWN DISPOSABLE) ×2 IMPLANT
KIT BASIN OR (CUSTOM PROCEDURE TRAY) ×2 IMPLANT
KIT PORT POWER 9.6FR MRI PREA (Catheter) ×1 IMPLANT
KIT PORT POWER ISP 8FR (Catheter) IMPLANT
KIT POWER CATH 8FR (Catheter) IMPLANT
KIT ROOM TURNOVER OR (KITS) ×2 IMPLANT
NEEDLE 22X1 1/2 (OR ONLY) (NEEDLE) ×2 IMPLANT
NS IRRIG 1000ML POUR BTL (IV SOLUTION) ×2 IMPLANT
PACK GENERAL/GYN (CUSTOM PROCEDURE TRAY) ×2 IMPLANT
PAD ARMBOARD 7.5X6 YLW CONV (MISCELLANEOUS) ×4 IMPLANT
SPONGE GAUZE 4X4 12PLY (GAUZE/BANDAGES/DRESSINGS) ×1 IMPLANT
SUT SILK 1 SH (SUTURE) ×2 IMPLANT
SUT SILK 2 0 SH (SUTURE) ×3 IMPLANT
SUT VIC AB 3-0 SH 27 (SUTURE) ×2
SUT VIC AB 3-0 SH 27X BRD (SUTURE) ×1 IMPLANT
SUT VIC AB 4-0 PS2 27 (SUTURE) ×2 IMPLANT
SYR 20CC LL (SYRINGE) ×2 IMPLANT
SYR CONTROL 10ML LL (SYRINGE) ×2 IMPLANT
TOWEL OR 17X24 6PK STRL BLUE (TOWEL DISPOSABLE) ×2 IMPLANT
TOWEL OR 17X26 10 PK STRL BLUE (TOWEL DISPOSABLE) ×2 IMPLANT
WATER STERILE IRR 1000ML POUR (IV SOLUTION) ×2 IMPLANT

## 2013-02-24 NOTE — Anesthesia Preprocedure Evaluation (Addendum)
Anesthesia Evaluation  Patient identified by MRN, date of birth, ID band Patient awake and Patient confused    Reviewed: Allergy & Precautions, H&P , NPO status , Patient's Chart, lab work & pertinent test results, reviewed documented beta blocker date and time   History of Anesthesia Complications Negative for: history of anesthetic complications  Airway Mallampati: II TM Distance: >3 FB Neck ROM: Full    Dental  (+) Poor Dentition   Pulmonary shortness of breath, sleep apnea , COPD   + decreased breath sounds      Cardiovascular hypertension, Pt. on medications and Pt. on home beta blockers + angina at rest + CAD, + Past MI, + Peripheral Vascular Disease and +CHF (EF 25%) + dysrhythmias + pacemaker Rhythm:Regular Rate:Normal     Neuro/Psych  Headaches, CVA    GI/Hepatic negative GI ROS,   Endo/Other  negative endocrine ROS  Renal/GU negative Renal ROS     Musculoskeletal   Abdominal   Peds  Hematology   Anesthesia Other Findings   Reproductive/Obstetrics                         Anesthesia Physical Anesthesia Plan  ASA: III  Anesthesia Plan: MAC   Post-op Pain Management:    Induction: Intravenous  Airway Management Planned:   Additional Equipment:   Intra-op Plan:   Post-operative Plan:   Informed Consent: I have reviewed the patients History and Physical, chart, labs and discussed the procedure including the risks, benefits and alternatives for the proposed anesthesia with the patient or authorized representative who has indicated his/her understanding and acceptance.     Plan Discussed with: CRNA and Anesthesiologist  Anesthesia Plan Comments: (Stage 4 lung Ca Ischemic cardiomyopathy EF 35% with Medtronic AICD now off Htn Renal insuff Cr 2.51 K 3.9  Plan MAC    )        Anesthesia Quick Evaluation

## 2013-02-24 NOTE — Anesthesia Procedure Notes (Signed)
Procedure Name: MAC Date/Time: 02/24/2013 8:36 AM Performed by: Sheppard Evens Pre-anesthesia Checklist: Patient identified, Emergency Drugs available, Suction available, Patient being monitored and Timeout performed Patient Re-evaluated:Patient Re-evaluated prior to inductionOxygen Delivery Method: Simple face mask

## 2013-02-24 NOTE — H&P (Signed)
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 He has a history of ischemic cardiomyopathy with an ejection fraction of 25% and chronic systolic congestive heart failure. He was admitted in April 2014 with an exacerbation of his heart failure and subsequently underwent implantation of a Medtronic CRTD by Dr. Ladona Ridgel. At the time of admission he was having pain across the upper chest that is present almost all the time. It is not related to exertion. He had a CT scan the chest on 01/11/2013 which showed an anterior mediastinal soft tissue mass or adenopathy with contiguous extension between the superior vena cava and right brachiocephalic artery to the right paratracheal region. There was cortical destruction in the right manubrium contiguous with the lesion. There is also bilateral hilar adenopathy and a 2.6 cm spiculated opacity in the left lung apex. There is a more smoothly marginated 8 x 15 mm nodule in the posterior basal segment of left lower lobe. PET scan showed a large anterior mediastinal mass with intense FDG uptake as well as hypermetabolic lymph node mets to the right supraclavicular region, mediastinum, and bilateral hilar areas. There are multiple hypermetabolic liver mets. There is hypermetabolic activity in the proximal gastric wall and bone mets to the left proximal femur, sternum and sacrum. On 02/02/2013 the patient underwent ultrasound-guided right supraclavicular lymph node biopsy by interventional radiology. The final pathology (Accession: [id number]) showed metastatic poorly differentiated carcinoma, consistent with squamous cell carcinoma. The malignant cells are positive for cytokeratin 5/6 and p63. They are negative for  CD56, cytokeratin 20, cytokeratin 7, Napsin-A, synaptophysin and TTF-1. 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  He is currently undergoing palliative radiotherapy to the large anterior mediastinal mass under the care of Dr. Kathrynn Running and this is expected to be completed on 03/02/2013. He needs a portacath for chemotherapy.   Past Medical History   Diagnosis  Date   .  Hypertension    .  Ischemic cardiomyopathy  Sept 2011     EF improved to 35-40% 2D   .  COPD (chronic obstructive pulmonary disease)    .  LBBB (left bundle branch block)    .  Acute anterior myocardial infarction  2011     s/p PCI to LAD; 2.25 x 12 mm BMS   .  ICD (implantable cardiac defibrillator) in place    .  Anginal pain    .  Sleep apnea      "while sleeping last week" (11/12/2012)   .  Stroke  2009     "sometimes left side goes numb/weak/gives out" (11/12/2012)   .  Pacemaker     Past Surgical History   Procedure  Laterality  Date   .  Bi-ventricular implantable cardioverter defibrillator (crt-d)   11/12/2012   .  Coronary angioplasty with stent placement   02/12/2010     LAD BMS   .  Insert / replace / remove pacemaker     History reviewed. No pertinent family history.  Social History  History   Substance Use Topics   .  Smoking status:  Former Smoker -- 0.50 packs/day  for 45 years     Types:  Cigarettes     Quit date:  03/13/2009   .  Smokeless tobacco:  Never Used   .  Alcohol Use:  No      Comment: 11/12/2012 "last alcohol 2008; never had problem w/it"    Current Outpatient Prescriptions   Medication  Sig  Dispense  Refill   .  carvedilol (COREG) 6.25 MG tablet  Take 6.25 mg by mouth 2 (two) times daily with a meal.     .  furosemide (LASIX) 40 MG tablet  Take 40 mg by mouth 2 (two) times daily.     Marland Kitchen  HYDROcodone-acetaminophen (NORCO/VICODIN) 5-325 MG per tablet  Take 2 tablets by mouth every 4 (four) hours as needed for pain.  10 tablet  0   .  isosorbide dinitrate  (ISORDIL) 10 MG tablet  Take 1 tablet (10 mg total) by mouth every 8 (eight) hours.  90 tablet  5   .  nitroGLYCERIN (NITROSTAT) 0.4 MG SL tablet  Place 1 tablet (0.4 mg total) under the tongue every 5 (five) minutes x 3 doses as needed for chest pain.  25 tablet  4   .  omeprazole (PRILOSEC OTC) 20 MG tablet  Take 20 mg by mouth daily as needed (heart burn).     .  ramipril (ALTACE) 10 MG capsule  Take 1 capsule (10 mg total) by mouth daily.  30 capsule  5   .  rosuvastatin (CRESTOR) 20 MG tablet  Take 1 tablet (20 mg total) by mouth daily.  30 tablet  6    No current facility-administered medications for this visit.   No Known Allergies  Review of Systems  Constitutional: Positive for appetite change, fatigue and unexpected weight change. Negative for fever and chills.  Poor appetite and approximately 10 lb wt loss.  HENT: Negative.  Eyes: Negative.  Respiratory: Positive for cough and chest tightness. Negative for shortness of breath.  Cardiovascular: Positive for chest pain. Negative for palpitations and leg swelling.  Gastrointestinal: Negative.  Endocrine: Negative.  Genitourinary: Negative.  Musculoskeletal: Positive for back pain.  Pain over upper anterior chest wall  Skin: Negative.  Allergic/Immunologic: Negative.  Neurological: Negative.  Hematological: Negative.  Psychiatric/Behavioral: Negative.  There were no vitals taken for this visit.  Physical Exam  Constitutional: He is oriented to person, place, and time.  Cachectic asian gentleman who looks chronically ill and uncomfortable.  HENT:  Head: Normocephalic and atraumatic.  Mouth/Throat: Oropharynx is clear and moist.  Eyes: Conjunctivae and EOM are normal. Pupils are equal, round, and reactive to light.  Neck: Normal range of motion. Neck supple. No JVD present. No tracheal deviation present. No thyromegaly present.  No supraclavicular adenopathy  Cardiovascular: Normal rate, regular rhythm, normal heart sounds and  intact distal pulses. Exam reveals no gallop and no friction rub.  No murmur heard.  Pulmonary/Chest: Effort normal and breath sounds normal. No respiratory distress. He has no wheezes. He has no rales. He exhibits tenderness.  Tender over manubrium.  Abdominal: Soft. Bowel sounds are normal. He exhibits no distension and no mass. There is no tenderness.  Musculoskeletal: Normal range of motion. He exhibits no edema and no tenderness.  Lymphadenopathy:  He has no cervical adenopathy.  Neurological: He is alert and oriented to person, place, and time. He has normal strength. No cranial nerve deficit or sensory deficit.  Skin: Skin is warm and dry.  Psychiatric: He has a normal mood and affect.  Diagnostic Tests:  *RADIOLOGY REPORT*  Clinical Data: Chest pain, widened mediastinum  CT ANGIOGRAPHY CHEST, ABDOMEN AND PELVIS  Technique: Multidetector CT imaging through the chest, abdomen and  pelvis was performed using the standard protocol during bolus  administration of intravenous contrast. Multiplanar reconstructed  images including MIPs were obtained and reviewed to evaluate the  vascular anatomy.  Contrast: 80mL OMNIPAQUE IOHEXOL 350 MG/ML SOLN  Comparison: None.  CTA CHEST  Findings: Negative for aneurysm, dissection, or stenosis.  Scattered atheromatous plaque in the aortic arch and descending  segment. Patchy coronary calcifications. Pulmonary arterial  circulation unremarkable. No evidence of filling defect to suggest  acute PE. Classic three-vessel brachiocephalic arterial origin  anatomy without proximal stenosis. Left subclavian Transvenous  pacemaker. There is narrowing of the left innominate vein which  appears occluded near its confluence with the SVC. Multiple  thyrocervical collaterals provide venous drainage of the left upper  extremity.  There is lobular 2x4 cm anterior mediastinal mass or adenopathy  with contiguous extension between the SVC and right brachiocephalic    artery to the right paratracheal chain. There is a permeative  cortical destruction in the right manubrium contiguous with this  lesion. There is bilateral hilar adenopathy, measuring up to 17 mm  short axis diameter right, 14 mm left. Prominent poorly marginated  subcarinal nodes measuring up to 14 mm short axis diameter. No  pleural or pericardial effusion. No mediastinal hematoma. There  is a 2.6 cm spiculated opacity at the left lung apex. There is a  more smoothly marginated 8 x 15 mm nodule in the posterior basal  segment left lower lobe. Minimal linear scarring or atelectasis in  both lung bases.  Review of the MIP images confirms the above findings.  IMPRESSION:  1. Negative for thoracic aortic dissection or pulmonary emboli.  2. 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 There is also  other mediastinal and bilateral hilar adenopathy. Considerations  include lymphoma, thymoma, metastatic disease. Accessible for  percutaneous biopsy if needed.  3. Left apical and left lower lobe pulmonary lesions of uncertain  etiology, possibly primary or secondary neoplasm. PET-CT may be  useful for further characterization.  4. Atherosclerosis, including aortic and coronary artery disease.  Please note that although the presence of coronary artery calcium  documents the presence of coronary artery disease, the severity of  this disease and any potential stenosis cannot be assessed on this  non-gated CT examination. Assessment for potential risk factor  modification, dietary therapy or pharmacologic therapy may be  warranted, if clinically indicated.  CTA ABDOMEN AND PELVIS  Arterial findings:  Aorta: Suprarenal segment is atheromatous and  ectatic up to 3.1 cm diameter. 3.8 x 3.9 cm fusiform somewhat  tortuous aneurysm of the infrarenal segment, tapering to a diameter  of 2 cm at the bifurcation.  Celiac axis:  Patent, unremarkable distal branching  Superior mesenteric:Mild proximal plaque but no significant  stenosis, unremarkable distal branching anatomy  Left renal: Duplicated, inferior dominant, both widely  patent  Right renal: Single, with nonocclusive proximal plaque,  patent distally  Inferior mesenteric:Patent, arising from the aneurysmal segment.  Left iliac: Mildly tortuous without aneurysm or  dissection.  Right iliac: Tortuous and atheromatous without aneurysm  or dissection.  Venous findings: Dedicated venous phase imaging was not obtained.  Review of the MIP images confirms the above findings.  Nonvascular findings: Unremarkable arterial phase evaluation of  liver, nondilated gallbladder, spleen, adrenal glands. 14 mm  probable cyst from the lower pole left kidney. 10 mm possible cyst  in the interpolar region right kidney. No hydronephrosis.  Unremarkable pancreas. Stomach, small bowel, and colon are  nondilated. No ascites. No free air. No adenopathy localized.  Urinary bladder is distended. There is moderate prostatic  enlargement. Spondylitic changes in the lower lumbar spine. No  lytic or sclerotic osseous lesion is identified.  IMPRESSION:  1. No acute abdominal process.  2. 3.9 cm fusiform infrarenal aortic aneurysm.  3. No evidence of metastatic disease.  Original Report Authenticated By: D. Andria Rhein, MD    Impression: He has widely metastatic squamous cell lung carcinoma. He needs a portacath.   Plan: Portacath insertion. I discussed the procedure, alternatives, benefits and risks with the patient via an interpreter and he understands and agrees to proceed.

## 2013-02-24 NOTE — Brief Op Note (Signed)
02/24/2013  9:31 AM  PATIENT:  [name]  72 y.o. male  PRE-OPERATIVE DIAGNOSIS:  lung cancer  POST-OPERATIVE DIAGNOSIS:  Lung Cancer  PROCEDURE:  Procedure(s): INSERTION PORT-A-CATH (Right)Subclavian vein  SURGEON:  Surgeon(s) and Role:    * Alleen Borne, MD - Primary  PHYSICIAN ASSISTANT: none  ASSISTANTS: none   ANESTHESIA:   local and IV sedation  EBL:  Total I/O In: 400 [I.V.:400] Out: -   BLOOD ADMINISTERED:none  DRAINS: none   LOCAL MEDICATIONS USED:  LIDOCAINE   SPECIMEN:  No Specimen  DISPOSITION OF SPECIMEN:  N/A  COUNTS:  YES  PLAN OF CARE: Discharge to home after PACU  PATIENT DISPOSITION:  PACU - hemodynamically stable.   Delay start of Pharmacological VTE agent (>24hrs) due to surgical blood loss or risk of bleeding: not applicable

## 2013-02-24 NOTE — Op Note (Signed)
Cardiothoracic Surgery Operative Note  [name] [id number] 02/24/2013   Preoperative Dx:  Metastatic squamous cell carcinoma of the lung  Procedure:  Insertion of 9.6 F Power Port  Postoperative Dx: Same  Surgeon: Dr. 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 He has a history of ischemic cardiomyopathy with an ejection fraction of 25% and chronic systolic congestive heart failure. He was admitted in April 2014 with an exacerbation of his heart failure and subsequently underwent implantation of a [employer] CRTD by Dr. Ladona Ridgel. At the time of admission he was having pain across the upper chest that is present almost all the time. It is not related to exertion. He had a CT scan the chest on 01/11/2013 which showed an anterior mediastinal soft tissue mass or adenopathy with contiguous extension between the superior vena cava and right brachiocephalic artery to the right paratracheal region. There was cortical destruction in the right manubrium contiguous with the lesion. There is also bilateral hilar adenopathy and a 2.6 cm spiculated opacity in the left lung apex. There is a more smoothly marginated 8 x 15 mm nodule in the posterior basal segment of left lower lobe. PET scan showed a large anterior mediastinal mass with intense FDG uptake as well as hypermetabolic lymph node mets to the right supraclavicular region, mediastinum, and bilateral hilar areas. There are multiple hypermetabolic liver mets. There is hypermetabolic activity in the proximal gastric wall and bone mets to the left proximal femur, sternum and sacrum. On 02/02/2013 the patient underwent ultrasound-guided right supraclavicular lymph node biopsy by interventional radiology. The final  pathology (Accession: [id number]) showed metastatic poorly differentiated carcinoma, consistent with squamous cell carcinoma. The malignant cells are positive for cytokeratin 5/6 and p63. They are negative for CD56, cytokeratin 20, cytokeratin 7, Napsin-A, synaptophysin and TTF-1. 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  He is currently undergoing palliative radiotherapy to the large anterior mediastinal mass under the care of Dr. Kathrynn Running and this is expected to be completed on 03/02/2013. He needs a portacath for chemotherapy. I discussed the procedure, alternatives, benefits and risks with the patient via an interpreter and he understands and agrees to proceed.   Operative procedure:  The patient was seen in the preoperative holding area and the correct patient, correct operation were confirmed after reviewing the chart. The consent was signed by me. Preoperative antibiotics were given. The patient was taken back to the operating room and placed on the table in the supine position. After adequate IV sedation the neck and chest were prepped with betadine soap and solution and draped in the usual sterile manner. A time out was taken and the correct patient, correct operation, were confirmed with nursing and anesthesia staff. 1% lidocaine local anesthesia was infiltrated in the skin and subcutaneous tissue in the right infraclavicular region. A 3 cm transverse incision was made and carried down through the subcutaneous tissue with electrocautery. A pocket was developed below the incision for the port. The catheter was cut to 18 cm and flushed with heparin saline. The right subclavian vein was cannulated with a needle and a guidewire advanced into the right side of the heart using fluoroscopic guidance. A 10 F introducer and sheath was inserted over the guidwire using  fluoroscopic guidance. The introducer was removed and the catheter was inserted through the sheath into the SVC. The  sheath was removed. The port was placed into the pocket and anchored to the pectoralis fascia using 2-0 silk sutures. The port was flushed with 3 cc of heparin solution ( 1000 units/cc). The subcutaneous tissue was closed with continuous 3-0 vicryl suture. The skin was closed with 4-0 vicryl subcuticular suture. Dermabond was applied over the incision. The sponge, needle, and instrument counts were correct according to the nurses. The patient was then transferred to the PACU in stable condition.   j

## 2013-02-24 NOTE — Transfer of Care (Signed)
Immediate Anesthesia Transfer of Care Note  Patient: [name]  Procedure(s) Performed: Procedure(s): INSERTION PORT-A-CATH (Right)  Patient Location: PACU  Anesthesia Type:MAC  Level of Consciousness: awake and patient cooperative  Airway & Oxygen Therapy: Patient Spontanous Breathing and Patient connected to face mask oxygen  Post-op Assessment: Report given to PACU RN, Post -op Vital signs reviewed and stable and Patient moving all extremities X 4  Post vital signs: Reviewed and stable  Complications: No apparent anesthesia complications

## 2013-02-24 NOTE — Anesthesia Postprocedure Evaluation (Signed)
  Anesthesia Post-op Note  Patient: [name]  Procedure(s) Performed: Procedure(s): INSERTION PORT-A-CATH (Right)  Patient Location: PACU  Anesthesia Type:MAC  Level of Consciousness: awake, alert  and oriented  Airway and Oxygen Therapy: Patient Spontanous Breathing and Patient connected to nasal cannula oxygen  Post-op Pain: none  Post-op Assessment: Post-op Vital signs reviewed, PATIENT'S CARDIOVASCULAR STATUS UNSTABLE, Respiratory Function Stable, Patent Airway and Pain level controlled  Post-op Vital Signs: stable  Complications: No apparent anesthesia complications

## 2013-02-25 ENCOUNTER — Encounter (HOSPITAL_COMMUNITY): Payer: Self-pay | Admitting: Surgery

## 2013-02-25 ENCOUNTER — Ambulatory Visit
Admission: RE | Admit: 2013-02-25 | Discharge: 2013-02-25 | Disposition: A | Discharge status: Home / Self Care | Payer: Medicare PPO | Source: Ambulatory Visit | Attending: Radiation Oncology | Admitting: Radiation Oncology

## 2013-02-26 ENCOUNTER — Inpatient Hospital Stay
Admission: RE | Admit: 2013-02-26 | Discharge: 2013-02-26 | Disposition: A | Discharge status: Home / Self Care | Payer: Medicare PPO | Source: Ambulatory Visit | Attending: Radiation Oncology | Admitting: Radiation Oncology

## 2013-02-26 ENCOUNTER — Ambulatory Visit
Admission: RE | Admit: 2013-02-26 | Discharge: 2013-02-26 | Disposition: A | Discharge status: Home / Self Care | Payer: Medicare PPO | Source: Ambulatory Visit | Attending: Radiation Oncology | Admitting: Radiation Oncology

## 2013-02-26 ENCOUNTER — Encounter: Payer: Self-pay | Admitting: Radiation Oncology

## 2013-02-26 VITALS — BP 119/78 | HR 72 | Resp 16 | Wt 111.0 lb

## 2013-02-26 DIAGNOSIS — C771 Secondary and unspecified malignant neoplasm of intrathoracic lymph nodes: Secondary | ICD-10-CM

## 2013-02-26 DIAGNOSIS — J9859 Other diseases of mediastinum, not elsewhere classified: Secondary | ICD-10-CM

## 2013-02-26 NOTE — Progress Notes (Signed)
  Radiation Oncology         [phone] ________________________________  Name: [name] MRN: [id number]  Date: 02/26/2013  DOB: [birth year]  Weekly Radiation Therapy Management  Current Dose: 27.5 Gy     Planned Dose:  35 Gy  Narrative . . . . . . . . The patient presents for routine under treatment assessment.                                                      The patient reports that his sternal pain is improved                                 Set-up films were reviewed.                                 The chart was checked. Physical Findings. . .  weight is 111 lb (50.349 kg). His blood pressure is 119/78 and his pulse is 72. His respiration is 16. . Weight essentially stable.  No significant changes. Impression . . . . . . . The patient is  tolerating radiation. Plan . . . . . . . . . . . . Continue treatment as planned. I gave him a prescription for wheelchair  ________________________________  Artist Pais. Kathrynn Running, M.D.

## 2013-02-26 NOTE — Progress Notes (Signed)
Patient reports fatigue today. Reports porta cath was placed on Wednesday. Reports pain right upper chest/porta cath surgical site and back 3 on a scale of 0-10. Reports taking percocet 5/325 mg for pain. Reports pain worse at night reporting its a 5 on a scale of 0-10. Reports decreased appetite. Patient has lost three pounds since PUT last week. Will contact Zenovia Jarred for nutrition consult. Patient reports decreased appetite and request medication to improve it. Reports a dry cough. Denies painful swallowing. Reports shortness of breath with exertion. Requesting script for wheelchair because he becomes dizziness when trying to walk into church. Reports he sleeps approximately eight hours each night without difficulty. Reports having a bowel movement yesterday.

## 2013-03-01 ENCOUNTER — Ambulatory Visit
Admission: RE | Admit: 2013-03-01 | Discharge: 2013-03-01 | Disposition: A | Discharge status: Home / Self Care | Payer: Medicare PPO | Source: Ambulatory Visit | Attending: Radiation Oncology | Admitting: Radiation Oncology

## 2013-03-01 ENCOUNTER — Telehealth: Payer: Self-pay | Admitting: *Deleted

## 2013-03-01 NOTE — Telephone Encounter (Signed)
CALLED PATIENT'S DAUGHTER TO INFORM OF NUTRITION APPT. FOR HER DAD ON 03-02-13 AT 10:30 AM, LVM FOR A RETURN CALL

## 2013-03-02 ENCOUNTER — Ambulatory Visit
Admission: RE | Admit: 2013-03-02 | Discharge: 2013-03-02 | Disposition: A | Discharge status: Home / Self Care | Payer: Medicare PPO | Source: Ambulatory Visit | Attending: Radiation Oncology | Admitting: Radiation Oncology

## 2013-03-02 ENCOUNTER — Ambulatory Visit: Payer: Medicare PPO | Admitting: Nutrition

## 2013-03-02 NOTE — Progress Notes (Signed)
This patient is a 72 year old male diagnosed with lung cancer.  He is a patient of Dr. Shirline Frees.  Past medical history includes hypertension, cardiomyopathy, COPD, MI, sleep apnea, stroke, and pacemaker.  Medications include Lasix, and Crestor.  Labs include BUN 51, creatinine 2.51, and albumin 3.4 on August 6.  Height: 67 inches. Weight: 111 pounds. Usual body weight 110 pounds in April 2014; 120 pounds August 2013. BMI: 17.38.  I met with patient, family member, and interpreter.  Patient reports absolutely no appetite.  He has experienced weight loss from usual body weight.  He is consuming rice, and fruits or fruit juices.  He used to consume fish, but now eats very little.  He does drink soy milk and "Asian milk".  Nutrition diagnosis: Unintended weight loss related to decreased appetite as evidenced by 7% weight loss from usual body weight of 120 pounds, BMI of 17.38, and dietary recall revealing inadequate oral intake.  Intervention: Patient was educated to increase oral intake in bites and sips throughout the day.  I've educated patient and family member on appropriate food choices.  I've encouraged patient to consume an oral nutrition supplement such as Ensure Plus or boost plus.  I provided samples of these products for him to try.  I've also encouraged him to add Carnation breakfast essentials to his soy milk.  Fact sheets were provided and teach back method used.  Questions were answered.  Monitoring, evaluation, goals: Patient will tolerate adequate calories and protein to minimize further weight loss throughout treatment.  Next visit: Wednesday, August 20, during chemotherapy.

## 2013-03-03 ENCOUNTER — Ambulatory Visit: Admission: RE | Admit: 2013-03-03 | Payer: Medicare PPO | Source: Ambulatory Visit | Admitting: Radiation Oncology

## 2013-03-03 ENCOUNTER — Other Ambulatory Visit: Payer: Medicare PPO

## 2013-03-03 ENCOUNTER — Encounter: Payer: Self-pay | Admitting: *Deleted

## 2013-03-03 ENCOUNTER — Ambulatory Visit
Admission: RE | Admit: 2013-03-03 | Discharge: 2013-03-03 | Disposition: A | Discharge status: Home / Self Care | Payer: Medicare PPO | Source: Ambulatory Visit | Attending: Radiation Oncology | Admitting: Radiation Oncology

## 2013-03-03 ENCOUNTER — Encounter: Payer: Self-pay | Admitting: Radiation Oncology

## 2013-03-03 NOTE — Progress Notes (Signed)
[name] here for weekly under treat visit.  He has had 14 fractions to his chest.  He is having pain in the area of his port-a-cath that he is rating at a 5/10.  The incision scar is red over the port-a-cath.  He also is reporting a sore throat with swallowing.  He said this started two days ago.  On inspection, his mouth is intact..  He does have a cough.  He does have shortness of breath with activity.  He does have fatigue.

## 2013-03-03 NOTE — Progress Notes (Signed)
   Department of Radiation Oncology  Phone:  [phone] Fax:        [phone]  Weekly Treatment Note    Name: [name] Date: 03/03/2013 MRN: [id number] DOB: 1941-04-28   Current dose: 35 Gy  Current fraction: 14   MEDICATIONS: Current Outpatient Prescriptions  Medication Sig Dispense Refill  . carvedilol (COREG) 6.25 MG tablet Take 6.25 mg by mouth 2 (two) times daily with a meal.      . furosemide (LASIX) 40 MG tablet Take 40 mg by mouth 2 (two) times daily.      Marland Kitchen ibuprofen (ADVIL,MOTRIN) 400 MG tablet Take 400 mg by mouth every 6 (six) hours as needed for pain.      . isosorbide dinitrate (ISORDIL) 10 MG tablet       . ramipril (ALTACE) 10 MG capsule Take 1 capsule (10 mg total) by mouth daily.  30 capsule  5  . rosuvastatin (CRESTOR) 20 MG tablet Take 1 tablet (20 mg total) by mouth daily.  30 tablet  6  . Wound Cleansers (RADIAPLEX EX) Apply topically. Apply to chest and back bid but, not four hours prior to radiation treatment      . HYDROcodone-acetaminophen (NORCO/VICODIN) 5-325 MG per tablet       . lidocaine-prilocaine (EMLA) cream Apply topically as needed.  30 g  0  . nitroGLYCERIN (NITROSTAT) 0.4 MG SL tablet Place 1 tablet (0.4 mg total) under the tongue every 5 (five) minutes x 3 doses as needed for chest pain.  25 tablet  4  . oxyCODONE-acetaminophen (PERCOCET/ROXICET) 5-325 MG per tablet Take 1 tablet by mouth every 4 (four) hours as needed for pain.  30 tablet  0   No current facility-administered medications for this encounter.     ALLERGIES: Review of patient's allergies indicates no known allergies.   LABORATORY DATA:  Lab Results  Component Value Date   WBC 5.7 02/24/2013   HGB 9.7* 02/24/2013   HCT 29.6* 02/24/2013   MCV 87.3 02/24/2013   PLT 251 02/24/2013   Lab Results  Component Value Date   NA 137 02/24/2013   K 3.9 02/24/2013   CL 94* 02/24/2013   CO2 29 02/24/2013   Lab Results  Component Value Date   ALT 16 02/24/2013   AST 27 02/24/2013   ALKPHOS 91 02/24/2013   BILITOT 0.4 02/24/2013     NARRATIVE: [name] was seen today for weekly treatment management. The chart was checked and the patient's films were reviewed. The patient finished his final fraction today. He is complaining of a sore throat/esophagitis. This has occurred over the last couple of days. Otherwise no new complaints.  PHYSICAL EXAMINATION: height is 5\' 7"  (1.702 m) and weight is 111 lb 4.8 oz (50.485 kg). His temperature is 97.5 F (36.4 C). His blood pressure is 144/92 and his pulse is 74. His oxygen saturation is 100%.        ASSESSMENT: The patient did satisfactorily with treatment.  PLAN: I discussed the possibility of medication for esophagitis. The patient finished his final fraction today and wants to see how this improves over the next few days to one week. The patient will let us know if he feels that he needs anything called in. Followup with Dr. Kathrynn Running one month.

## 2013-03-04 NOTE — Progress Notes (Signed)
  Radiation Oncology         [phone] ________________________________  Name: [name] MRN: [id number]  Date: 03/03/2013  DOB: 05-Jan-1941  End of Treatment Note  Diagnosis:   72 year old gentleman with squamous cell carcinoma of the lung with painful manubrial involvement  Indication for treatment:  Palliation of pain       Radiation treatment dates:   02/11/2013-03/03/2013  Site/dose:   The anterior chest mass was treated to 35 gray in 14 fractions of 2.5 gray  Beams/energy:   Anterior and posterior 6 megavolt photon fields were applied according to isodose plan. The fields were shaped to cover the mass anterior chest with sternal invasion while shielding tissues the lungs heart and spinal cord from radiation around the edges. In addition, the patient's AICD was shielded from the radiation.  Narrative: The patient tolerated radiation treatment relatively well.   He experienced some esophagitis during radiation.  Plan: The patient has completed radiation treatment. The patient will return to radiation oncology clinic for routine followup in one month. I advised them to call or return sooner if they have any questions or concerns related to their recovery or treatment. ________________________________  Artist Pais. Kathrynn Running, M.D.

## 2013-03-10 ENCOUNTER — Ambulatory Visit (HOSPITAL_BASED_OUTPATIENT_CLINIC_OR_DEPARTMENT_OTHER): Payer: Medicare PPO | Admitting: Internal Medicine

## 2013-03-10 ENCOUNTER — Ambulatory Visit: Payer: Medicare PPO | Admitting: Nutrition

## 2013-03-10 ENCOUNTER — Ambulatory Visit (HOSPITAL_BASED_OUTPATIENT_CLINIC_OR_DEPARTMENT_OTHER): Payer: Medicare PPO

## 2013-03-10 ENCOUNTER — Encounter: Payer: Self-pay | Admitting: Internal Medicine

## 2013-03-10 ENCOUNTER — Telehealth: Payer: Self-pay | Admitting: Internal Medicine

## 2013-03-10 ENCOUNTER — Other Ambulatory Visit (HOSPITAL_BASED_OUTPATIENT_CLINIC_OR_DEPARTMENT_OTHER): Payer: Medicare PPO | Admitting: Lab

## 2013-03-10 VITALS — BP 102/59 | HR 91 | Temp 97.9°F | Resp 20 | Ht 67.0 in | Wt 108.0 lb

## 2013-03-10 VITALS — BP 154/85 | HR 77 | Temp 97.9°F | Resp 18

## 2013-03-10 DIAGNOSIS — R5381 Other malaise: Secondary | ICD-10-CM

## 2013-03-10 DIAGNOSIS — C771 Secondary and unspecified malignant neoplasm of intrathoracic lymph nodes: Secondary | ICD-10-CM

## 2013-03-10 DIAGNOSIS — C3490 Malignant neoplasm of unspecified part of unspecified bronchus or lung: Secondary | ICD-10-CM

## 2013-03-10 DIAGNOSIS — F329 Major depressive disorder, single episode, unspecified: Secondary | ICD-10-CM

## 2013-03-10 DIAGNOSIS — C787 Secondary malignant neoplasm of liver and intrahepatic bile duct: Secondary | ICD-10-CM

## 2013-03-10 DIAGNOSIS — Z5111 Encounter for antineoplastic chemotherapy: Secondary | ICD-10-CM

## 2013-03-10 DIAGNOSIS — C7951 Secondary malignant neoplasm of bone: Secondary | ICD-10-CM

## 2013-03-10 DIAGNOSIS — R634 Abnormal weight loss: Secondary | ICD-10-CM

## 2013-03-10 LAB — COMPREHENSIVE METABOLIC PANEL (CC13)
ALT: 21 U/L (ref 0–55)
AST: 26 U/L (ref 5–34)
Albumin: 3.1 g/dL — ABNORMAL LOW (ref 3.5–5.0)
Alkaline Phosphatase: 84 U/L (ref 40–150)
Potassium: 4 mEq/L (ref 3.5–5.1)
Sodium: 143 mEq/L (ref 136–145)
Total Bilirubin: 0.33 mg/dL (ref 0.20–1.20)
Total Protein: 8.5 g/dL — ABNORMAL HIGH (ref 6.4–8.3)

## 2013-03-10 LAB — CBC WITH DIFFERENTIAL/PLATELET
BASO%: 0.6 % (ref 0.0–2.0)
LYMPH%: 6.7 % — ABNORMAL LOW (ref 14.0–49.0)
MCHC: 33.4 g/dL (ref 32.0–36.0)
MCV: 87.3 fL (ref 79.3–98.0)
MONO%: 9.2 % (ref 0.0–14.0)
NEUT#: 3.4 10*3/uL (ref 1.5–6.5)
Platelets: 201 10*3/uL (ref 140–400)
RBC: 3.13 10*6/uL — ABNORMAL LOW (ref 4.20–5.82)
RDW: 14.4 % (ref 11.0–14.6)
WBC: 4.2 10*3/uL (ref 4.0–10.3)

## 2013-03-10 MED ORDER — HEPARIN SOD (PORK) LOCK FLUSH 100 UNIT/ML IV SOLN
500.0000 [IU] | Freq: Once | INTRAVENOUS | Status: AC | PRN
  Administered 2013-03-10: 500 [IU]
  Filled 2013-03-10: qty 5

## 2013-03-10 MED ORDER — METHYLPREDNISOLONE (PAK) 4 MG PO TABS: ORAL_TABLET | ORAL | Status: DC

## 2013-03-10 MED ORDER — DEXAMETHASONE SODIUM PHOSPHATE 20 MG/5ML IJ SOLN
20.0000 mg | Freq: Once | INTRAMUSCULAR | Status: AC
  Administered 2013-03-10: 20 mg via INTRAVENOUS

## 2013-03-10 MED ORDER — PROCHLORPERAZINE MALEATE 10 MG PO TABS: 10.0000 mg | ORAL_TABLET | Freq: Four times a day (QID) | ORAL | Status: DC | PRN

## 2013-03-10 MED ORDER — PACLITAXEL CHEMO INJECTION 300 MG/50ML
175.0000 mg/m2 | Freq: Once | INTRAVENOUS | Status: AC
  Administered 2013-03-10: 276 mg via INTRAVENOUS
  Filled 2013-03-10: qty 46

## 2013-03-10 MED ORDER — MIRTAZAPINE 15 MG PO TABS: 15.0000 mg | ORAL_TABLET | Freq: Every day | ORAL | Status: DC

## 2013-03-10 MED ORDER — SODIUM CHLORIDE 0.9 % IV SOLN
Freq: Once | INTRAVENOUS | Status: AC
  Administered 2013-03-10: 12:00:00 via INTRAVENOUS

## 2013-03-10 MED ORDER — FAMOTIDINE IN NACL 20-0.9 MG/50ML-% IV SOLN
20.0000 mg | Freq: Once | INTRAVENOUS | Status: AC
  Administered 2013-03-10: 20 mg via INTRAVENOUS

## 2013-03-10 MED ORDER — SODIUM CHLORIDE 0.9 % IJ SOLN
10.0000 mL | INTRAMUSCULAR | Status: DC | PRN
  Administered 2013-03-10: 10 mL
  Filled 2013-03-10: qty 10

## 2013-03-10 MED ORDER — ONDANSETRON 16 MG/50ML IVPB (CHCC)
16.0000 mg | Freq: Once | INTRAVENOUS | Status: AC
  Administered 2013-03-10: 16 mg via INTRAVENOUS

## 2013-03-10 MED ORDER — SODIUM CHLORIDE 0.9 % IV SOLN
246.0000 mg | Freq: Once | INTRAVENOUS | Status: AC
  Administered 2013-03-10: 250 mg via INTRAVENOUS
  Filled 2013-03-10: qty 25

## 2013-03-10 MED ORDER — DIPHENHYDRAMINE HCL 50 MG/ML IJ SOLN
50.0000 mg | Freq: Once | INTRAMUSCULAR | Status: AC
  Administered 2013-03-10: 50 mg via INTRAVENOUS

## 2013-03-10 NOTE — Telephone Encounter (Signed)
Gave pt appt for lab, MD and chemo for August and September 2014 °

## 2013-03-10 NOTE — Progress Notes (Signed)
Patient seen in chemotherapy room with interpreter. Patient reports decreased oral intake resulting in continued weight loss.  His appetite has not improved and he cannot force himself to eat.  Noted to have esophagitis last week.  Weight documented as 108 pounds on 03/10/13.  Patient denies nutrition needs at this time.  Nutrition Diagnosis:  Unintended weight loss continues.  Intervention:  Patient educated to attempt increase oral intake including oral supplements and soft foods.  Patient verbalizes he will try.  Monitoring, evaluation, goals:  Patient unable to increase oral intake for weight stabilization.  Next visit: Wednesday, Sept 10 during chemotherapy.

## 2013-03-10 NOTE — Progress Notes (Signed)
[facility] [phone]   [phone]  OFFICE PROGRESS NOTE  Runell Gess, MD [address] [place] [address]  DIAGNOSIS: Metastatic non-small cell lung cancer, squamous cell carcinoma with a large anterior mediastinal mass as well as bilateral lymphadenopathy and liver as well as bone metastases diagnosed in June of 2014  PRIOR THERAPY: Palliative radiotherapy to the manubrium under the care of Dr. Kathrynn Running.  CURRENT THERAPY: Systemic chemotherapy with carboplatin for AUC of 5 and paclitaxel 175 mg/M2 every 3 weeks with Neulasta support. First cycle expected on 03/10/2013.  CHEMOTHERAPY INTENT: Palliative  CURRENT # OF CHEMOTHERAPY CYCLES: 1  CURRENT ANTIEMETICS: Zofran, dexamethasone and Compazine  CURRENT SMOKING STATUS: Former smoker quit 03/13/2009  ORAL CHEMOTHERAPY AND CONSENT: None  CURRENT BISPHOSPHONATES USE: None  PAIN MANAGEMENT: 0/10  NARCOTICS INDUCED CONSTIPATION: None  LIVING WILL AND CODE STATUS: Full code.   INTERVAL HISTORY: [name] 72 y.o. male returns to the clinic today for followup visit accompanied by his daughter and his [place] interpreter. The patient continues to complain of increasing fatigue and weakness as well as lack of appetite and weight loss. He also has some itching at the radiation port on the back. He denied having any significant chest pain but has shortness breath with exertion and no cough or hemoptysis. The patient denied having any nausea or vomiting. He is here today to start the first cycle of his systemic chemotherapy with carboplatin and paclitaxel.  MEDICAL HISTORY: Past Medical History  Diagnosis Date  . Hypertension   . Ischemic cardiomyopathy Sept 2011    EF improved to 35-40% 2D  . COPD (chronic obstructive pulmonary disease)   . LBBB (left bundle branch block)   . Acute anterior myocardial infarction 2011    s/p PCI to LAD; 2.25 x 12 mm BMS  . ICD  (implantable cardiac defibrillator) in place   . Anginal pain   . Sleep apnea     "while sleeping last week" (11/12/2012)  . Stroke 2009    "sometimes left side goes numb/weak/gives out" (11/12/2012)  . Pacemaker   . Shortness of breath     Hx: of with exertion   . Headache(784.0)   . Cancer     Lung cancer    ALLERGIES:  has No Known Allergies.  MEDICATIONS:  Current Outpatient Prescriptions  Medication Sig Dispense Refill  . carvedilol (COREG) 6.25 MG tablet Take 6.25 mg by mouth 2 (two) times daily with a meal.      . furosemide (LASIX) 40 MG tablet Take 40 mg by mouth 2 (two) times daily.      Marland Kitchen ibuprofen (ADVIL,MOTRIN) 400 MG tablet Take 400 mg by mouth every 6 (six) hours as needed for pain.      . isosorbide dinitrate (ISORDIL) 10 MG tablet       . lidocaine-prilocaine (EMLA) cream Apply topically as needed.  30 g  0  . ramipril (ALTACE) 10 MG capsule Take 1 capsule (10 mg total) by mouth daily.  30 capsule  5   No current facility-administered medications for this visit.    SURGICAL HISTORY:  Past Surgical History  Procedure Laterality Date  . Bi-ventricular implantable cardioverter defibrillator  (crt-d)  11/12/2012  . Coronary angioplasty with stent placement  02/12/2010    LAD BMS  . Insert / replace / remove pacemaker    . Portacath placement Right 02/24/2013    Procedure: INSERTION PORT-A-CATH;  Surgeon: Alleen Borne, MD;  Location: [facility] OR;  Service: Thoracic;  Laterality: Right;    REVIEW OF SYSTEMS:  A comprehensive review of systems was negative except for: Constitutional: positive for anorexia, fatigue and weight loss Respiratory: positive for dyspnea on exertion Integument/breast: positive for pruritus   PHYSICAL EXAMINATION: General appearance: alert, cooperative, fatigued and no distress Head: Normocephalic, without obvious abnormality, atraumatic Neck: no adenopathy Lymph nodes: Cervical, supraclavicular, and axillary nodes normal. Resp: clear to  auscultation bilaterally Cardio: regular rate and rhythm, S1, S2 normal, no murmur, click, rub or gallop GI: soft, non-tender; bowel sounds normal; no masses,  no organomegaly Extremities: extremities normal, atraumatic, no cyanosis or edema Neurologic: Alert and oriented X 3, normal strength and tone. Normal symmetric reflexes. Normal coordination and gait  ECOG PERFORMANCE STATUS: 1 - Symptomatic but completely ambulatory  Blood pressure 102/59, pulse 91, temperature 97.9 F (36.6 C), temperature source Oral, resp. rate 20, height 5\' 7"  (1.702 m), weight 108 lb (48.988 kg).  LABORATORY DATA: Lab Results  Component Value Date   WBC 4.2 03/10/2013   HGB 9.1* 03/10/2013   HCT 27.3* 03/10/2013   MCV 87.3 03/10/2013   PLT 201 03/10/2013      Chemistry      Component Value Date/Time   NA 143 03/10/2013 0932   NA 137 02/24/2013 0716   K 4.0 03/10/2013 0932   K 3.9 02/24/2013 0716   CL 94* 02/24/2013 0716   CO2 28 03/10/2013 0932   CO2 29 02/24/2013 0716   BUN 32.9* 03/10/2013 0932   BUN 51* 02/24/2013 0716   CREATININE 2.0* 03/10/2013 0932   CREATININE 2.51* 02/24/2013 0716      Component Value Date/Time   CALCIUM 9.4 03/10/2013 0932   CALCIUM 9.9 02/24/2013 0716   ALKPHOS 84 03/10/2013 0932   ALKPHOS 91 02/24/2013 0716   AST 26 03/10/2013 0932   AST 27 02/24/2013 0716   ALT 21 03/10/2013 0932   ALT 16 02/24/2013 0716   BILITOT 0.33 03/10/2013 0932   BILITOT 0.4 02/24/2013 0716       RADIOGRAPHIC STUDIES: Dg Chest 2 View Within Previous 72 Hours.  Films Obtained On Friday Are Acceptable For Monday And Tuesday Cases  02/24/2013   *RADIOLOGY REPORT*  Clinical Data: Preoperative evaluation for surgery.  Shortness of breath.  Cough.  CHEST - 2 VIEW  Comparison: Chest x-ray 01/11/2013.  Findings: Lung volumes are normal.  No acute consolidative airspace disease.  Small calcified granuloma in the periphery of the right upper lobe, and extensive left apical nodular pleural parenchymal thickening most compatible  with scarring, unchanged.  No other definite suspicious-appearing pulmonary nodules or masses are identified on today's plain film examination.  No acute consolidative airspace disease.  No pleural effusions.  No evidence of pulmonary edema.  Heart size is normal.  Mediastinal contours are unremarkable.  Atherosclerosis in the thoracic aorta.  Left- sided biventricular pacemaker / AICD with lead tips projecting over the expected location of the right atrial appendage, right ventricular apex and overlying the lateral wall of the left ventricle via the coronary sinus and coronary veins.  IMPRESSION: 1.  No radiographic evidence of acute cardiopulmonary disease.  The appearance of chest is unchanged, as above. 2.  Known mediastinal and hilar adenopathy is not well demonstrated on today's plain film examination.  See PET-CT 01/19/2013 for full details.   Original Report Authenticated By: Trudie Reed, M.D.   Ct Head Wo Contrast  02/22/2013   *RADIOLOGY REPORT*  Clinical Data: Non-small cell lung cancer  CT HEAD WITHOUT CONTRAST  Technique:  Contiguous axial images were obtained from the base of the skull through the vertex without contrast.  Comparison: CT head 10/10 facet  Findings: Creatinine  2.1.  Intravenous contrast not administered.  Mild atrophy.  Chronic infarct right thalamus related to hemorrhage on the prior study.  Chronic microvascular ischemic change in the white matter.  Negative for acute infarct.  Negative for acute hemorrhage or mass lesion.  Detection of metastatic disease is diminished without intravenous contrast.  IMPRESSION: Atrophy and chronic microvascular ischemic change.  No acute abnormality.  Negative for metastatic disease on unenhanced images.   Original Report Authenticated By: Janeece Riggers, M.D.   Dg Chest Port 1 View  02/24/2013   *RADIOLOGY REPORT*  Clinical Data: Right subclavian Port-A-Cath insertion of the  PORTABLE CHEST - 1 VIEW  Comparison: Chest x-ray of 02/24/2013   Findings: A right-sided Port-A-Cath is now present with the tip overlying the lower SVC a few centimeters above the expected SVC - RA junction.  No pneumothorax is seen.  Cardiomegaly is stable and a defibrillator remains.  IMPRESSION: Right-sided Port-A-Cath tip overlies the lower SVC.  No pneumothorax.   Original Report Authenticated By: Dwyane Dee, M.D.   Dg Fluoro Guide Cv Line-no Report  02/24/2013   CLINICAL DATA: surgery   FLOURO GUIDE CV LINE  Fluoroscopy was utilized by the requesting physician.  No radiographic  interpretation.     ASSESSMENT AND PLAN: This is a very pleasant 72 years old [place] male with history of metastatic non-small cell lung cancer, squamous cell carcinoma status post palliative radiotherapy to the manubrium.  The patient is here today to start the first cycle of his systemic chemotherapy with carboplatin and paclitaxel. He is doing fine today and recommended for him to proceed with the first cycle today as scheduled. For the lack of appetite and depression, I will start the patient on Remeron 15 mg by mouth each bedtime. The patient was also seen by the dietitian at the cancer Center and was advised about height nutritional supplement. He would come back for followup visit in one week for reevaluation and management any adverse effect of his chemotherapy. He was advised to call immediately if he has any concerning symptoms in the interval. The patient voices understanding of current disease status and treatment options and is in agreement with the current care plan.  All questions were answered. The patient knows to call the clinic with any problems, questions or concerns. We can certainly see the patient much sooner if necessary.  I spent 15 minutes counseling the patient face to face. The total time spent in the appointment was 25 minutes.

## 2013-03-10 NOTE — Patient Instructions (Signed)
Cancer Center Discharge Instructions for Patients Receiving Chemotherapy  Today you received the following chemotherapy agents Taxol/Carboplatin To help prevent nausea and vomiting after your treatment, we encourage you to take your nausea medication as prescribed.  If you develop nausea and vomiting that is not controlled by your nausea medication, call the clinic.   BELOW ARE SYMPTOMS THAT SHOULD BE REPORTED IMMEDIATELY:  *FEVER GREATER THAN 100.5 F  *CHILLS WITH OR WITHOUT FEVER  NAUSEA AND VOMITING THAT IS NOT CONTROLLED WITH YOUR NAUSEA MEDICATION  *UNUSUAL SHORTNESS OF BREATH  *UNUSUAL BRUISING OR BLEEDING  TENDERNESS IN MOUTH AND THROAT WITH OR WITHOUT PRESENCE OF ULCERS  *URINARY PROBLEMS  *BOWEL PROBLEMS  UNUSUAL RASH Items with * indicate a potential emergency and should be followed up as soon as possible.  Feel free to call the clinic you have any questions or concerns. The clinic phone number is [phone].     Paclitaxel injection (Taxol) What is this medicine? PACLITAXEL (PAK li TAX el) is a chemotherapy drug. It targets fast dividing cells, like cancer cells, and causes these cells to die. This medicine is used to treat ovarian cancer, breast cancer, and other cancers. This medicine may be used for other purposes; ask your health care provider or pharmacist if you have questions. What should I tell my health care provider before I take this medicine? They need to know if you have any of these conditions: -blood disorders -irregular heartbeat -infection (especially a virus infection such as chickenpox, cold sores, or herpes) -liver disease -previous or ongoing radiation therapy -an unusual or allergic reaction to paclitaxel, alcohol, polyoxyethylated castor oil, other chemotherapy agents, other medicines, foods, dyes, or preservatives -pregnant or trying to get pregnant -breast-feeding How should I use this medicine? This drug is given as  an infusion into a vein. It is administered in a hospital or clinic by a specially trained health care professional. Talk to your pediatrician regarding the use of this medicine in children. Special care may be needed. Overdosage: If you think you have taken too much of this medicine contact a poison control center or emergency room at once. NOTE: This medicine is only for you. Do not share this medicine with others. What if I miss a dose? It is important not to miss your dose. Call your doctor or health care professional if you are unable to keep an appointment. What may interact with this medicine? Do not take this medicine with any of the following medications: -disulfiram -metronidazole This medicine may also interact with the following medications: -cyclosporine -dexamethasone -diazepam -ketoconazole -medicines to increase blood counts like filgrastim, pegfilgrastim, sargramostim -other chemotherapy drugs like cisplatin, doxorubicin, epirubicin, etoposide, teniposide, vincristine -quinidine -testosterone -vaccines -verapamil Talk to your doctor or health care professional before taking any of these medicines: -acetaminophen -aspirin -ibuprofen -ketoprofen -naproxen This list may not describe all possible interactions. Give your health care provider a list of all the medicines, herbs, non-prescription drugs, or dietary supplements you use. Also tell them if you smoke, drink alcohol, or use illegal drugs. Some items may interact with your medicine. What should I watch for while using this medicine? Your condition will be monitored carefully while you are receiving this medicine. You will need important blood work done while you are taking this medicine. This drug may make you feel generally unwell. This is not uncommon, as chemotherapy can affect healthy cells as well as cancer cells. Report any side effects. Continue your course of treatment even though you  feel ill unless your  doctor tells you to stop. In some cases, you may be given additional medicines to help with side effects. Follow all directions for their use. Call your doctor or health care professional for advice if you get a fever, chills or sore throat, or other symptoms of a cold or flu. Do not treat yourself. This drug decreases your body's ability to fight infections. Try to avoid being around people who are sick. This medicine may increase your risk to bruise or bleed. Call your doctor or health care professional if you notice any unusual bleeding. Be careful brushing and flossing your teeth or using a toothpick because you may get an infection or bleed more easily. If you have any dental work done, tell your dentist you are receiving this medicine. Avoid taking products that contain aspirin, acetaminophen, ibuprofen, naproxen, or ketoprofen unless instructed by your doctor. These medicines may hide a fever. Do not become pregnant while taking this medicine. Women should inform their doctor if they wish to become pregnant or think they might be pregnant. There is a potential for serious side effects to an unborn child. Talk to your health care professional or pharmacist for more information. Do not breast-feed an infant while taking this medicine. Men are advised not to father a child while receiving this medicine. What side effects may I notice from receiving this medicine? Side effects that you should report to your doctor or health care professional as soon as possible: -allergic reactions like skin rash, itching or hives, swelling of the face, lips, or tongue -low blood counts - This drug may decrease the number of white blood cells, red blood cells and platelets. You may be at increased risk for infections and bleeding. -signs of infection - fever or chills, cough, sore throat, pain or difficulty passing urine -signs of decreased platelets or bleeding - bruising, pinpoint red spots on the skin, black,  tarry stools, nosebleeds -signs of decreased red blood cells - unusually weak or tired, fainting spells, lightheadedness -breathing problems -chest pain -high or low blood pressure -mouth sores -nausea and vomiting -pain, swelling, redness or irritation at the injection site -pain, tingling, numbness in the hands or feet -slow or irregular heartbeat -swelling of the ankle, feet, hands Side effects that usually do not require medical attention (report to your doctor or health care professional if they continue or are bothersome): -bone pain -complete hair loss including hair on your head, underarms, pubic hair, eyebrows, and eyelashes -changes in the color of fingernails -diarrhea -loosening of the fingernails -loss of appetite -muscle or joint pain -red flush to skin -sweating This list may not describe all possible side effects. Call your doctor for medical advice about side effects. You may report side effects to FDA at 1-800-FDA-1088. Where should I keep my medicine? This drug is given in a hospital or clinic and will not be stored at home. NOTE: This sheet is a summary. It may not cover all possible information. If you have questions about this medicine, talk to your doctor, pharmacist, or health care provider.  2013, Elsevier/Gold Standard. (06/20/2008 11:54:26 AM)   Carboplatin injection What is this medicine? CARBOPLATIN (KAR boe pla tin) is a chemotherapy drug. It targets fast dividing cells, like cancer cells, and causes these cells to die. This medicine is used to treat ovarian cancer and many other cancers. This medicine may be used for other purposes; ask your health care provider or pharmacist if you have questions. What should I  tell my health care provider before I take this medicine? They need to know if you have any of these conditions: -blood disorders -hearing problems -kidney disease -recent or ongoing radiation therapy -an unusual or allergic reaction to  carboplatin, cisplatin, other chemotherapy, other medicines, foods, dyes, or preservatives -pregnant or trying to get pregnant -breast-feeding How should I use this medicine? This drug is usually given as an infusion into a vein. It is administered in a hospital or clinic by a specially trained health care professional. Talk to your pediatrician regarding the use of this medicine in children. Special care may be needed. Overdosage: If you think you have taken too much of this medicine contact a poison control center or emergency room at once. NOTE: This medicine is only for you. Do not share this medicine with others. What if I miss a dose? It is important not to miss a dose. Call your doctor or health care professional if you are unable to keep an appointment. What may interact with this medicine? -medicines for seizures -medicines to increase blood counts like filgrastim, pegfilgrastim, sargramostim -some antibiotics like amikacin, gentamicin, neomycin, streptomycin, tobramycin -vaccines Talk to your doctor or health care professional before taking any of these medicines: -acetaminophen -aspirin -ibuprofen -ketoprofen -naproxen This list may not describe all possible interactions. Give your health care provider a list of all the medicines, herbs, non-prescription drugs, or dietary supplements you use. Also tell them if you smoke, drink alcohol, or use illegal drugs. Some items may interact with your medicine. What should I watch for while using this medicine? Your condition will be monitored carefully while you are receiving this medicine. You will need important blood work done while you are taking this medicine. This drug may make you feel generally unwell. This is not uncommon, as chemotherapy can affect healthy cells as well as cancer cells. Report any side effects. Continue your course of treatment even though you feel ill unless your doctor tells you to stop. In some cases, you may  be given additional medicines to help with side effects. Follow all directions for their use. Call your doctor or health care professional for advice if you get a fever, chills or sore throat, or other symptoms of a cold or flu. Do not treat yourself. This drug decreases your body's ability to fight infections. Try to avoid being around people who are sick. This medicine may increase your risk to bruise or bleed. Call your doctor or health care professional if you notice any unusual bleeding. Be careful brushing and flossing your teeth or using a toothpick because you may get an infection or bleed more easily. If you have any dental work done, tell your dentist you are receiving this medicine. Avoid taking products that contain aspirin, acetaminophen, ibuprofen, naproxen, or ketoprofen unless instructed by your doctor. These medicines may hide a fever. Do not become pregnant while taking this medicine. Women should inform their doctor if they wish to become pregnant or think they might be pregnant. There is a potential for serious side effects to an unborn child. Talk to your health care professional or pharmacist for more information. Do not breast-feed an infant while taking this medicine. What side effects may I notice from receiving this medicine? Side effects that you should report to your doctor or health care professional as soon as possible: -allergic reactions like skin rash, itching or hives, swelling of the face, lips, or tongue -signs of infection - fever or chills, cough, sore throat, pain  or difficulty passing urine -signs of decreased platelets or bleeding - bruising, pinpoint red spots on the skin, black, tarry stools, nosebleeds -signs of decreased red blood cells - unusually weak or tired, fainting spells, lightheadedness -breathing problems -changes in hearing -changes in vision -chest pain -high blood pressure -low blood counts - This drug may decrease the number of white blood  cells, red blood cells and platelets. You may be at increased risk for infections and bleeding. -nausea and vomiting -pain, swelling, redness or irritation at the injection site -pain, tingling, numbness in the hands or feet -problems with balance, talking, walking -trouble passing urine or change in the amount of urine Side effects that usually do not require medical attention (report to your doctor or health care professional if they continue or are bothersome): -hair loss -loss of appetite -metallic taste in the mouth or changes in taste This list may not describe all possible side effects. Call your doctor for medical advice about side effects. You may report side effects to FDA at 1-800-FDA-1088. Where should I keep my medicine? This drug is given in a hospital or clinic and will not be stored at home. NOTE: This sheet is a summary. It may not cover all possible information. If you have questions about this medicine, talk to your doctor, pharmacist, or health care provider.  2013, [employer]vier/Gold Standard. (10/13/2007 2:38:05 PM)

## 2013-03-11 ENCOUNTER — Ambulatory Visit (HOSPITAL_BASED_OUTPATIENT_CLINIC_OR_DEPARTMENT_OTHER): Payer: Medicare PPO

## 2013-03-11 ENCOUNTER — Other Ambulatory Visit (HOSPITAL_COMMUNITY): Payer: Self-pay | Admitting: Cardiovascular Disease

## 2013-03-11 ENCOUNTER — Telehealth: Payer: Self-pay | Admitting: *Deleted

## 2013-03-11 VITALS — BP 100/58 | HR 82 | Temp 97.9°F

## 2013-03-11 DIAGNOSIS — I714 Abdominal aortic aneurysm, without rupture: Secondary | ICD-10-CM

## 2013-03-11 DIAGNOSIS — C3490 Malignant neoplasm of unspecified part of unspecified bronchus or lung: Secondary | ICD-10-CM

## 2013-03-11 DIAGNOSIS — Z5189 Encounter for other specified aftercare: Secondary | ICD-10-CM

## 2013-03-11 DIAGNOSIS — C771 Secondary and unspecified malignant neoplasm of intrathoracic lymph nodes: Secondary | ICD-10-CM

## 2013-03-11 MED ORDER — PEGFILGRASTIM INJECTION 6 MG/0.6ML
6.0000 mg | Freq: Once | SUBCUTANEOUS | Status: AC
  Administered 2013-03-11: 6 mg via SUBCUTANEOUS
  Filled 2013-03-11: qty 0.6

## 2013-03-11 NOTE — Telephone Encounter (Signed)
Patient here for Neulasta injection following 1st taxol/carbo chemotherapy.  Accompanied with an interrupter.  States that he is doing well.  No nausea, vomiting or diarrhea.  Doesn't feel like eating.  Encouraged to eat small frequent meals and to drink more fluids.  Knows to call if he has any problems or concerns.  All questions answered.

## 2013-03-12 ENCOUNTER — Telehealth: Payer: Self-pay | Admitting: Dietician

## 2013-03-17 ENCOUNTER — Other Ambulatory Visit (HOSPITAL_BASED_OUTPATIENT_CLINIC_OR_DEPARTMENT_OTHER): Payer: Medicare PPO

## 2013-03-17 ENCOUNTER — Ambulatory Visit (HOSPITAL_BASED_OUTPATIENT_CLINIC_OR_DEPARTMENT_OTHER): Payer: Medicare PPO

## 2013-03-17 ENCOUNTER — Ambulatory Visit: Payer: Medicare PPO | Admitting: Internal Medicine

## 2013-03-17 ENCOUNTER — Ambulatory Visit (HOSPITAL_BASED_OUTPATIENT_CLINIC_OR_DEPARTMENT_OTHER): Payer: Medicare PPO | Admitting: Physician Assistant

## 2013-03-17 ENCOUNTER — Telehealth: Payer: Self-pay | Admitting: *Deleted

## 2013-03-17 ENCOUNTER — Telehealth: Payer: Self-pay | Admitting: Internal Medicine

## 2013-03-17 VITALS — BP 86/50 | HR 89 | Temp 97.6°F | Resp 17 | Ht 67.0 in

## 2013-03-17 DIAGNOSIS — C7951 Secondary malignant neoplasm of bone: Secondary | ICD-10-CM

## 2013-03-17 DIAGNOSIS — C3430 Malignant neoplasm of lower lobe, unspecified bronchus or lung: Secondary | ICD-10-CM

## 2013-03-17 DIAGNOSIS — C3490 Malignant neoplasm of unspecified part of unspecified bronchus or lung: Secondary | ICD-10-CM

## 2013-03-17 DIAGNOSIS — I959 Hypotension, unspecified: Secondary | ICD-10-CM

## 2013-03-17 DIAGNOSIS — E860 Dehydration: Secondary | ICD-10-CM

## 2013-03-17 DIAGNOSIS — C787 Secondary malignant neoplasm of liver and intrahepatic bile duct: Secondary | ICD-10-CM

## 2013-03-17 DIAGNOSIS — R0602 Shortness of breath: Secondary | ICD-10-CM

## 2013-03-17 DIAGNOSIS — R630 Anorexia: Secondary | ICD-10-CM

## 2013-03-17 DIAGNOSIS — R634 Abnormal weight loss: Secondary | ICD-10-CM

## 2013-03-17 DIAGNOSIS — C771 Secondary and unspecified malignant neoplasm of intrathoracic lymph nodes: Secondary | ICD-10-CM

## 2013-03-17 LAB — CBC WITH DIFFERENTIAL/PLATELET
BASO%: 0.2 % (ref 0.0–2.0)
EOS%: 0.2 % (ref 0.0–7.0)
Eosinophils Absolute: 0 10*3/uL (ref 0.0–0.5)
LYMPH%: 5.2 % — ABNORMAL LOW (ref 14.0–49.0)
MCH: 28.3 pg (ref 27.2–33.4)
MCHC: 32.6 g/dL (ref 32.0–36.0)
MCV: 86.7 fL (ref 79.3–98.0)
MONO%: 12.1 % (ref 0.0–14.0)
Platelets: 134 10*3/uL — ABNORMAL LOW (ref 140–400)
RBC: 3.15 10*6/uL — ABNORMAL LOW (ref 4.20–5.82)
nRBC: 0 % (ref 0–0)

## 2013-03-17 LAB — COMPREHENSIVE METABOLIC PANEL (CC13)
ALT: 36 U/L (ref 0–55)
Alkaline Phosphatase: 93 U/L (ref 40–150)
CO2: 23 mEq/L (ref 22–29)
Creatinine: 2.5 mg/dL — ABNORMAL HIGH (ref 0.7–1.3)
Total Bilirubin: 0.46 mg/dL (ref 0.20–1.20)

## 2013-03-17 MED ORDER — SODIUM CHLORIDE 0.9 % IJ SOLN
10.0000 mL | INTRAMUSCULAR | Status: DC | PRN
  Administered 2013-03-17: 10 mL via INTRAVENOUS
  Filled 2013-03-17: qty 10

## 2013-03-17 MED ORDER — SODIUM CHLORIDE 0.9 % IV SOLN
INTRAVENOUS | Status: AC
  Administered 2013-03-17: 11:00:00 via INTRAVENOUS

## 2013-03-17 MED ORDER — HEPARIN SOD (PORK) LOCK FLUSH 100 UNIT/ML IV SOLN
500.0000 [IU] | Freq: Once | INTRAVENOUS | Status: AC
  Administered 2013-03-17: 500 [IU] via INTRAVENOUS
  Filled 2013-03-17: qty 5

## 2013-03-17 NOTE — Patient Instructions (Addendum)
Dehydration, Adult  Dehydration means your body does not have as much fluid as it needs. Your kidneys, brain, and heart will not work properly without the right amount of fluids and salt.   HOME CARE   Ask your doctor how to replace body fluid losses (rehydrate).   Drink enough fluids to keep your pee (urine) clear or pale yellow.   Drink small amounts of fluids often if you feel sick to your stomach (nauseous) or throw up (vomit).   Eat like you normally do.   Avoid:   Foods or drinks high in sugar.   Bubbly (carbonated) drinks.   Juice.   Very hot or cold fluids.   Drinks with caffeine.   Fatty, greasy foods.   Alcohol.   Tobacco.   Eating too much.   Gelatin desserts.   Wash your hands to avoid spreading germs (bacteria, viruses).   Only take medicine as told by your doctor.   Keep all doctor visits as told.  GET HELP RIGHT AWAY IF:    You cannot drink something without throwing up.   You get worse even with treatment.   Your vomit has blood in it or looks greenish.   Your poop (stool) has blood in it or looks black and tarry.   You have not peed in 6 to 8 hours.   You pee a small amount of very dark pee.   You have a fever.   You pass out (faint).   You have belly (abdominal) pain that gets worse or stays in one spot (localizes).   You have a rash, stiff neck, or bad headache.   You get easily annoyed, sleepy, or are hard to wake up.   You feel weak, dizzy, or very thirsty.  MAKE SURE YOU:    Understand these instructions.   Will watch your condition.   Will get help right away if you are not doing well or get worse.  Document Released: 05/04/2009 Document Revised: 09/30/2011 Document Reviewed: 02/25/2011  [employer] Patient Information 2014 [employer].

## 2013-03-17 NOTE — Progress Notes (Signed)
Patient's blood pressure now 150/70 after receiving 1 liter normal saline and with ambulation.  Patient no longer with headache or dizziness.  Tiana Loft, PA notified and okay for patient to discharge home.  AVS reviewed with patient and his family member.  I stressed the importance of drinking plenty of fluids and notifying MD if symptoms come back.  Allayne Butcher [facility]  03/17/2013  12:54 PM

## 2013-03-17 NOTE — Patient Instructions (Addendum)
Dehydration, Adult Dehydration means your body does not have as much fluid as it needs. Your kidneys, brain, and heart will not work properly without the right amount of fluids and salt.  HOME CARE  Ask your doctor how to replace body fluid losses (rehydrate).  Drink enough fluids to keep your pee (urine) clear or pale yellow.  Drink small amounts of fluids often if you feel sick to your stomach (nauseous) or throw up (vomit).  Eat like you normally do.  Avoid:  Foods or drinks high in sugar.  Bubbly (carbonated) drinks.  Juice.  Very hot or cold fluids.  Drinks with caffeine.  Fatty, greasy foods.  Alcohol.  Tobacco.  Eating too much.  Gelatin desserts.  Wash your hands to avoid spreading germs (bacteria, viruses).  Only take medicine as told by your doctor.  Keep all doctor visits as told. GET HELP RIGHT AWAY IF:   You cannot drink something without throwing up.  You get worse even with treatment.  Your vomit has blood in it or looks greenish.  Your poop (stool) has blood in it or looks black and tarry.  You have not peed in 6 to 8 hours.  You pee a small amount of very dark pee.  You have a fever.  You pass out (faint).  You have belly (abdominal) pain that gets worse or stays in one spot (localizes).  You have a rash, stiff neck, or bad headache.  You get easily annoyed, sleepy, or are hard to wake up.  You feel weak, dizzy, or very thirsty. MAKE SURE YOU:   Understand these instructions.  Will watch your condition.  Will get help right away if you are not doing well or get worse. Document Released: 05/04/2009 Document Revised: 09/30/2011 Document Reviewed: 02/25/2011 [facility] Patient Information 2014 [place], [place].  Continue with weekly labs as scheduled Followup with Dr. Arbutus Ped in 2 weeks prior to next scheduled cycle of chemotherapy

## 2013-03-17 NOTE — Telephone Encounter (Signed)
Per staff message and POF I have scheduled appts.  JMW  

## 2013-03-17 NOTE — Telephone Encounter (Signed)
Gave pt appt for lab , MD and chemo emailed Marcelino Duster regarding chemo adjustment

## 2013-03-20 NOTE — Progress Notes (Signed)
[facility] Telephone:([phone]   Fax:([phone]) [phone]  OFFICE PROGRESS NOTE  Runell Gess, MD [address] [place] [address]  DIAGNOSIS: Metastatic non-small cell lung cancer, squamous cell carcinoma with a large anterior mediastinal mass as well as bilateral lymphadenopathy and liver as well as bone metastases diagnosed in June of 2014  PRIOR THERAPY: Palliative radiotherapy to the manubrium under the care of Dr. Kathrynn Running.  CURRENT THERAPY: Systemic chemotherapy with carboplatin for AUC of 5 and paclitaxel 175 mg/M2 every 3 weeks with Neulasta support. Status post 1 cycle given on 03/10/2013.  CHEMOTHERAPY INTENT: Palliative  CURRENT # OF CHEMOTHERAPY CYCLES: 1  CURRENT ANTIEMETICS: Zofran, dexamethasone and Compazine  CURRENT SMOKING STATUS: Former smoker quit 03/13/2009  ORAL CHEMOTHERAPY AND CONSENT: None  CURRENT BISPHOSPHONATES USE: None  PAIN MANAGEMENT: 0/10  NARCOTICS INDUCED CONSTIPATION: None  LIVING WILL AND CODE STATUS: Full code.   INTERVAL HISTORY: [name] 72 y.o. male returns to the clinic today for followup visit accompanied by his daughter and his [place] interpreter. The patient continues to complain of increasing fatigue and weakness as well as lack of appetite and weight loss. He has fallen 3 times in the past week according to his daughter. This morning he found within his head on the side of the door, but did not lose consciousness. He's had no Frank syncope. He continues to have very poor by mouth intake of both food and fluids. He is only drinking one in short dietary supplement a day in may be 16 ounces of fluid. He is having some issues with constipation but denied any problems with fever, chills or diarrhea. He is status post his first cycle of systemic chemotherapy with carboplatin and paclitaxel with Neulasta support and presents for symptom management visit today.  He denied having any significant  chest pain but has shortness breath with exertion and no cough or hemoptysis.   MEDICAL HISTORY: Past Medical History  Diagnosis Date  . Hypertension   . Ischemic cardiomyopathy Sept 2011    EF improved to 35-40% 2D  . COPD (chronic obstructive pulmonary disease)   . LBBB (left bundle branch block)   . Acute anterior myocardial infarction 2011    s/p PCI to LAD; 2.25 x 12 mm BMS  . ICD (implantable cardiac defibrillator) in place   . Anginal pain   . Sleep apnea     "while sleeping last week" (11/12/2012)  . Stroke 2009    "sometimes left side goes numb/weak/gives out" (11/12/2012)  . Pacemaker   . Shortness of breath     Hx: of with exertion   . Headache(784.0)   . Cancer     Lung cancer    ALLERGIES:  has No Known Allergies.  MEDICATIONS:  Current Outpatient Prescriptions  Medication Sig Dispense Refill  . carvedilol (COREG) 6.25 MG tablet Take 6.25 mg by mouth 2 (two) times daily with a meal.      . furosemide (LASIX) 40 MG tablet Take 40 mg by mouth 2 (two) times daily.      Marland Kitchen ibuprofen (ADVIL,MOTRIN) 400 MG tablet Take 400 mg by mouth every 6 (six) hours as needed for pain.      . isosorbide dinitrate (ISORDIL) 10 MG tablet       . lidocaine-prilocaine (EMLA) cream Apply topically as needed.  30 g  0  . methylPREDNIsolone (MEDROL DOSPACK) 4 MG tablet follow package directions  21 tablet  0  . mirtazapine (REMERON)  15 MG tablet Take 1 tablet (15 mg total) by mouth at bedtime.  30 tablet  2  . prochlorperazine (COMPAZINE) 10 MG tablet Take 1 tablet (10 mg total) by mouth every 6 (six) hours as needed.  30 tablet  0  . ramipril (ALTACE) 10 MG capsule Take 1 capsule (10 mg total) by mouth daily.  30 capsule  5   No current facility-administered medications for this visit.    SURGICAL HISTORY:  Past Surgical History  Procedure Laterality Date  . Bi-ventricular implantable cardioverter defibrillator  (crt-d)  11/12/2012  . Coronary angioplasty with stent placement   02/12/2010    LAD BMS  . Insert / replace / remove pacemaker    . Portacath placement Right 02/24/2013    Procedure: INSERTION PORT-A-CATH;  Surgeon: Alleen Borne, MD;  Location: [facility] OR;  Service: Thoracic;  Laterality: Right;    REVIEW OF SYSTEMS:  A comprehensive review of systems was negative except for: Constitutional: positive for anorexia, fatigue, weight loss and Generalized weakness Respiratory: positive for dyspnea on exertion Gastrointestinal: positive for constipation   PHYSICAL EXAMINATION: General appearance: alert, cooperative, fatigued and no distress Head: Normocephalic, without obvious abnormality, atraumatic Neck: no adenopathy Lymph nodes: Cervical, supraclavicular, and axillary nodes normal. Resp: clear to auscultation bilaterally Cardio: regular rate and rhythm, S1, S2 normal, no murmur, click, rub or gallop GI: soft, non-tender; bowel sounds normal; no masses,  no organomegaly Extremities: extremities normal, atraumatic, no cyanosis or edema Neurologic: Alert and oriented X 3, normal strength and tone. Normal symmetric reflexes. Normal coordination and gait There are no areas of ecchymosis about the head or any abrasions  ECOG PERFORMANCE STATUS: 2 - Symptomatic, <50% confined to bed  Blood pressure 86/50, pulse 89, temperature 97.6 F (36.4 C), temperature source Oral, resp. rate 17, height 5\' 7"  (1.702 m).  LABORATORY DATA: Lab Results  Component Value Date   WBC 5.2 03/17/2013   HGB 8.9* 03/17/2013   HCT 27.3* 03/17/2013   MCV 86.7 03/17/2013   PLT 134* 03/17/2013      Chemistry      Component Value Date/Time   NA 136 03/17/2013 0933   NA 137 02/24/2013 0716   K 5.0 03/17/2013 0933   K 3.9 02/24/2013 0716   CL 94* 02/24/2013 0716   CO2 23 03/17/2013 0933   CO2 29 02/24/2013 0716   BUN 68.4* 03/17/2013 0933   BUN 51* 02/24/2013 0716   CREATININE 2.5* 03/17/2013 0933   CREATININE 2.51* 02/24/2013 0716      Component Value Date/Time   CALCIUM 9.6 03/17/2013 0933    CALCIUM 9.9 02/24/2013 0716   ALKPHOS 93 03/17/2013 0933   ALKPHOS 91 02/24/2013 0716   AST 28 03/17/2013 0933   AST 27 02/24/2013 0716   ALT 36 03/17/2013 0933   ALT 16 02/24/2013 0716   BILITOT 0.46 03/17/2013 0933   BILITOT 0.4 02/24/2013 0716       RADIOGRAPHIC STUDIES: Dg Chest 2 View Within Previous 72 Hours.  Films Obtained On Friday Are Acceptable For Monday And Tuesday Cases  02/24/2013   *RADIOLOGY REPORT*  Clinical Data: Preoperative evaluation for surgery.  Shortness of breath.  Cough.  CHEST - 2 VIEW  Comparison: Chest x-ray 01/11/2013.  Findings: Lung volumes are normal.  No acute consolidative airspace disease.  Small calcified granuloma in the periphery of the right upper lobe, and extensive left apical nodular pleural parenchymal thickening most compatible with scarring, unchanged.  No other definite suspicious-appearing pulmonary nodules or  masses are identified on today's plain film examination.  No acute consolidative airspace disease.  No pleural effusions.  No evidence of pulmonary edema.  Heart size is normal.  Mediastinal contours are unremarkable.  Atherosclerosis in the thoracic aorta.  Left- sided biventricular pacemaker / AICD with lead tips projecting over the expected location of the right atrial appendage, right ventricular apex and overlying the lateral wall of the left ventricle via the coronary sinus and coronary veins.  IMPRESSION: 1.  No radiographic evidence of acute cardiopulmonary disease.  The appearance of chest is unchanged, as above. 2.  Known mediastinal and hilar adenopathy is not well demonstrated on today's plain film examination.  See PET-CT 01/19/2013 for full details.   Original Report Authenticated By: Trudie Reed, M.D.   Ct Head Wo Contrast  02/22/2013   *RADIOLOGY REPORT*  Clinical Data: Non-small cell lung cancer  CT HEAD WITHOUT CONTRAST  Technique:  Contiguous axial images were obtained from the base of the skull through the vertex without contrast.   Comparison: CT head 10/10 facet  Findings: Creatinine  2.1.  Intravenous contrast not administered.  Mild atrophy.  Chronic infarct right thalamus related to hemorrhage on the prior study.  Chronic microvascular ischemic change in the white matter.  Negative for acute infarct.  Negative for acute hemorrhage or mass lesion.  Detection of metastatic disease is diminished without intravenous contrast.  IMPRESSION: Atrophy and chronic microvascular ischemic change.  No acute abnormality.  Negative for metastatic disease on unenhanced images.   Original Report Authenticated By: Janeece Riggers, M.D.   Dg Chest Port 1 View  02/24/2013   *RADIOLOGY REPORT*  Clinical Data: Right subclavian Port-A-Cath insertion of the  PORTABLE CHEST - 1 VIEW  Comparison: Chest x-ray of 02/24/2013  Findings: A right-sided Port-A-Cath is now present with the tip overlying the lower SVC a few centimeters above the expected SVC - RA junction.  No pneumothorax is seen.  Cardiomegaly is stable and a defibrillator remains.  IMPRESSION: Right-sided Port-A-Cath tip overlies the lower SVC.  No pneumothorax.   Original Report Authenticated By: Dwyane Dee, M.D.   Dg Fluoro Guide Cv Line-no Report  02/24/2013   CLINICAL DATA: surgery   FLOURO GUIDE CV LINE  Fluoroscopy was utilized by the requesting physician.  No radiographic  interpretation.     ASSESSMENT AND PLAN: This is a very pleasant 72 years old [place] male with history of metastatic non-small cell lung cancer, squamous cell carcinoma status post palliative radiotherapy to the manubrium.  The patient is status post one cycle of systemic chemotherapy with carboplatin and paclitaxel. Patient was discussed with Dr. Truett Perna. The patient is very weak likely secondary to his significant decreased by mouth intake. His blood pressure is also low today at 86/50, he is afebrile. The patient will be given 1 L of normal saline IV fluids and we will recheck his blood pressure. His blood pressure  remains low of new OB admitted for further evaluation and management. Should she respond to the IV fluids he will be discharged home with encouragement to increase his by mouth intake of both food and fluids. He'll continue with weekly labs consisting of a CBC differential and C. met. He'll followup with Dr. Arbutus Ped in 2 weeks prior to the start of cycle #2 also with a repeat CBC differential and C. met.  Jess Toney E, PA-C   He was advised to call immediately if he has any concerning symptoms in the interval. The patient voices understanding of current  disease status and treatment options and is in agreement with the current care plan.  All questions were answered. The patient knows to call the clinic with any problems, questions or concerns. We can certainly see the patient much sooner if necessary.  I spent 25 minutes counseling the patient face to face. The total time spent in the appointment was 35 minutes.

## 2013-03-24 ENCOUNTER — Other Ambulatory Visit (HOSPITAL_BASED_OUTPATIENT_CLINIC_OR_DEPARTMENT_OTHER): Payer: Medicare PPO

## 2013-03-24 DIAGNOSIS — C3490 Malignant neoplasm of unspecified part of unspecified bronchus or lung: Secondary | ICD-10-CM

## 2013-03-24 LAB — CBC WITH DIFFERENTIAL/PLATELET
Eosinophils Absolute: 0.2 10*3/uL (ref 0.0–0.5)
HCT: 27.8 % — ABNORMAL LOW (ref 38.4–49.9)
LYMPH%: 1.5 % — ABNORMAL LOW (ref 14.0–49.0)
MCHC: 33 g/dL (ref 32.0–36.0)
MONO#: 1.4 10*3/uL — ABNORMAL HIGH (ref 0.1–0.9)
NEUT#: 19.7 10*3/uL — ABNORMAL HIGH (ref 1.5–6.5)
NEUT%: 90.9 % — ABNORMAL HIGH (ref 39.0–75.0)
Platelets: 173 10*3/uL (ref 140–400)
WBC: 21.7 10*3/uL — ABNORMAL HIGH (ref 4.0–10.3)

## 2013-03-24 LAB — COMPREHENSIVE METABOLIC PANEL (CC13)
CO2: 26 mEq/L (ref 22–29)
Creatinine: 2.3 mg/dL — ABNORMAL HIGH (ref 0.7–1.3)
Glucose: 95 mg/dl (ref 70–140)
Total Bilirubin: 0.57 mg/dL (ref 0.20–1.20)

## 2013-03-30 ENCOUNTER — Ambulatory Visit (HOSPITAL_BASED_OUTPATIENT_CLINIC_OR_DEPARTMENT_OTHER)
Admission: RE | Admit: 2013-03-30 | Discharge: 2013-03-30 | Disposition: A | Discharge status: Home / Self Care | Payer: Medicare PPO | Source: Ambulatory Visit | Attending: Cardiovascular Disease | Admitting: Cardiovascular Disease

## 2013-03-30 DIAGNOSIS — I714 Abdominal aortic aneurysm, without rupture, unspecified: Secondary | ICD-10-CM

## 2013-03-30 NOTE — Progress Notes (Signed)
Abdominal Aortic Duplex Completed °Brianna L Mazza,RVT °

## 2013-03-31 ENCOUNTER — Other Ambulatory Visit (HOSPITAL_BASED_OUTPATIENT_CLINIC_OR_DEPARTMENT_OTHER): Payer: Medicare PPO | Admitting: Lab

## 2013-03-31 ENCOUNTER — Telehealth: Payer: Self-pay | Admitting: Internal Medicine

## 2013-03-31 ENCOUNTER — Inpatient Hospital Stay (HOSPITAL_COMMUNITY): Payer: Medicare PPO

## 2013-03-31 ENCOUNTER — Inpatient Hospital Stay (HOSPITAL_COMMUNITY)
Admission: AD | Admit: 2013-03-31 | Discharge: 2013-04-02 | DRG: 640 | Disposition: A | Discharge status: Home / Self Care | Payer: Medicare PPO | Source: Ambulatory Visit | Attending: Internal Medicine | Admitting: Internal Medicine

## 2013-03-31 ENCOUNTER — Encounter: Payer: Medicare PPO | Admitting: Nutrition

## 2013-03-31 ENCOUNTER — Ambulatory Visit (HOSPITAL_BASED_OUTPATIENT_CLINIC_OR_DEPARTMENT_OTHER): Payer: Medicare PPO | Admitting: Physician Assistant

## 2013-03-31 ENCOUNTER — Ambulatory Visit: Payer: Medicare PPO

## 2013-03-31 VITALS — BP 92/46 | HR 87 | Temp 97.8°F

## 2013-03-31 DIAGNOSIS — Z9861 Coronary angioplasty status: Secondary | ICD-10-CM

## 2013-03-31 DIAGNOSIS — Z8673 Personal history of transient ischemic attack (TIA), and cerebral infarction without residual deficits: Secondary | ICD-10-CM

## 2013-03-31 DIAGNOSIS — I252 Old myocardial infarction: Secondary | ICD-10-CM

## 2013-03-31 DIAGNOSIS — Z95810 Presence of automatic (implantable) cardiac defibrillator: Secondary | ICD-10-CM

## 2013-03-31 DIAGNOSIS — I714 Abdominal aortic aneurysm, without rupture, unspecified: Secondary | ICD-10-CM | POA: Diagnosis present

## 2013-03-31 DIAGNOSIS — I129 Hypertensive chronic kidney disease with stage 1 through stage 4 chronic kidney disease, or unspecified chronic kidney disease: Secondary | ICD-10-CM | POA: Diagnosis present

## 2013-03-31 DIAGNOSIS — I959 Hypotension, unspecified: Secondary | ICD-10-CM | POA: Diagnosis present

## 2013-03-31 DIAGNOSIS — C771 Secondary and unspecified malignant neoplasm of intrathoracic lymph nodes: Secondary | ICD-10-CM | POA: Diagnosis present

## 2013-03-31 DIAGNOSIS — I509 Heart failure, unspecified: Secondary | ICD-10-CM

## 2013-03-31 DIAGNOSIS — C7951 Secondary malignant neoplasm of bone: Secondary | ICD-10-CM

## 2013-03-31 DIAGNOSIS — G4730 Sleep apnea, unspecified: Secondary | ICD-10-CM | POA: Diagnosis present

## 2013-03-31 DIAGNOSIS — I951 Orthostatic hypotension: Secondary | ICD-10-CM | POA: Diagnosis present

## 2013-03-31 DIAGNOSIS — I2589 Other forms of chronic ischemic heart disease: Secondary | ICD-10-CM

## 2013-03-31 DIAGNOSIS — R42 Dizziness and giddiness: Secondary | ICD-10-CM | POA: Diagnosis present

## 2013-03-31 DIAGNOSIS — Z9221 Personal history of antineoplastic chemotherapy: Secondary | ICD-10-CM

## 2013-03-31 DIAGNOSIS — E43 Unspecified severe protein-calorie malnutrition: Secondary | ICD-10-CM | POA: Diagnosis present

## 2013-03-31 DIAGNOSIS — E860 Dehydration: Secondary | ICD-10-CM

## 2013-03-31 DIAGNOSIS — D6481 Anemia due to antineoplastic chemotherapy: Secondary | ICD-10-CM | POA: Diagnosis present

## 2013-03-31 DIAGNOSIS — C3490 Malignant neoplasm of unspecified part of unspecified bronchus or lung: Secondary | ICD-10-CM

## 2013-03-31 DIAGNOSIS — T451X5A Adverse effect of antineoplastic and immunosuppressive drugs, initial encounter: Secondary | ICD-10-CM | POA: Diagnosis present

## 2013-03-31 DIAGNOSIS — I2510 Atherosclerotic heart disease of native coronary artery without angina pectoris: Secondary | ICD-10-CM | POA: Diagnosis present

## 2013-03-31 DIAGNOSIS — J449 Chronic obstructive pulmonary disease, unspecified: Secondary | ICD-10-CM | POA: Diagnosis present

## 2013-03-31 DIAGNOSIS — I10 Essential (primary) hypertension: Secondary | ICD-10-CM | POA: Diagnosis present

## 2013-03-31 DIAGNOSIS — N183 Chronic kidney disease, stage 3 unspecified: Secondary | ICD-10-CM | POA: Diagnosis present

## 2013-03-31 DIAGNOSIS — C787 Secondary malignant neoplasm of liver and intrahepatic bile duct: Secondary | ICD-10-CM

## 2013-03-31 DIAGNOSIS — J4489 Other specified chronic obstructive pulmonary disease: Secondary | ICD-10-CM | POA: Diagnosis present

## 2013-03-31 DIAGNOSIS — E785 Hyperlipidemia, unspecified: Secondary | ICD-10-CM

## 2013-03-31 DIAGNOSIS — D638 Anemia in other chronic diseases classified elsewhere: Secondary | ICD-10-CM | POA: Diagnosis present

## 2013-03-31 DIAGNOSIS — Z85118 Personal history of other malignant neoplasm of bronchus and lung: Secondary | ICD-10-CM

## 2013-03-31 DIAGNOSIS — Z681 Body mass index (BMI) 19 or less, adult: Secondary | ICD-10-CM

## 2013-03-31 DIAGNOSIS — I255 Ischemic cardiomyopathy: Secondary | ICD-10-CM

## 2013-03-31 DIAGNOSIS — R5381 Other malaise: Secondary | ICD-10-CM

## 2013-03-31 DIAGNOSIS — D630 Anemia in neoplastic disease: Secondary | ICD-10-CM | POA: Diagnosis present

## 2013-03-31 DIAGNOSIS — Z79899 Other long term (current) drug therapy: Secondary | ICD-10-CM

## 2013-03-31 DIAGNOSIS — I5042 Chronic combined systolic (congestive) and diastolic (congestive) heart failure: Secondary | ICD-10-CM | POA: Diagnosis present

## 2013-03-31 DIAGNOSIS — Z87891 Personal history of nicotine dependence: Secondary | ICD-10-CM

## 2013-03-31 DIAGNOSIS — Z955 Presence of coronary angioplasty implant and graft: Secondary | ICD-10-CM

## 2013-03-31 DIAGNOSIS — Z66 Do not resuscitate: Secondary | ICD-10-CM | POA: Diagnosis present

## 2013-03-31 DIAGNOSIS — R630 Anorexia: Secondary | ICD-10-CM | POA: Diagnosis present

## 2013-03-31 LAB — CBC WITH DIFFERENTIAL/PLATELET
Eosinophils Absolute: 0.1 10*3/uL (ref 0.0–0.5)
HCT: 25.9 % — ABNORMAL LOW (ref 38.4–49.9)
LYMPH%: 12.8 % — ABNORMAL LOW (ref 14.0–49.0)
MONO#: 0.5 10*3/uL (ref 0.1–0.9)
NEUT#: 6.2 10*3/uL (ref 1.5–6.5)
NEUT%: 79.7 % — ABNORMAL HIGH (ref 39.0–75.0)
Platelets: 288 10*3/uL (ref 140–400)
WBC: 7.8 10*3/uL (ref 4.0–10.3)
nRBC: 0 % (ref 0–0)

## 2013-03-31 LAB — URINALYSIS, ROUTINE W REFLEX MICROSCOPIC
Glucose, UA: NEGATIVE mg/dL
Ketones, ur: NEGATIVE mg/dL
Leukocytes, UA: NEGATIVE
Protein, ur: 30 mg/dL — AB
Urobilinogen, UA: 0.2 mg/dL (ref 0.0–1.0)

## 2013-03-31 LAB — MAGNESIUM: Magnesium: 2.2 mg/dL (ref 1.5–2.5)

## 2013-03-31 LAB — TROPONIN I
Troponin I: <0.3 ng/mL (ref <0.30)
Troponin I: <0.3 ng/mL

## 2013-03-31 LAB — COMPREHENSIVE METABOLIC PANEL (CC13)
Albumin: 2.9 g/dL — ABNORMAL LOW (ref 3.5–5.0)
Alkaline Phosphatase: 99 U/L (ref 40–150)
BUN: 52.9 mg/dL — ABNORMAL HIGH (ref 7.0–26.0)
Creatinine: 2.5 mg/dL — ABNORMAL HIGH (ref 0.7–1.3)
Glucose: 92 mg/dl (ref 70–140)
Total Bilirubin: 0.28 mg/dL (ref 0.20–1.20)

## 2013-03-31 LAB — CBC
HCT: 25 % — ABNORMAL LOW (ref 39.0–52.0)
Hemoglobin: 8.2 g/dL — ABNORMAL LOW (ref 13.0–17.0)
MCH: 28.7 pg (ref 26.0–34.0)
MCHC: 32.8 g/dL (ref 30.0–36.0)
MCV: 87.4 fL (ref 78.0–100.0)
Platelets: 265 K/uL (ref 150–400)
RBC: 2.86 MIL/uL — ABNORMAL LOW (ref 4.22–5.81)
RDW: 14.5 % (ref 11.5–15.5)
WBC: 8.9 K/uL (ref 4.0–10.5)

## 2013-03-31 LAB — URINE MICROSCOPIC-ADD ON

## 2013-03-31 LAB — CREATININE, SERUM: GFR calc non Af Amer: 29 mL/min — ABNORMAL LOW (ref >90)

## 2013-03-31 MED ORDER — DEXTROSE-NACL 5-0.45 % IV SOLN
INTRAVENOUS | Status: AC
  Administered 2013-03-31 – 2013-04-01 (×2): via INTRAVENOUS

## 2013-03-31 MED ORDER — ASPIRIN EC 81 MG PO TBEC
81.0000 mg | DELAYED_RELEASE_TABLET | Freq: Every day | ORAL | Status: DC
  Administered 2013-03-31 – 2013-04-02 (×3): 81 mg via ORAL
  Filled 2013-03-31 (×3): qty 1

## 2013-03-31 MED ORDER — PANTOPRAZOLE SODIUM 40 MG PO TBEC
40.0000 mg | DELAYED_RELEASE_TABLET | Freq: Every day | ORAL | Status: DC
  Administered 2013-03-31 – 2013-04-02 (×3): 40 mg via ORAL
  Filled 2013-03-31 (×4): qty 1

## 2013-03-31 MED ORDER — ACETAMINOPHEN 650 MG RE SUPP: 650.0000 mg | Freq: Four times a day (QID) | RECTAL | Status: DC | PRN

## 2013-03-31 MED ORDER — HEPARIN SOD (PORK) LOCK FLUSH 100 UNIT/ML IV SOLN
500.0000 [IU] | Freq: Once | INTRAVENOUS | Status: AC
  Administered 2013-03-31: 500 [IU] via INTRAVENOUS
  Filled 2013-03-31: qty 5

## 2013-03-31 MED ORDER — ATORVASTATIN CALCIUM 40 MG PO TABS
40.0000 mg | ORAL_TABLET | Freq: Every day | ORAL | Status: DC
  Administered 2013-03-31 – 2013-04-01 (×2): 40 mg via ORAL
  Filled 2013-03-31 (×3): qty 1

## 2013-03-31 MED ORDER — ONDANSETRON HCL 4 MG PO TABS: 4.0000 mg | ORAL_TABLET | Freq: Four times a day (QID) | ORAL | Status: DC | PRN

## 2013-03-31 MED ORDER — SODIUM CHLORIDE 0.9 % IJ SOLN
10.0000 mL | INTRAMUSCULAR | Status: DC | PRN
  Administered 2013-03-31: 10 mL via INTRAVENOUS
  Filled 2013-03-31: qty 10

## 2013-03-31 MED ORDER — MIRTAZAPINE 15 MG PO TABS
15.0000 mg | ORAL_TABLET | Freq: Every day | ORAL | Status: DC
  Administered 2013-03-31 – 2013-04-01 (×2): 15 mg via ORAL
  Filled 2013-03-31 (×3): qty 1

## 2013-03-31 MED ORDER — ENSURE COMPLETE PO LIQD
237.0000 mL | Freq: Three times a day (TID) | ORAL | Status: DC
  Administered 2013-03-31 – 2013-04-02 (×4): 237 mL via ORAL

## 2013-03-31 MED ORDER — CARVEDILOL 3.125 MG PO TABS
3.1250 mg | ORAL_TABLET | Freq: Two times a day (BID) | ORAL | Status: DC
  Administered 2013-04-01 – 2013-04-02 (×3): 3.125 mg via ORAL
  Filled 2013-03-31 (×6): qty 1

## 2013-03-31 MED ORDER — ENOXAPARIN SODIUM 40 MG/0.4ML ~~LOC~~ SOLN
40.0000 mg | SUBCUTANEOUS | Status: DC
  Filled 2013-03-31: qty 0.4

## 2013-03-31 MED ORDER — SODIUM CHLORIDE 0.9 % IV SOLN
INTRAVENOUS | Status: DC
  Administered 2013-03-31: 12:00:00 via INTRAVENOUS

## 2013-03-31 MED ORDER — FUROSEMIDE 10 MG/ML IJ SOLN: 20.0000 mg | Freq: Once | INTRAMUSCULAR | Status: DC

## 2013-03-31 MED ORDER — ENOXAPARIN SODIUM 30 MG/0.3ML ~~LOC~~ SOLN
30.0000 mg | SUBCUTANEOUS | Status: DC
  Administered 2013-03-31 – 2013-04-01 (×2): 30 mg via SUBCUTANEOUS
  Filled 2013-03-31 (×3): qty 0.3

## 2013-03-31 MED ORDER — ACETAMINOPHEN 325 MG PO TABS: 650.0000 mg | ORAL_TABLET | Freq: Four times a day (QID) | ORAL | Status: DC | PRN

## 2013-03-31 MED ORDER — ONDANSETRON HCL 4 MG/2ML IJ SOLN: 4.0000 mg | Freq: Four times a day (QID) | INTRAMUSCULAR | Status: DC | PRN

## 2013-03-31 NOTE — Progress Notes (Signed)
IV rate decreased to 75 ml/hr.

## 2013-03-31 NOTE — Patient Instructions (Addendum)
Your chemotherapy has been postponed until you are stabilized and stronger. You are being admitted to further evaluate and manage your low blood pressure complaints of weakness and dizziness.

## 2013-03-31 NOTE — Progress Notes (Signed)
UR completed 

## 2013-03-31 NOTE — Consult Note (Addendum)
Reason for Consult:Hypotension, Medication recommendations Referring Physician: TRH   HPI: The patient is a 72 y/o [place] male followed at [facility] by Dr. Allyson Sabal. He has a history of ischemic cardiomyopathy and is s/p STEMI 3 years ago. He had a recent admission in April of this year for chest pain and SOB. He ruled out for MI and had a low risk stress test, however, he was found to have worsening left ventricular dysfunction via 2D echo, and had developed worsening heart failure symptoms. He subsequently underwent insertion of a biventricular ICD by Dr. Ladona Ridgel. Recently, he was diagnosed with squamous cell carcinoma of the lung. He is being followed by Dr. Arbutus Ped for this.  Last Chemo - 8/21, was due today.  He presented to the [facility] ER today with a complaint of weakness, dizziness.    He really denies any significant N/V & his daughter reports that he has been eating & drinking OK.  Cardiovascular ROS: no chest pain or dyspnea on exertion negative for - edema, irregular heartbeat, loss of consciousness, murmur, orthopnea, palpitations, paroxysmal nocturnal dyspnea, rapid heart rate or shortness of breath; no melena, hematochezia or hematuria.   Past Medical History  Diagnosis Date  . Hypertension   . Ischemic cardiomyopathy Sept 2011    EF improved to 35-40% 2D  . COPD (chronic obstructive pulmonary disease)   . LBBB (left bundle branch block)   . Acute anterior myocardial infarction 2011    s/p PCI to LAD; 2.25 x 12 mm BMS  . ICD (implantable cardiac defibrillator) in place   . Anginal pain   . Sleep apnea     "while sleeping last week" (11/12/2012)  . Stroke 2009    "sometimes left side goes numb/weak/gives out" (11/12/2012)  . Pacemaker   . Shortness of breath     Hx: of with exertion   . Headache(784.0)   . Cancer     Lung cancer    Past Surgical History  Procedure Laterality Date  . Bi-ventricular implantable cardioverter defibrillator  (crt-d)  11/12/2012  .  Coronary angioplasty with stent placement  02/12/2010    LAD BMS  . Insert / replace / remove pacemaker    . Portacath placement Right 02/24/2013    Procedure: INSERTION PORT-A-CATH;  Surgeon: Alleen Borne, MD;  Location: [facility] OR;  Service: Thoracic;  Laterality: Right;    No family history on file.  Social History:  reports that he quit smoking about 4 years ago. His smoking use included Cigarettes. He has a 22.5 pack-year smoking history. He has never used smokeless tobacco. He reports that he does not drink alcohol or use illicit drugs.  FAMILY HISTORY: Negative for stroke, coronary artery disease, cancer or  diabetes mellitus by report & from chart review.  Allergies: No Known Allergies  Medications:  Prior to Admission medications   Medication Sig Start Date End Date Taking? Authorizing Provider  carvedilol (COREG) 6.25 MG tablet Take 6.25 mg by mouth 2 (two) times daily with a meal.   Yes Historical Provider, MD  furosemide (LASIX) 40 MG tablet Take 40 mg by mouth 2 (two) times daily.   Yes Historical Provider, MD  ibuprofen (ADVIL,MOTRIN) 400 MG tablet Take 400 mg by mouth every 6 (six) hours as needed for pain.   Yes Historical Provider, MD  isosorbide dinitrate (ISORDIL) 10 MG tablet Take 10 mg by mouth 3 (three) times daily.  01/12/13  Yes Historical Provider, MD  lidocaine-prilocaine (EMLA) cream Apply topically as needed. 02/18/13  Yes Si Gaul, MD  mirtazapine (REMERON) 15 MG tablet Take 1 tablet (15 mg total) by mouth at bedtime. 03/10/13  Yes Si Gaul, MD  ramipril (ALTACE) 10 MG capsule Take 1 capsule (10 mg total) by mouth daily. 11/01/12  Yes Brittainy Simmons, PA-C  rosuvastatin (CRESTOR) 20 MG tablet Take 20 mg by mouth daily.   Yes Historical Provider, MD   Results for orders placed during the hospital encounter of 03/31/13 (from the past 48 hour(s))  MRSA PCR SCREENING     Status: None   Collection Time    03/31/13  1:00 PM      Result Value Range   MRSA  by PCR NEGATIVE  NEGATIVE   Comment:            The GeneXpert MRSA Assay (FDA     approved for NASAL specimens     only), is one component of a     comprehensive MRSA colonization     surveillance program. It is not     intended to diagnose MRSA     infection nor to guide or     monitor treatment for     MRSA infections.  CBC     Status: Abnormal   Collection Time    03/31/13  3:20 PM      Result Value Range   WBC 8.9  4.0 - 10.5 K/uL   RBC 2.86 (*) 4.22 - 5.81 MIL/uL   Hemoglobin 8.2 (*) 13.0 - 17.0 g/dL   HCT 16.1 (*) 09.6 - 04.5 %   MCV 87.4  78.0 - 100.0 fL   MCH 28.7  26.0 - 34.0 pg   MCHC 32.8  30.0 - 36.0 g/dL   RDW 40.9  81.1 - 91.4 %   Platelets 265  150 - 400 K/uL  CREATININE, SERUM     Status: Abnormal   Collection Time    03/31/13  3:20 PM      Result Value Range   Creatinine, Ser 2.16 (*) 0.50 - 1.35 mg/dL   GFR calc non Af Amer 29 (*) >90 mL/min   GFR calc Af Amer 33 (*) >90 mL/min   Comment: (NOTE)     The eGFR has been calculated using the CKD EPI equation.     This calculation has not been validated in all clinical situations.     eGFR's persistently <90 mL/min signify possible Chronic Kidney     Disease.  PHOSPHORUS     Status: None   Collection Time    03/31/13  3:20 PM      Result Value Range   Phosphorus 3.1  2.3 - 4.6 mg/dL  MAGNESIUM     Status: None   Collection Time    03/31/13  3:20 PM      Result Value Range   Magnesium 2.2  1.5 - 2.5 mg/dL  TROPONIN I     Status: None   Collection Time    03/31/13  3:20 PM      Result Value Range   Troponin I <0.30  <0.30 ng/mL   Comment:            Due to the release kinetics of cTnI,     a negative result within the first hours     of the onset of symptoms does not rule out     myocardial infarction with certainty.     If myocardial infarction is still suspected,     repeat the test at appropriate intervals.  Dg Chest 1 View  03/31/2013   *RADIOLOGY REPORT*  Clinical Data: Hypertension,  shortness of breath on exertion, history of lung cancer and CHF  CHEST - 1 VIEW  Comparison: 02/24/2013; PET CT - 01/19/2013; chest CT - 01/11/2013  Findings:  Grossly unchanged cardiac silhouette and mediastinal contours with mild ectasia of the thoracic aorta.  Stable positioning of support apparatus.  The lungs remain hyperexpanded.  No focal airspace opacity.  No pleural effusion or pneumothorax.  No definite evidence of edema.  Unchanged bones.  IMPRESSION: Hyperexpanded lungs without acute cardiopulmonary disease.   Original Report Authenticated By: Tacey Ruiz, MD   per Dr. Herbie Baltimore below  Review of Systems  Constitutional: Positive for malaise/fatigue. Negative for fever and chills.  HENT: Positive for tinnitus. Negative for hearing loss.   Eyes: Negative.  Negative for blurred vision.  Respiratory: Negative.   Cardiovascular: Negative.        Orthostatic hypotension  Gastrointestinal: Negative.        Loss of appetite & decreased PO intake by report - but he says all is normal.  Genitourinary: Negative.   Musculoskeletal: Negative.   Skin: Negative for rash.  Neurological: Positive for weakness. Negative for headaches.       Generalized weakness.  Endo/Heme/Allergies: Bruises/bleeds easily.  Psychiatric/Behavioral: Negative.     Blood pressure 102/53, pulse 84, temperature 97.9 F (36.6 C), temperature source Oral, resp. rate 22, SpO2 100.00%. Physical Exam  Constitutional: He is oriented to person, place, and time.  Thin, frail.  Chronically ill appearing. Normal mood & affect; answers ?s appropriately via daughter.  HENT:  Head: Normocephalic.  Mouth/Throat: No oropharyngeal exudate.  Eyes: Pupils are equal, round, and reactive to light. No scleral icterus.  Neck: Normal range of motion. Neck supple. No JVD present. No tracheal deviation present. No thyromegaly present.  Cardiovascular: Normal rate, regular rhythm and normal heart sounds.  Exam reveals no gallop and no  friction rub.   No murmur heard. Mildly diminished distal pulses.  Respiratory: Effort normal and breath sounds normal. No stridor. No respiratory distress. He has no wheezes. He has no rales. He exhibits no tenderness.  GI: Soft. Bowel sounds are normal. He exhibits no mass. There is no tenderness. There is no rebound and no guarding.  Genitourinary:  deferred  Musculoskeletal: Normal range of motion. He exhibits no edema and no tenderness.  Neurological: He is alert and oriented to person, place, and time. No cranial nerve deficit.  5/5 strength  Skin: Skin is warm. No erythema.  Psychiatric: He has a normal mood and affect. Thought content normal.   -  Assessment/Plan: Principal Problem:   Orthostasis Active Problems:   CAD, STEMI July 2011 Rx'd with LAD BMS with moderate residual RCA/CFX disease. 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 Herbie Baltimore to see and will provide recommendations.   Allayne Butcher, PA-C 03/31/2013, 4:45 PM   I saw Mr. Okane today - he is known to [place] with h/o CAD-Ant STEMI & ICM - s/p BiVICD - recently d/x with  Lung Ca s/p resection & on chemo currently. Admitted for dehydration, hypotension.  We are asked to help with recommendations re: his medications.  With Cr up to ~>2, I agree with holding ACE-I for now & until return of stable BP & renal Fxn.   I also agree with decreasing, but not stopping BB. Clearly, as he is not volume overloaded, he does not need a diuretic.   Continuing ASA & statin is reasonable.  Will  will follow along & make further recommendations once he returns to a more stable BP & level of Sx.  He is also anemic - from chemotherapy & may be more so after hydration.  [name], M.D., M.S. [facility] [address] [place], [place]  [address]  [phone] Pager # [phone] 03/31/2013 5:39 PM

## 2013-03-31 NOTE — Progress Notes (Addendum)
[facility] [phone]   [phone]  OFFICE PROGRESS NOTE  [name] Gess, MD [address] [place] [address]  DIAGNOSIS: Metastatic non-small cell lung cancer, squamous cell carcinoma with a large anterior mediastinal mass as well as bilateral lymphadenopathy and liver as well as bone metastases diagnosed in June of 2014  PRIOR THERAPY: Palliative radiotherapy to the manubrium under the care of Dr. Kathrynn Running.  CURRENT THERAPY: Systemic chemotherapy with carboplatin for AUC of 5 and paclitaxel 175 mg/M2 every 3 weeks with Neulasta support. Status post 1 cycle given on 03/10/2013.  CHEMOTHERAPY INTENT: Palliative  CURRENT # OF CHEMOTHERAPY CYCLES: 1  CURRENT ANTIEMETICS: Zofran, dexamethasone and Compazine  CURRENT SMOKING STATUS: Former smoker quit 03/13/2009  ORAL CHEMOTHERAPY AND CONSENT: None  CURRENT BISPHOSPHONATES USE: None  PAIN MANAGEMENT: 0/10  NARCOTICS INDUCED CONSTIPATION: None  LIVING WILL AND CODE STATUS: Full code.   INTERVAL HISTORY: Floyde Dingley 72 y.o. male returns to the clinic today for followup visit accompanied by his daughter and his [place] interpreter. He complains today of continued fatigue, weakness and some dizziness. His by mouth intake continues to be poor both of fluids and food. He denies any fever or chills. He is on carvedilol, furosemide, isosorbide dinitrate and ramipril for his blood pressure. He has a history of hypertension, ischemic cardiomyopathy with the ejection fraction approximately 35%, left bundle branch block, acute anterior myocardial infarction in 2011 and status post  implantable cardiac defibrillator.  He denied problems with fever, chills, constipation or diarrhea. He is status post one cycle of systemic chemotherapy with carboplatin and paclitaxel with Neulasta support. He presents to proceed with cycle #2 of his systemic chemotherapy with carboplatin and paclitaxel with  Neulasta support.  He denied having any significant chest pain but has shortness breath with exertion and no cough or hemoptysis.   MEDICAL HISTORY: Past Medical History  Diagnosis Date  . Hypertension   . Ischemic cardiomyopathy Sept 2011    EF improved to 35-40% 2D  . COPD (chronic obstructive pulmonary disease)   . LBBB (left bundle branch block)   . Acute anterior myocardial infarction 2011    s/p PCI to LAD; 2.25 x 12 mm BMS  . ICD (implantable cardiac defibrillator) in place   . Anginal pain   . Sleep apnea     "while sleeping last week" (11/12/2012)  . Stroke 2009    "sometimes left side goes numb/weak/gives out" (11/12/2012)  . Pacemaker   . Shortness of breath     Hx: of with exertion   . Headache(784.0)   . Cancer     Lung cancer    ALLERGIES:  has No Known Allergies.  MEDICATIONS:  Current Facility-Administered Medications  Medication Dose Route Frequency Provider Last Rate Last Dose  . 0.9 %  sodium chloride infusion   Intravenous Continuous Lewis Keats E Abbegail Matuska, PA-C      . sodium chloride 0.9 % injection 10 mL  10 mL Intravenous PRN Conni Slipper, PA-C   10 mL at 03/31/13 1220   No current outpatient prescriptions on file.   Facility-Administered Medications Ordered in Other Visits  Medication Dose Route Frequency Provider Last Rate Last Dose  . acetaminophen (TYLENOL) tablet 650 mg  650 mg Oral Q6H PRN Vassie Loll, MD       Or  . acetaminophen (TYLENOL) suppository 650 mg  650 mg Rectal Q6H PRN Vassie Loll, MD      . aspirin EC tablet  81 mg  81 mg Oral Daily Vassie Loll, MD      . atorvastatin (LIPITOR) tablet 40 mg  40 mg Oral q1800 Vassie Loll, MD      . carvedilol (COREG) tablet 3.125 mg  3.125 mg Oral BID WC Vassie Loll, MD      . dextrose 5 %-0.45 % sodium chloride infusion   Intravenous Continuous Vassie Loll, MD      . enoxaparin (LOVENOX) injection 30 mg  30 mg Subcutaneous Q24H Gwen Her, [facility]      . feeding supplement (ENSURE  COMPLETE) liquid 237 mL  237 mL Oral TID BM Vassie Loll, MD      . mirtazapine (REMERON) tablet 15 mg  15 mg Oral QHS Vassie Loll, MD      . ondansetron [facility]) tablet 4 mg  4 mg Oral Q6H PRN Vassie Loll, MD       Or  . ondansetron [facility]) injection 4 mg  4 mg Intravenous Q6H PRN Vassie Loll, MD      . pantoprazole (PROTONIX) EC tablet 40 mg  40 mg Oral Q1200 Vassie Loll, MD        SURGICAL HISTORY:  Past Surgical History  Procedure Laterality Date  . Bi-ventricular implantable cardioverter defibrillator  (crt-d)  11/12/2012  . Coronary angioplasty with stent placement  02/12/2010    LAD BMS  . Insert / replace / remove pacemaker    . Portacath placement Right 02/24/2013    Procedure: INSERTION PORT-A-CATH;  Surgeon: Alleen Borne, MD;  Location: [facility] OR;  Service: Thoracic;  Laterality: Right;    REVIEW OF SYSTEMS:  A comprehensive review of systems was negative except for: Constitutional: positive for anorexia, fatigue, weight loss and Generalized weakness Respiratory: positive for dyspnea on exertion   PHYSICAL EXAMINATION: General appearance: alert, cooperative, appears older than stated age, cachectic, fatigued and no distress Head: Normocephalic, without obvious abnormality, atraumatic Neck: no adenopathy Lymph nodes: Cervical, supraclavicular, and axillary nodes normal. Resp: clear to auscultation bilaterally Cardio: regular rate and rhythm, S1, S2 normal, no murmur, click, rub or gallop GI: soft, non-tender; bowel sounds normal; no masses,  no organomegaly Extremities: extremities normal, atraumatic, no cyanosis or edema Neurologic: Grossly normal   ECOG PERFORMANCE STATUS: 2 - Symptomatic, <50% confined to bed  Blood pressure 92/46, pulse 87, temperature 97.8 F (36.6 C), temperature source Oral.  LABORATORY DATA: Lab Results  Component Value Date   WBC 7.8 03/31/2013   HGB 8.5* 03/31/2013   HCT 25.9* 03/31/2013   MCV 86.6 03/31/2013   PLT 288 03/31/2013       Chemistry      Component Value Date/Time   NA 137 03/31/2013 1024   NA 137 02/24/2013 0716   K 3.9 03/31/2013 1024   K 3.9 02/24/2013 0716   CL 94* 02/24/2013 0716   CO2 27 03/31/2013 1024   CO2 29 02/24/2013 0716   BUN 52.9* 03/31/2013 1024   BUN 51* 02/24/2013 0716   CREATININE 2.5* 03/31/2013 1024   CREATININE 2.51* 02/24/2013 0716      Component Value Date/Time   CALCIUM 9.3 03/31/2013 1024   CALCIUM 9.9 02/24/2013 0716   ALKPHOS 99 03/31/2013 1024   ALKPHOS 91 02/24/2013 0716   AST 30 03/31/2013 1024   AST 27 02/24/2013 0716   ALT 26 03/31/2013 1024   ALT 16 02/24/2013 0716   BILITOT 0.28 03/31/2013 1024   BILITOT 0.4 02/24/2013 0716       RADIOGRAPHIC STUDIES: Dg Chest 2  View Within Previous 72 Hours.  Films Obtained On Friday Are Acceptable For Monday And Tuesday Cases  02/24/2013   *RADIOLOGY REPORT*  Clinical Data: Preoperative evaluation for surgery.  Shortness of breath.  Cough.  CHEST - 2 VIEW  Comparison: Chest x-ray 01/11/2013.  Findings: Lung volumes are normal.  No acute consolidative airspace disease.  Small calcified granuloma in the periphery of the right upper lobe, and extensive left apical nodular pleural parenchymal thickening most compatible with scarring, unchanged.  No other definite suspicious-appearing pulmonary nodules or masses are identified on today's plain film examination.  No acute consolidative airspace disease.  No pleural effusions.  No evidence of pulmonary edema.  Heart size is normal.  Mediastinal contours are unremarkable.  Atherosclerosis in the thoracic aorta.  Left- sided biventricular pacemaker / AICD with lead tips projecting over the expected location of the right atrial appendage, right ventricular apex and overlying the lateral wall of the left ventricle via the coronary sinus and coronary veins.  IMPRESSION: 1.  No radiographic evidence of acute cardiopulmonary disease.  The appearance of chest is unchanged, as above. 2.  Known mediastinal and hilar adenopathy is  not well demonstrated on today's plain film examination.  See PET-CT 01/19/2013 for full details.   Original Report Authenticated By: Trudie Reed, M.D.   Ct Head Wo Contrast  02/22/2013   *RADIOLOGY REPORT*  Clinical Data: Non-small cell lung cancer  CT HEAD WITHOUT CONTRAST  Technique:  Contiguous axial images were obtained from the base of the skull through the vertex without contrast.  Comparison: CT head 10/10 facet  Findings: Creatinine  2.1.  Intravenous contrast not administered.  Mild atrophy.  Chronic infarct right thalamus related to hemorrhage on the prior study.  Chronic microvascular ischemic change in the white matter.  Negative for acute infarct.  Negative for acute hemorrhage or mass lesion.  Detection of metastatic disease is diminished without intravenous contrast.  IMPRESSION: Atrophy and chronic microvascular ischemic change.  No acute abnormality.  Negative for metastatic disease on unenhanced images.   Original Report Authenticated By: Janeece Riggers, M.D.   Dg Chest Port 1 View  02/24/2013   *RADIOLOGY REPORT*  Clinical Data: Right subclavian Port-A-Cath insertion of the  PORTABLE CHEST - 1 VIEW  Comparison: Chest x-ray of 02/24/2013  Findings: A right-sided Port-A-Cath is now present with the tip overlying the lower SVC a few centimeters above the expected SVC - RA junction.  No pneumothorax is seen.  Cardiomegaly is stable and a defibrillator remains.  IMPRESSION: Right-sided Port-A-Cath tip overlies the lower SVC.  No pneumothorax.   Original Report Authenticated By: Dwyane Dee, M.D.   Dg Fluoro Guide Cv Line-no Report  02/24/2013   CLINICAL DATA: surgery   FLOURO GUIDE CV LINE  Fluoroscopy was utilized by the requesting physician.  No radiographic  interpretation.     ASSESSMENT AND PLAN: This is a very pleasant 72 years old [place] male with history of metastatic non-small cell lung cancer, squamous cell carcinoma status post palliative radiotherapy to the manubrium. The  patient is status post one cycle of systemic chemotherapy with carboplatin and paclitaxel. Patient was discussed with and seen by Dr. Arbutus Ped. Patient is significantly hypotensive today with his blood pressures in the 70/30 range. This is likely multi-factorial, somewhat due in part to his poor by mouth intake as well as his multiple O. blood pressure medications. Due to his cardiac history and he may require her all these medications however the dosages may need to be  adjusted. The patient will be admitted to the hospital for further evaluation and management of his hypotension and weakness. His hemoglobin was also low at 8.5 and likely will be lower once he is rehydrated today. He may require packed red blood cell transfusion. This will be additional volume and will likely also put a bit of stress on his heart. After speaking with Dr. Gwenlyn Perking it was thought to be in the patient's best interest to admit him to the step down unit. Once he is stabilized and stronger he may resume chemotherapy. Dr. Shirline Frees will cysts the hospitalist team with the patient's care.   Laural Benes, Winton Offord E, PA-C   He was advised to call immediately if he has any concerning symptoms in the interval. The patient voices understanding of current disease status and treatment options and is in agreement with the current care plan.  All questions were answered. The patient knows to call the clinic with any problems, questions or concerns. We can certainly see the patient much sooner if necessary.  ADDENDUM: Hematology/Oncology Attending: I have face to face encounter with the patient today. I recommended his care plan. The patient presents today with significant weakness and fatigue as well as dehydration. His blood pressure was low in the range of 70/30. He has poor by mouth intake over the last few days. I recommended for the patient to be admitted to [facility] for further evaluation and IV hydration. I will follow him up  during his hospitalization. The patient and his family agreed to the current plan. We will hold his chemotherapy for now. Lajuana Matte., MD 04/02/2013

## 2013-03-31 NOTE — Telephone Encounter (Signed)
per pt in hospital

## 2013-03-31 NOTE — H&P (Signed)
[facility] History and Physical  Jabir Dahlem ZOX:[id number] DOB: [birth year] DOA: 03/31/2013  Referring physician: Dr. Arbutus Ped PCP: Runell Gess, MD  Specialists: Dr. Arbutus Ped (oncologist)  Chief Complaint: dehydration, hypotension; generalized weakness  HPI: [name] is a 72 y.o. male with PMH significant for lung cancer (on active chemotherapy); CAD (status post PCI to LAD), HTN, systolic heart failure (EF20-25%); AAA and HLD; came to hospital as direct admission from cancer center due to general malaise, weakness, decrease appetite, severe dehydration and hypotension (BP in low 80's). Patient also report some SOB on exertion. Patient denies CP, abdominal pain, nausea, vomiting, diarrhea, fever, chills or any other complaints. On my exam patient found to be dehydrated, positive orthostatic VS and just feeling weak. Discussion about transfusion and fluid resuscitation sustained with patient and family members; they have decline transfusion at this moment.  Will admit to Med-surg bed and follow clinical response. Cardiology was consulted for assistance with medication adjustment given hypotension and dizziness.  Review of Systems:  Negative except as mentioned on HPI.  Past Medical History  Diagnosis Date  . Hypertension   . Ischemic cardiomyopathy Sept 2011    EF improved to 35-40% 2D  . COPD (chronic obstructive pulmonary disease)   . LBBB (left bundle branch block)   . Acute anterior myocardial infarction 2011    s/p PCI to LAD; 2.25 x 12 mm BMS  . ICD (implantable cardiac defibrillator) in place   . Anginal pain   . Sleep apnea     "while sleeping last week" (11/12/2012)  . Stroke 2009    "sometimes left side goes numb/weak/gives out" (11/12/2012)  . Pacemaker   . Shortness of breath     Hx: of with exertion   . Headache(784.0)   . Cancer     Lung cancer   Past Surgical History  Procedure Laterality Date  . Bi-ventricular implantable cardioverter defibrillator   (crt-d)  11/12/2012  . Coronary angioplasty with stent placement  02/12/2010    LAD BMS  . Insert / replace / remove pacemaker    . Portacath placement Right 02/24/2013    Procedure: INSERTION PORT-A-CATH;  Surgeon: Alleen Borne, MD;  Location: [facility] OR;  Service: Thoracic;  Laterality: Right;   Social History:  reports that he quit smoking about 4 years ago. His smoking use included Cigarettes. He has a 22.5 pack-year smoking history. He has never used smokeless tobacco. He reports that he does not drink alcohol or use illicit drugs.  No Known Allergies  Family Hx: HTN, HLD; otherwise no contributory  Prior to Admission medications   Medication Sig Start Date End Date Taking? Authorizing Provider  carvedilol (COREG) 6.25 MG tablet Take 6.25 mg by mouth 2 (two) times daily with a meal.   Yes Historical Provider, MD  furosemide (LASIX) 40 MG tablet Take 40 mg by mouth 2 (two) times daily.   Yes Historical Provider, MD  ibuprofen (ADVIL,MOTRIN) 400 MG tablet Take 400 mg by mouth every 6 (six) hours as needed for pain.   Yes Historical Provider, MD  isosorbide dinitrate (ISORDIL) 10 MG tablet Take 10 mg by mouth 3 (three) times daily.  01/12/13  Yes Historical Provider, MD  lidocaine-prilocaine (EMLA) cream Apply topically as needed. 02/18/13  Yes Si Gaul, MD  mirtazapine (REMERON) 15 MG tablet Take 1 tablet (15 mg total) by mouth at bedtime. 03/10/13  Yes Si Gaul, MD  ramipril (ALTACE) 10 MG capsule Take 1 capsule (10 mg total) by mouth daily. 11/01/12  Yes Brittainy Simmons, PA-C  rosuvastatin (CRESTOR) 20 MG tablet Take 20 mg by mouth daily.   Yes Historical Provider, MD   Physical Exam: Filed Vitals:   03/31/13 1400  BP: 115/52  Pulse: 85  Resp: 23     General:  Afebrile, chachetic elderly man, NAD, frail and complaining of dizziness when changing positions  Eyes: PERRL, no icterus, no nystagmus  ENT: dry MM, no erythema, no exudates, poor dentition; no drainage out of  ears or nostrils  Neck: no JVD, no thyromegaly  Cardiovascular: s1 and S2, no rubs or gallops  Respiratory: CTA bilaterally  Abdomen: soft, NT, ND, positive BS; positive pulsatile mass  Skin: no rash or petechiae  Musculoskeletal: no edema, no erythema or cyanosis  Psychiatric: no SI, no hallucinations  Neurologic: AAOX3, CN intact, overall complaining of weakness; no focal sensory or motor deficit.  Labs on Admission:  Basic Metabolic Panel:  Recent Labs Lab 03/31/13 1024  NA 137  K 3.9  CO2 27  GLUCOSE 92  BUN 52.9*  CREATININE 2.5*  CALCIUM 9.3   Liver Function Tests:  Recent Labs Lab 03/31/13 1024  AST 30  ALT 26  ALKPHOS 99  BILITOT 0.28  PROT 7.9  ALBUMIN 2.9*   CBC:  Recent Labs Lab 03/31/13 1024  WBC 7.8  NEUTROABS 6.2  HGB 8.5*  HCT 25.9*  MCV 86.6  PLT 288   BNP (last 3 results)  Recent Labs  10/25/12 0353 10/26/12 0512 10/30/12 0520  PROBNP 11193.0* 10283.0* 1549.0*    Radiological Exams on Admission: No results found.  Assessment/Plan 1-Orthostasis: patient with positive orthostatic changes due to dehydration, given hx of poor oral intake and ongoing diuretic therapy. -will admit to med-surg bed -provide IVF resuscitation -encourage PO intake -will hold lasix, isordil and adjust coreg -will check cortisol level -will also check CXR -follow clinical response  2-HYPERTENSION: currently hypotensive. Will hold isordil and lasix. Coreg dose cut in half.  3-LOSS OF APPETITE: most likely due to chemotherapy side effects. 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  5-Dizziness and giddiness: due to dehydration and low Hgb. Patient currently refusing transfusion. -will provide IVF's resuscitation  6-Anemia of chronic  disease: most likely associated with malignancy and decrease response from bone marrow with ongoing chemotherapy -patient and family refusing transfusion at this moment. Will follow Hgb trend -no signs of acute bleeding.  7-CKD (chronic kidney disease) stage 3, GFR 30-59 ml/min: slightly worse; due to dehydration. -will check IVF's -will check UA  8-Protein-calorie malnutrition, severe: will start ensure and will follow rec's from nutrition service.  9-AAA: pulsatile mass felt on exam; no abd pain. Will follow ultrasound ordered by cardiology on 03/30/13  10-physical deconditioning: due to advance disease and poor nutrition. Will ask physical therapy to evaluate and treat  11-HLD: continue statins.  DVT: lovenox.   Cardiology [facility])  Code Status: DNR Family Communication: Daughter and interpreter at bedside. Disposition Plan: admit as inpatient, med-surg bed, LOS > 2 midnights  Time spent: 50 minutes  Luismario Coston [facility] Pager [phone]  If 7PM-7AM, please contact night-coverage [email or web address] Password [email or web address] 03/31/2013, 2:26 PM

## 2013-04-01 ENCOUNTER — Encounter (HOSPITAL_COMMUNITY): Payer: Self-pay | Admitting: *Deleted

## 2013-04-01 ENCOUNTER — Ambulatory Visit: Payer: Medicare PPO

## 2013-04-01 LAB — BASIC METABOLIC PANEL
BUN: 48 mg/dL — ABNORMAL HIGH (ref 6–23)
Creatinine, Ser: 2.13 mg/dL — ABNORMAL HIGH (ref 0.50–1.35)
GFR calc Af Amer: 34 mL/min — ABNORMAL LOW (ref >90)
GFR calc non Af Amer: 29 mL/min — ABNORMAL LOW (ref >90)
Potassium: 4.1 mEq/L (ref 3.5–5.1)

## 2013-04-01 LAB — CBC
HCT: 23.4 % — ABNORMAL LOW (ref 39.0–52.0)
MCHC: 33.3 g/dL (ref 30.0–36.0)
MCV: 86.3 fL (ref 78.0–100.0)
Platelets: 273 10*3/uL (ref 150–400)
RDW: 14.5 % (ref 11.5–15.5)

## 2013-04-01 MED ORDER — SODIUM CHLORIDE 0.9 % IV SOLN
INTRAVENOUS | Status: DC
  Administered 2013-04-01 – 2013-04-02 (×3): via INTRAVENOUS

## 2013-04-01 MED ORDER — RAMIPRIL 2.5 MG PO CAPS
2.5000 mg | ORAL_CAPSULE | Freq: Every day | ORAL | Status: DC
  Administered 2013-04-02: 2.5 mg via ORAL
  Filled 2013-04-01: qty 1

## 2013-04-01 NOTE — Progress Notes (Signed)
[facility] PROGRESS NOTE  [name] ZOX:[id number] DOB: 12/21/1940 DOA: 03/31/2013 PCP: [name] Gess, MD  Brief narrative: [name] is an 72 y.o. male with a PMH of ischemic cardiomyopathy, status post ST elevation MI, chronic systolic CHF status post biventricular ICD, and recently diagnosed squamous cell carcinoma of the lung with last chemotherapy 03/11/2013 was admitted on 03/31/2013 with dehydration, hypotension and generalized weakness. He has been seen by his cardiologist.  Assessment/Plan: Principal Problem:   Orthostasis -Positive orthostatic hypotension noted on admission. -Trigger likely secondary to poor oral intake and ongoing diuretic therapy. -Continue to hold Lasix, Isordil, and ACE inhibitor. -Cortisol level within the low normal range. Active Problems:   History of HYPERTENSION -Continue to hold Lasix, Isordil and ACE inhibitor for now.   LOSS OF APPETITE / severe protein calorie malnutrition -Likely from cancer related cachexia and prior chemotherapy. -Continue Ensure supplements. -Appears underweight with significant protein calorie malnutrition. Dietitian consultation requested.   CAD, STEMI July 2011 Rx'd with LAD BMS with moderate residual RCA/CFX disease. 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 Arbutus Ped notified of the patient's admission.   Dizziness and giddiness -Secondary to dehydration, anemia, and orthostasis. -Patient refuses blood transfusion. -Continue IV fluids. No complaints of dizziness today.   Anemia of chronic disease -Secondary to malignancy and the sequela of prior chemotherapy. Refuses transfusion.   CKD (chronic kidney disease) stage 3, GFR 30-59 ml/min -Creatinine slightly improved with IV fluids. Baseline creatinine appears to be around 2.0.    Chronic combined systolic and diastolic CHF, NYHA class 1 -Being followed by cardiology with adjustments to usual heart failure regimen noted.   Code Status: DNR Family Communication: Wife at bedside. Disposition Plan: Home when stable.   Medical Consultants:  Dr. Si Gaul, Oncology  Dr. Bryan Lemma, Cardiology  Other Consultants:  Dietitian  Anti-infectives:  None.  HPI/Subjective: [name] denies dizziness with ambulation today. Appetite is still fair. No nausea or vomiting. No complaints of pain.  Objective: Filed Vitals:   04/01/13 0516 04/01/13 1112 04/01/13 1115 04/01/13 1437  BP: 108/70 106/63 118/59 108/64  Pulse:   90 81  Temp:    99.1 F (37.3 C)  TempSrc:    Oral  Resp:    18  SpO2: 99%  98% 96%    Intake/Output Summary (Last 24 hours) at 04/01/13 1445 Last data filed at 04/01/13 1437  Gross per 24 hour  Intake      0 ml  Output    900 ml  Net   -900 ml    Exam: Gen:  NAD Cardiovascular:  RRR, No M/R/G Respiratory:  Lungs CTAB Gastrointestinal:  Abdomen soft, NT/ND, + BS Extremities:  + clubbing  Data Reviewed: Basic Metabolic Panel:  Recent Labs Lab 03/31/13 1024 03/31/13 1520 04/01/13 0150  NA 137  --  136  K 3.9  --  4.1  CL  --   --  100  CO2 27  --  28  GLUCOSE 92  --  95  BUN 52.9*  --  48*  CREATININE 2.5* 2.16* 2.13*  CALCIUM 9.3  --  8.6  MG  --  2.2  --   PHOS  --  3.1  --    GFR The CrCl is unknown because both a height and weight (above a minimum accepted value) are required for  this calculation. Liver Function Tests:  Recent Labs Lab 03/31/13 1024  AST 30  ALT 26  ALKPHOS 99  BILITOT 0.28  PROT 7.9  ALBUMIN 2.9*   CBC:  Recent Labs Lab 03/31/13 1024 03/31/13 1520 04/01/13 0150  WBC 7.8 8.9 7.6  NEUTROABS 6.2  --   --   HGB 8.5* 8.2* 7.8*  HCT 25.9* 25.0* 23.4*  MCV 86.6 87.4 86.3  PLT 288 265 273   Cardiac Enzymes:  Recent Labs Lab 03/31/13 1520 03/31/13 2005 04/01/13 0150   TROPONINI <0.30 <0.30 <0.30   BNP (last 3 results)  Recent Labs  10/25/12 0353 10/26/12 0512 10/30/12 0520  PROBNP 11193.0* 10283.0* 1549.0*    Microbiology Recent Results (from the past 240 hour(s))  MRSA PCR SCREENING     Status: None   Collection Time    03/31/13  1:00 PM      Result Value Range Status   MRSA by PCR NEGATIVE  NEGATIVE Final   Comment:            The GeneXpert MRSA Assay (FDA     approved for NASAL specimens     only), is one component of a     comprehensive MRSA colonization     surveillance program. It is not     intended to diagnose MRSA     infection nor to guide or     monitor treatment for     MRSA infections.     Procedures and Diagnostic Studies: Dg Chest 1 View  03/31/2013   *RADIOLOGY REPORT*  Clinical Data: Hypertension, shortness of breath on exertion, history of lung cancer and CHF  CHEST - 1 VIEW  Comparison: 02/24/2013; PET CT - 01/19/2013; chest CT - 01/11/2013  Findings:  Grossly unchanged cardiac silhouette and mediastinal contours with mild ectasia of the thoracic aorta.  Stable positioning of support apparatus.  The lungs remain hyperexpanded.  No focal airspace opacity.  No pleural effusion or pneumothorax.  No definite evidence of edema.  Unchanged bones.  IMPRESSION: Hyperexpanded lungs without acute cardiopulmonary disease.   Original Report Authenticated By: Tacey Ruiz, MD    Scheduled Meds: . aspirin EC  81 mg Oral Daily  . atorvastatin  40 mg Oral q1800  . carvedilol  3.125 mg Oral BID WC  . enoxaparin (LOVENOX) injection  30 mg Subcutaneous Q24H  . feeding supplement  237 mL Oral TID BM  . mirtazapine  15 mg Oral QHS  . pantoprazole  40 mg Oral Q1200   Continuous Infusions: . sodium chloride 100 mL/hr at 04/01/13 1342    Time spent: 25 minutes.   LOS: 1 day   RAMA,CHRISTINA  [facility] Pager [phone].   *Please note that the hospitalists switch teams on Wednesdays. Please call the flow manager at  [phone] if you are having difficulty reaching the hospitalist taking care of this patient as she can update you and provide the most up-to-date pager number of provider caring for the patient. If 8PM-8AM, please contact night-coverage at [email or web address], password [email or web address]  04/01/2013, 2:45 PM

## 2013-04-01 NOTE — Progress Notes (Signed)
Subjective: No apparent complaints  Objective: Vital signs in last 24 hours: Temp:  [97.9 F (36.6 C)-99.6 F (37.6 C)] 98.4 F (36.9 C) (09/11 0510) Pulse Rate:  [45-90] 90 (09/11 1115) Resp:  [18-23] 18 (09/11 0510) BP: (102-130)/(42-82) 118/59 mmHg (09/11 1115) SpO2:  [65 %-100 %] 98 % (09/11 1115) Weight change:  Last BM Date: 03/30/13 Intake/Output from previous day: -575 09/10 0701 - 09/11 0700 In: -  Out: 575 [Urine:575] Intake/Output this shift: Total I/O In: -  Out: 225 [Urine:225]  PE: General:Pleasant affect, NAD Skin:Warm and dry, brisk capillary refill HEENT:normocephalic, sclera clear, mucus membranes moist Heart:S1S2 RRR without murmur, gallup, rub or click Lungs:clear ant,  without rales, rhonchi, or wheezes OZH:YQMV, non tender, + BS, do not palpate liver spleen or masses Ext:no lower ext edema Neuro:alert and oriented, MAE, follows commands, + facial symmetry   Lab Results:  Recent Labs  03/31/13 1520 04/01/13 0150  WBC 8.9 7.6  HGB 8.2* 7.8*  HCT 25.0* 23.4*  PLT 265 273   BMET  Recent Labs  03/31/13 1024 03/31/13 1520 04/01/13 0150  NA 137  --  136  K 3.9  --  4.1  CL  --   --  100  CO2 27  --  28  GLUCOSE 92  --  95  BUN 52.9*  --  48*  CREATININE 2.5* 2.16* 2.13*  CALCIUM 9.3  --  8.6    Recent Labs  03/31/13 2005 04/01/13 0150  TROPONINI <0.30 <0.30    Lab Results  Component Value Date   CHOL 210* 03/14/2012   HDL 52 03/14/2012   LDLCALC 135* 03/14/2012   TRIG 113 03/14/2012   CHOLHDL 4.0 03/14/2012   Lab Results  Component Value Date   HGBA1C  Value: 5.6 (NOTE)                                                                       According to the ADA Clinical Practice Recommendations for 2011, when HbA1c is used as a screening test:   >=6.5%   Diagnostic of Diabetes Mellitus           (if abnormal result  is confirmed)  5.7-6.4%   Increased risk of developing Diabetes Mellitus  References:Diagnosis and  Classification of Diabetes Mellitus,Diabetes Care,2011,34(Suppl 1):S62-S69 and Standards of Medical Care in         Diabetes - 2011,Diabetes Care,2011,34  (Suppl 1):S11-S61. 02/12/2010     Lab Results  Component Value Date   TSH 1.764 10/26/2012    Hepatic Function Panel  Recent Labs  03/31/13 1024  PROT 7.9  ALBUMIN 2.9*  AST 30  ALT 26  ALKPHOS 99  BILITOT 0.28   No results found for this basename: CHOL,  in the last 72 hours No results found for this basename: PROTIME,  in the last 72 hours    EKG: Orders placed during the hospital encounter of 01/11/13  . EKG 12-LEAD  . EKG 12-LEAD  . EKG    Studies/Results: Dg Chest 1 View  03/31/2013   *RADIOLOGY REPORT*  Clinical Data: Hypertension, shortness of breath on exertion, history of lung cancer and CHF  CHEST - 1 VIEW  Comparison: 02/24/2013; PET CT -  01/19/2013; chest CT - 01/11/2013  Findings:  Grossly unchanged cardiac silhouette and mediastinal contours with mild ectasia of the thoracic aorta.  Stable positioning of support apparatus.  The lungs remain hyperexpanded.  No focal airspace opacity.  No pleural effusion or pneumothorax.  No definite evidence of edema.  Unchanged bones.  IMPRESSION: Hyperexpanded lungs without acute cardiopulmonary disease.   Original Report Authenticated By: Tacey Ruiz, MD    Medications: I have reviewed the patient's current medications. Scheduled Meds: . aspirin EC  81 mg Oral Daily  . atorvastatin  40 mg Oral q1800  . carvedilol  3.125 mg Oral BID WC  . enoxaparin (LOVENOX) injection  30 mg Subcutaneous Q24H  . feeding supplement  237 mL Oral TID BM  . mirtazapine  15 mg Oral QHS  . pantoprazole  40 mg Oral Q1200   Continuous Infusions: . sodium chloride 100 mL/hr at 04/01/13 1342   PRN Meds:.acetaminophen, acetaminophen, ondansetron (ZOFRAN) IV, ondansetron  Assessment/Plan: Principal Problem:   Orthostasis Active Problems:   HYPERTENSION   LOSS OF APPETITE   CAD, STEMI July  2011 Rx'd with LAD BMS with moderate residual RCA/CFX disease. 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  Would continue to hold ACE, isordil and furosemide for now.      LOS: 1 day   Time spent with pt. :10 minutes. [facility] R  Nurse Practitioner Certified Pager [phone] 04/01/2013, 1:22 PM   I have seen and examined the patient along with Nada Boozer, NP.  I have reviewed the chart, notes and new data.  I agree with NP's note.  This afternoon his BP is substantially better and he appears euvolemic. Renal function similar to values from June and August. Resume low dose ACEi in AM.  Weights have not been recorded - cannot use them to guide diuretic therapy, but would hold off furosemide for now.   Thurmon Fair, MD, [facility] [phone] 04/01/2013, 5:01 PM

## 2013-04-01 NOTE — Evaluation (Signed)
Physical Therapy Evaluation Patient Details Name: [name] MRN: [id number] DOB: 08-14-1940 Today's Date: 04/01/2013 Time: 1478-2956 PT Time Calculation (min): 22 min  PT Assessment / Plan / Recommendation History of Present Illness  72 y.o. [place] male with h/o lung cancer for which he is receiving chemotherapy admitted with dehydration, falls, anemia, weakness. Pt is refusing blood transfusion.   Clinical Impression  **Communicated with pt via translator. He reported dizziness at home comes and goes, that he had 4 falls at home. Denies dizziness today with walking. Pt ambulated 300' without assistive device with no loss of balance. BP in sitting 106/63, in standing 118/59. If dizziness remains resolved, pt would be ok to DC home from PT standpoint. He states family works during the day so he'd be home alone. HHPT recommended for safety eval. *    PT Assessment  Patient needs continued PT services    Follow Up Recommendations  Home health PT    Does the patient have the potential to tolerate intense rehabilitation      Barriers to Discharge Decreased caregiver support family works during day, if pt's dizziness is completely resolved by time of DC he should be fine to be alone at home    Equipment Recommendations  Rolling walker with 5" wheels    Recommendations for Other Services     Frequency Min 3X/week    Precautions / Restrictions Precautions Precautions: Fall Precaution Comments: 4 falls Prior to admission   Pertinent Vitals/Pain *BP sitting 106/63 Standing 118/59 Pt denied dizziness 0/10 pain**      Mobility  Bed Mobility Bed Mobility: Supine to Sit Supine to Sit: 7: Independent Transfers Transfers: Sit to Stand;Stand to Sit Sit to Stand: 4: Min guard Stand to Sit: 4: Min guard Details for Transfer Assistance: min/guard 2* h/o recent falls Ambulation/Gait Ambulation/Gait Assistance: 4: Min guard Ambulation Distance (Feet): 300 Feet Assistive device:  None Ambulation/Gait Assistance Details: min/guard 2* h/o multiple falls recently, possibly due to dehydration as pt is now reporting he is not dizzy in standing and had no drop in BP in standing Gait Pattern: Within Functional Limits Gait velocity: no LOB, pt denied dizziness with walking    Exercises     PT Diagnosis: Generalized weakness  PT Problem List: Decreased activity tolerance;Cardiopulmonary status limiting activity PT Treatment Interventions: Gait training;Therapeutic exercise;Balance training     PT Goals(Current goals can be found in the care plan section) Acute Rehab PT Goals Patient Stated Goal: to stop feeling sick, increase appetite PT Goal Formulation: With patient Time For Goal Achievement: 04/15/13 Potential to Achieve Goals: Good  Visit Information  Last PT Received On: 04/01/13 Assistance Needed: +1 History of Present Illness: 72 y.o. [place] male with h/o lung cancer for which he is receiving chemotherapy admitted with dehydration, falls, anemia, weakness. Pt is refusing blood transfusion.        Prior Functioning  Home Living Family/patient expects to be discharged to:: Private residence Living Arrangements: Spouse/significant other;Children Available Help at Discharge: Available PRN/intermittently;Family (spouse and children work during day) Home Layout: One level Home Equipment: Medical laboratory scientific officer - quad Prior Function Level of Independence: Independent with assistive device(s) Comments: used quad cane Communication Communication: Prefers language other than English ([place]    Cognition  Cognition Arousal/Alertness: Awake/alert Behavior During Therapy: WFL for tasks assessed/performed Overall Cognitive Status: Within Functional Limits for tasks assessed    Extremity/Trunk Assessment Upper Extremity Assessment Upper Extremity Assessment: Overall WFL for tasks assessed Lower Extremity Assessment Lower Extremity Assessment: Overall WFL for tasks  assessed Cervical / Trunk Assessment Cervical / Trunk Assessment: Normal   Balance Static Sitting Balance Static Sitting - Balance Support: No upper extremity supported;Feet supported Static Sitting - Level of Assistance: 7: Independent Static Sitting - Comment/# of Minutes: 4 minutes, steady  End of Session PT - End of Session Equipment Utilized During Treatment: Gait belt Activity Tolerance: Patient tolerated treatment well Patient left: in bed Nurse Communication: Mobility status  GP     [name] 04/01/2013, 11:43 AM [phone]

## 2013-04-01 NOTE — Progress Notes (Signed)
This RN asked Chelsea Aus, a [facility] pharmacist, to accompany me on my assessment since patient is non-English speaking as a Nurse, learning disability. Pt had no complaints and was very appreciative to have someone that he could speak with and understood his language. Pt also had no complaints of dizziness this morning with ambulation to restroom. Will continue to monitor patient throughout shift. Angelena Form, RN

## 2013-04-01 NOTE — Progress Notes (Signed)
INITIAL NUTRITION ASSESSMENT  Pt meets criteria for severe MALNUTRITION in the context of chronic illness as evidenced by <75% estimated energy intake in the past 1-2 months in addition to pt with severe muscle wasting and subcutaneous fat loss in temporal/orbital region.  DOCUMENTATION CODES Per approved criteria  -Severe malnutrition in the context of chronic illness   INTERVENTION: - Recommend nursing get updated weight for pt for this admission - Ensure Complete TID - Encouraged pt to continue to consume 100% of meals - Will continue to monitor   NUTRITION DIAGNOSIS: Increased nutrient needs related to metastatic non-small cell lung CA with recent chemotherapy as evidenced by MD notes.   Goal: Pt to consume >90% of meals/supplements  Monitor:  Weights, labs, intake  Reason for Assessment: Consult   72 y.o. male  Admitting Dx: Orthostasis  ASSESSMENT: Pt recently diagnosed with metastatic non-small cell lung CA in June 2014 s/p palliative radiotherapy and systemic chemotherapy. Pt has been seen by outpatient [facility] RD. Met with pt who reports drinking 1 Ensure/day at home and eating 1-2 meals/day. Reports weight has been around 103-105 pounds, last weight documented was 108 pounds the end of last month. Observed that had consumed all of lunch tray. Pt cachetic with severe muscle wasting and subcutaneous fat loss in orbital/temporal region, pt declined wanting full nutrition focused physical exam. Denies any nausea.   Height: Ht Readings from Last 1 Encounters:  03/17/13 5\' 7"  (1.702 m)    Weight: Wt Readings from Last 1 Encounters:  03/10/13 108 lb (48.988 kg)    Ideal Body Weight: 148 lb  % Ideal Body Weight: 73%  Wt Readings from Last 10 Encounters:  03/10/13 108 lb (48.988 kg)  03/03/13 111 lb 4.8 oz (50.485 kg)  02/26/13 111 lb (50.349 kg)  02/23/13 110 lb (49.896 kg)  02/23/13 110 lb (49.896 kg)  02/19/13 114 lb 6.4 oz (51.891 kg)   02/19/13 113 lb 3.2 oz (51.347 kg)  02/18/13 114 lb 9.6 oz (51.982 kg)  02/15/13 114 lb 12.8 oz (52.073 kg)  02/12/13 114 lb 8 oz (51.937 kg)    Usual Body Weight: 103-105 lb per pt report  % Usual Body Weight: 103-105%  BMI:  16.9 kg/(m^2) based on weight at the end of August 2014  Estimated Nutritional Needs Based on most recent weight: Kcal: 1500-1700 Protein: 75-95g Fluid: 1.5-1.7L/day  Skin: Intact   Diet Order: General  EDUCATION NEEDS: -No education needs identified at this time   Intake/Output Summary (Last 24 hours) at 04/01/13 1527 Last data filed at 04/01/13 1437  Gross per 24 hour  Intake      0 ml  Output    900 ml  Net   -900 ml    Last BM: 9/9  Labs:   Recent Labs Lab 03/31/13 1024 03/31/13 1520 04/01/13 0150  NA 137  --  136  K 3.9  --  4.1  CL  --   --  100  CO2 27  --  28  BUN 52.9*  --  48*  CREATININE 2.5* 2.16* 2.13*  CALCIUM 9.3  --  8.6  MG  --  2.2  --   PHOS  --  3.1  --   GLUCOSE 92  --  95    CBG (last 3)  No results found for this basename: GLUCAP,  in the last 72 hours  Scheduled Meds: . aspirin EC  81 mg Oral Daily  . atorvastatin  40 mg Oral  q1800  . carvedilol  3.125 mg Oral BID WC  . enoxaparin (LOVENOX) injection  30 mg Subcutaneous Q24H  . feeding supplement  237 mL Oral TID BM  . mirtazapine  15 mg Oral QHS  . pantoprazole  40 mg Oral Q1200    Continuous Infusions: . sodium chloride 100 mL/hr at 04/01/13 1342    Past Medical History  Diagnosis Date  . Hypertension   . Ischemic cardiomyopathy Sept 2011    EF improved to 35-40% 2D  . COPD (chronic obstructive pulmonary disease)   . LBBB (left bundle branch block)   . Acute anterior myocardial infarction 2011    s/p PCI to LAD; 2.25 x 12 mm BMS  . ICD (implantable cardiac defibrillator) in place   . Anginal pain   . Sleep apnea     "while sleeping last week" (11/12/2012)  . Stroke 2009    "sometimes left side goes numb/weak/gives out" (11/12/2012)   . Pacemaker   . Shortness of breath     Hx: of with exertion   . Headache(784.0)   . Cancer     Lung cancer    Past Surgical History  Procedure Laterality Date  . Bi-ventricular implantable cardioverter defibrillator  (crt-d)  11/12/2012  . Coronary angioplasty with stent placement  02/12/2010    LAD BMS  . Insert / replace / remove pacemaker    . Portacath placement Right 02/24/2013    Procedure: INSERTION PORT-A-CATH;  Surgeon: Alleen Borne, MD;  Location: [facility] OR;  Service: Thoracic;  Laterality: Right;    Levon Hedger MS, RD, LDN [phone] Pager [phone] After Hours Pager

## 2013-04-02 MED ORDER — CARVEDILOL 6.25 MG PO TABS: 3.1250 mg | ORAL_TABLET | Freq: Two times a day (BID) | ORAL | Status: DC

## 2013-04-02 MED ORDER — ASPIRIN 81 MG PO TBEC: 81.0000 mg | DELAYED_RELEASE_TABLET | Freq: Every day | ORAL | Status: DC

## 2013-04-02 MED ORDER — RAMIPRIL 2.5 MG PO CAPS: 2.5000 mg | ORAL_CAPSULE | Freq: Every day | ORAL | Status: DC

## 2013-04-02 MED ORDER — OXYCODONE-ACETAMINOPHEN 5-325 MG PO TABS: 1.0000 | ORAL_TABLET | ORAL | Status: DC | PRN

## 2013-04-02 NOTE — Discharge Summary (Addendum)
Physician Discharge Summary  [name] ZOX:[id number] DOB: [birth year] DOA: 03/31/2013  PCP: [name] Gess, MD  Admit date: 03/31/2013 Discharge date: 04/02/2013  Recommendations for Outpatient Follow-up:  1. The patient is being discharged with the medication changes as noted below.  He will need close follow up to ensure he does not need his medication further adjustment.  A [place] interpreter was used to communicate the medication changes to him.  Discharge Diagnoses:  Principal Problem:    Orthostasis Active Problems:    HYPERTENSION    LOSS OF APPETITE    CAD, STEMI July 2011 Rx'd with LAD BMS with moderate residual RCA/CFX disease. 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  Diet recommendation: Low sodium, heart healthy.  History of present illness:  [name] is an 72 y.o. male with a PMH of ischemic cardiomyopathy, status post ST elevation MI, chronic systolic CHF status post biventricular ICD, and recently diagnosed squamous cell carcinoma of the lung with last chemotherapy 03/11/2013 was admitted on 03/31/2013 with dehydration, hypotension and generalized weakness. He has been seen by his cardiologist.  Hospital Course by problem:  Principal Problem:  Orthostasis  -Positive orthostatic hypotension noted on admission.  -Trigger thought to be poor oral intake in the face of ongoing diuretic therapy.  -Lasix, Isordil, and ACE inhibitor held on admission. Can resume ACE-I at discharge but continue to hold others. -Patient instructed to  resume lasix if he should experience trouble breathing or a 2 pound weight gain/24 hours. Active Problems:  History of HYPERTENSION  -D/C BP stable.  Continue to hold Lasix and Isordil.  OK to resume ACE inhibitor.  LOSS OF APPETITE / severe protein calorie malnutrition  -Likely from [name] related cachexia and prior chemotherapy.  -Continue Ensure supplements.  -Appears underweight with significant protein calorie malnutrition. Dietitian consultation done 04/01/13.  CAD, STEMI July 2011 Rx'd with LAD BMS with moderate residual RCA/CFX disease. Cath 8/13- patent LAD/ Ischemic cardiomyopathy, EF 25-30% with STEMI, improved to 35-40% 2D Sept 2011  -Seen by cardiology.  Continue ASA.  Recommend close F/U post discharge. -Continue statin. -Troponins negative x 3. 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 Arbutus [name] saw patient 04/02/13, cleared for D/C. Dizziness and giddiness  -Secondary to dehydration, anemia, and orthostasis.  -Patient refused blood transfusion.  -Treated with IV fluids. No complaints of dizziness for 24 hours prior to discharge.  Anemia of chronic disease  -Secondary to malignancy and the sequela of prior chemotherapy. Refuses transfusion.  CKD (chronic kidney disease) stage 3, GFR 30-59 ml/min  -Creatinine slightly improved with IV fluids. Baseline creatinine appears to be around 2.0.  Chronic combined systolic and diastolic CHF, NYHA class 1  -Being followed by cardiology with adjustments to usual heart failure regimen noted.   Procedures:  None.  Medical Consultants:  Dr. Si [name], [name]  Dr. Bryan [name], Cardiology   Discharge Exam: Filed Vitals:   04/02/13 0815  BP: 121/73  Pulse:   Temp:   Resp:    Filed  Vitals:   04/02/13 0812 04/02/13 0814 04/02/13 0815 04/02/13 0900  BP: 134/73 120/65 121/73   Pulse: 81     Temp: 98.2 F (36.8 C)     TempSrc: Oral      Resp: 18     Height:    5\' 7"  (1.702 m)  Weight:    50.758 kg (111 lb 14.4 oz)  SpO2: 100%       Gen:  NAD Cardiovascular:  RRR, No M/R/G Respiratory: Lungs CTAB Gastrointestinal: Abdomen soft, NT/ND with normal active bowel sounds. Extremities: + clubbing.   Discharge Instructions      Discharge Orders   Future Appointments Provider Department Dept Phone   04/07/2013 10:30 AM [name] [place] [name] [phone]   04/14/2013 11:45 AM [name] [name] [name] [phone]   04/21/2013 10:45 AM [name] [name] [facility] [name] [phone]   04/21/2013 11:15 AM [name] [address] [place] [name] [phone]   04/21/2013 12:00 PM [name], RD [name] MED[facility] [name] [phone]   04/22/2013 10:30 AM [name] [place] [name] [phone]   04/28/2013 10:45 AM [name] [facility] [name] [phone]   05/05/2013 10:45 AM [name] [name] [name] [phone]   05/12/2013 10:45 AM [name] [place] [name] [phone]   05/13/2013 10:30 AM [name] [place] [name] [phone]   Future Orders Complete By Expires   (HEART FAILURE PATIENTS) Call MD:  Anytime you have any of the following symptoms: 1) 3 pound weight gain in 24 hours or 5 pounds in 1 week 2) shortness of breath, with or without a dry hacking cough 3) swelling in the hands, feet or stomach 4) if you have to sleep on extra pillows at night in order to breathe.  As directed    Diet - low sodium heart healthy  As directed    Discharge instructions  As directed    Comments:     You were cared for by Dr. Hillery [name]  (a hospitalist) during your hospital stay. If you have any questions about your  discharge medications or the care you received while you were in the hospital after you are discharged, you can call the unit and ask to speak with the hospitalist on call if the hospitalist that took care of you is not available. Once you are discharged, your primary care physician will handle any further medical issues. Please note that NO REFILLS for any discharge medications will be authorized once you are discharged, as it is imperative that you return to your primary care physician (or establish a relationship with a primary care physician if you do not have one) for your aftercare needs so that they can reassess your need for medications and monitor your lab values.  Any outstanding tests can be reviewed by your PCP at your follow up visit.  It is also important to review any medicine changes with your PCP.  Please bring these d/c instructions with you to your next visit so your physician can review these changes with you.  If you do not have a primary care physician, you can call [phone] for a physician referral.  It is highly recommended that you obtain a PCP for hospital follow up.   Increase activity slowly  As directed  Medication List    STOP taking these medications       furosemide 40 MG tablet  Commonly known as:  LASIX     ibuprofen 400 MG tablet  Commonly known as:  ADVIL,MOTRIN     isosorbide dinitrate 10 MG tablet  Commonly known as:  ISORDIL      TAKE these medications       aspirin 81 MG EC tablet  Take 1 tablet (81 mg total) by mouth daily.     carvedilol 6.25 MG tablet  Commonly known as:  COREG  Take 0.5 tablets (3.125 mg total) by mouth 2 (two) times daily with a meal.     lidocaine-prilocaine cream  Commonly known as:  EMLA  Apply topically as needed.     mirtazapine 15 MG tablet  Commonly known as:  REMERON  Take 1 tablet (15 mg total) by mouth at bedtime.     oxyCODONE-acetaminophen 5-325 MG per tablet  Commonly known as:  ROXICET  Take 1  tablet by mouth every 4 (four) hours as needed for pain.     ramipril 2.5 MG capsule  Commonly known as:  ALTACE  Take 1 capsule (2.5 mg total) by mouth daily.     rosuvastatin 20 MG tablet  Commonly known as:  CRESTOR  Take 20 mg by mouth daily.       Follow-up Information   Follow up with [name] Gess, MD. Schedule an appointment as soon as possible for a visit in 1 week.   Specialty:  Cardiology   Contact information:   [address] [place] [address] [phone]       Follow up with Lajuana Matte., MD. Schedule an appointment as soon as possible for a visit in 1 week.   Specialty:  [name]   Contact information:   [address] [place] [address] [phone]        The results of significant diagnostics from this hospitalization (including imaging, microbiology, ancillary and laboratory) are listed below for reference.    Significant Diagnostic Studies: Dg Chest 1 View  03/31/2013   *RADIOLOGY REPORT*  Clinical Data: Hypertension, shortness of breath on exertion, history of lung [name] and CHF  CHEST - 1 VIEW  Comparison: 02/24/2013; PET CT - 01/19/2013; chest CT - 01/11/2013  Findings:  Grossly unchanged cardiac silhouette and mediastinal contours with mild ectasia of the thoracic aorta.  Stable positioning of support apparatus.  The lungs remain hyperexpanded.  No focal airspace opacity.  No pleural effusion or pneumothorax.  No definite evidence of edema.  Unchanged bones.  IMPRESSION: Hyperexpanded lungs without acute cardiopulmonary disease.   Original Report Authenticated By: Tacey Ruiz, MD    Labs:  Basic Metabolic Panel:  Recent Labs Lab 03/31/13 1024 03/31/13 1520 04/01/13 0150  NA 137  --  136  K 3.9  --  4.1  CL  --   --  100  CO2 27  --  28  GLUCOSE 92  --  95  BUN 52.9*  --  48*  CREATININE 2.5* 2.16* 2.13*  CALCIUM 9.3  --  8.6  MG  --  2.2  --   PHOS  --  3.1  --    GFR Estimated Creatinine Clearance:  22.2 ml/min (by C-G formula based on Cr of 2.13). Liver Function Tests:  Recent Labs Lab 03/31/13 1024  AST 30  ALT 26  ALKPHOS 99  BILITOT 0.28  PROT 7.9  ALBUMIN 2.9*  CBC:  Recent Labs Lab 03/31/13 1024 03/31/13 1520 04/01/13 0150  WBC 7.8 8.9 7.6  NEUTROABS 6.2  --   --   HGB 8.5* 8.2* 7.8*  HCT 25.9* 25.0* 23.4*  MCV 86.6 87.4 86.3  PLT 288 265 273   Cardiac Enzymes:  Recent Labs Lab 03/31/13 1520 03/31/13 2005 04/01/13 0150  TROPONINI <0.30 <0.30 <0.30   Microbiology Recent Results (from the past 240 hour(s))  MRSA PCR SCREENING     Status: None   Collection Time    03/31/13  1:00 PM      Result Value Range Status   MRSA by PCR NEGATIVE  NEGATIVE Final   Comment:            The GeneXpert MRSA Assay (FDA     approved for NASAL specimens     [name]), is one component of a     comprehensive MRSA colonization     surveillance program. It is not     intended to diagnose MRSA     infection nor to guide or     monitor treatment for     MRSA infections.    Time coordinating discharge: 35 minutes.  Signed:  RAMA,CHRISTINA  Pager [phone] [facility] 04/02/2013, 1:46 PM

## 2013-04-02 NOTE — Progress Notes (Signed)
Patient discharged per MD order. Discharge instructions reviewed with patient, wife, and one daughter at bedside. MD used interpreter phone services to discuss medication changes and discharge information. This RN asked again if family had any questions (daughter speaks [place]). Family stated all questions answered by Dr. Darnelle Catalan. Pt wheeled to the car for discharge home by student RN. Angelena Form, RN

## 2013-04-07 ENCOUNTER — Other Ambulatory Visit (HOSPITAL_BASED_OUTPATIENT_CLINIC_OR_DEPARTMENT_OTHER): Payer: Medicare PPO

## 2013-04-07 DIAGNOSIS — C3490 Malignant neoplasm of unspecified part of unspecified bronchus or lung: Secondary | ICD-10-CM

## 2013-04-07 LAB — COMPREHENSIVE METABOLIC PANEL (CC13)
ALT: 22 U/L (ref 0–55)
AST: 25 U/L (ref 5–34)
Alkaline Phosphatase: 90 U/L (ref 40–150)
Sodium: 141 mEq/L (ref 136–145)
Total Bilirubin: 0.25 mg/dL (ref 0.20–1.20)
Total Protein: 7.6 g/dL (ref 6.4–8.3)

## 2013-04-07 LAB — CBC WITH DIFFERENTIAL/PLATELET
BASO%: 0.5 % (ref 0.0–2.0)
EOS%: 2.6 % (ref 0.0–7.0)
LYMPH%: 5.8 % — ABNORMAL LOW (ref 14.0–49.0)
MCHC: 32.6 g/dL (ref 32.0–36.0)
MCV: 88.1 fL (ref 79.3–98.0)
MONO%: 9 % (ref 0.0–14.0)
Platelets: 331 10*3/uL (ref 140–400)
RBC: 3.12 10*6/uL — ABNORMAL LOW (ref 4.20–5.82)

## 2013-04-09 ENCOUNTER — Other Ambulatory Visit: Payer: Self-pay | Admitting: Internal Medicine

## 2013-04-13 ENCOUNTER — Encounter: Payer: Self-pay | Admitting: *Deleted

## 2013-04-13 ENCOUNTER — Telehealth: Payer: Self-pay | Admitting: *Deleted

## 2013-04-13 ENCOUNTER — Telehealth: Payer: Self-pay | Admitting: Internal Medicine

## 2013-04-13 NOTE — Telephone Encounter (Signed)
Per staff message and POF I have scheduled appts.  JMW  

## 2013-04-13 NOTE — Telephone Encounter (Signed)
Sent michelle a staff message to add the pt in for a chemo appt for 04/14/2013.

## 2013-04-14 ENCOUNTER — Ambulatory Visit: Payer: Medicare PPO | Admitting: Physician Assistant

## 2013-04-14 ENCOUNTER — Other Ambulatory Visit (HOSPITAL_BASED_OUTPATIENT_CLINIC_OR_DEPARTMENT_OTHER): Payer: Medicare PPO | Admitting: Lab

## 2013-04-14 ENCOUNTER — Ambulatory Visit (HOSPITAL_BASED_OUTPATIENT_CLINIC_OR_DEPARTMENT_OTHER): Payer: Medicare PPO | Admitting: Internal Medicine

## 2013-04-14 ENCOUNTER — Telehealth: Payer: Self-pay | Admitting: Internal Medicine

## 2013-04-14 ENCOUNTER — Encounter: Payer: Self-pay | Admitting: Internal Medicine

## 2013-04-14 ENCOUNTER — Other Ambulatory Visit: Payer: Medicare PPO | Admitting: Lab

## 2013-04-14 VITALS — BP 131/74 | HR 82 | Temp 97.5°F | Resp 18 | Ht 67.0 in | Wt 108.7 lb

## 2013-04-14 DIAGNOSIS — C771 Secondary and unspecified malignant neoplasm of intrathoracic lymph nodes: Secondary | ICD-10-CM

## 2013-04-14 DIAGNOSIS — C3490 Malignant neoplasm of unspecified part of unspecified bronchus or lung: Secondary | ICD-10-CM

## 2013-04-14 LAB — COMPREHENSIVE METABOLIC PANEL (CC13)
Alkaline Phosphatase: 84 U/L (ref 40–150)
BUN: 22 mg/dL (ref 7.0–26.0)
Glucose: 121 mg/dl (ref 70–140)
Sodium: 142 mEq/L (ref 136–145)
Total Bilirubin: 0.23 mg/dL (ref 0.20–1.20)
Total Protein: 8.1 g/dL (ref 6.4–8.3)

## 2013-04-14 LAB — CBC WITH DIFFERENTIAL/PLATELET
Eosinophils Absolute: 0.2 10*3/uL (ref 0.0–0.5)
LYMPH%: 11.6 % — ABNORMAL LOW (ref 14.0–49.0)
MCV: 87.7 fL (ref 79.3–98.0)
MONO%: 9.4 % (ref 0.0–14.0)
NEUT#: 4.2 10*3/uL (ref 1.5–6.5)
Platelets: 308 10*3/uL (ref 140–400)
RBC: 3.11 10*6/uL — ABNORMAL LOW (ref 4.20–5.82)

## 2013-04-14 NOTE — Progress Notes (Signed)
[facility] [phone]   [phone]  OFFICE PROGRESS NOTE  Runell Gess, MD [address] [place] [address]  DIAGNOSIS: Metastatic non-small cell lung cancer, squamous cell carcinoma with a large anterior mediastinal mass as well as bilateral lymphadenopathy and liver as well as bone metastases diagnosed in June of 2014   PRIOR THERAPY: Palliative radiotherapy to the manubrium under the care of Dr. Kathrynn Running.   CURRENT THERAPY: Systemic chemotherapy with carboplatin for AUC of 5 and paclitaxel 175 mg/M2 every 3 weeks with Neulasta support. First cycle expected on 03/10/2013.   CHEMOTHERAPY INTENT: Palliative  CURRENT # OF CHEMOTHERAPY CYCLES: 1  CURRENT ANTIEMETICS: Zofran, dexamethasone and Compazine  CURRENT SMOKING STATUS: Former smoker quit 03/13/2009  ORAL CHEMOTHERAPY AND CONSENT: None  CURRENT BISPHOSPHONATES USE: None  PAIN MANAGEMENT: 0/10  NARCOTICS INDUCED CONSTIPATION: None  LIVING WILL AND CODE STATUS: Full code.   INTERVAL HISTORY: [name] 72 y.o. male returns to the clinic today for follow up visit accompanied by his interpreter. The patient is feeling much better today. He was recently admitted to [facility] with significant orthostatic hypotension and dehydration secondary to aggressive diuresis because of his congestive heart failure. He received IV hydration during his hospitalization. He was discharged home on 04/02/2013. He has been feeling much better after his discharge and he is here today for evaluation and to resume her systemic therapy. He denied having any significant nausea or vomiting. The patient denied having any fever or chills. He has no chest pain, shortness breath, cough or hemoptysis.  MEDICAL HISTORY: Past Medical History  Diagnosis Date  . Hypertension   . Ischemic cardiomyopathy Sept 2011    EF improved to 35-40% 2D  . COPD (chronic obstructive pulmonary disease)   .  LBBB (left bundle branch block)   . Acute anterior myocardial infarction 2011    s/p PCI to LAD; 2.25 x 12 mm BMS  . ICD (implantable cardiac defibrillator) in place   . Anginal pain   . Sleep apnea     "while sleeping last week" (11/12/2012)  . Stroke 2009    "sometimes left side goes numb/weak/gives out" (11/12/2012)  . Pacemaker   . Shortness of breath     Hx: of with exertion   . Headache(784.0)   . Cancer     Lung cancer    ALLERGIES:  has No Known Allergies.  MEDICATIONS:  Current Outpatient Prescriptions  Medication Sig Dispense Refill  . aspirin EC 81 MG EC tablet Take 1 tablet (81 mg total) by mouth daily.      . carvedilol (COREG) 6.25 MG tablet Take 0.5 tablets (3.125 mg total) by mouth 2 (two) times daily with a meal.  60 tablet  3  . lidocaine-prilocaine (EMLA) cream Apply topically as needed.  30 g  0  . mirtazapine (REMERON) 15 MG tablet Take 1 tablet (15 mg total) by mouth at bedtime.  30 tablet  2  . oxyCODONE-acetaminophen (ROXICET) 5-325 MG per tablet Take 1 tablet by mouth every 4 (four) hours as needed for pain.  30 tablet  0  . ramipril (ALTACE) 2.5 MG capsule Take 1 capsule (2.5 mg total) by mouth daily.  30 capsule  5  . rosuvastatin (CRESTOR) 20 MG tablet Take 20 mg by mouth daily.       No current facility-administered medications for this visit.    SURGICAL HISTORY:  Past Surgical History  Procedure Laterality Date  .  Bi-ventricular implantable cardioverter defibrillator  (crt-d)  11/12/2012  . Coronary angioplasty with stent placement  02/12/2010    LAD BMS  . Insert / replace / remove pacemaker    . Portacath placement Right 02/24/2013    Procedure: INSERTION PORT-A-CATH;  Surgeon: Alleen Borne, MD;  Location: [facility] OR;  Service: Thoracic;  Laterality: Right;    REVIEW OF SYSTEMS:  Constitutional: positive for fatigue Eyes: negative Ears, nose, mouth, throat, and face: negative Respiratory: negative Cardiovascular: negative Gastrointestinal:  negative Genitourinary:negative Integument/breast: negative Hematologic/lymphatic: negative Musculoskeletal:negative Neurological: negative Behavioral/Psych: negative Endocrine: negative Allergic/Immunologic: negative   PHYSICAL EXAMINATION: General appearance: alert, cooperative, fatigued and no distress Head: Normocephalic, without obvious abnormality, atraumatic Neck: no adenopathy, no JVD, supple, symmetrical, trachea midline and thyroid not enlarged, symmetric, no tenderness/mass/nodules Lymph nodes: Cervical, supraclavicular, and axillary nodes normal. Resp: clear to auscultation bilaterally and normal percussion bilaterally Back: symmetric, no curvature. ROM normal. No CVA tenderness. Cardio: regular rate and rhythm, S1, S2 normal, no murmur, click, rub or gallop GI: soft, non-tender; bowel sounds normal; no masses,  no organomegaly Extremities: extremities normal, atraumatic, no cyanosis or edema Neurologic: Alert and oriented X 3, normal strength and tone. Normal symmetric reflexes. Normal coordination and gait  ECOG PERFORMANCE STATUS: 1 - Symptomatic but completely ambulatory  There were no vitals taken for this visit.  LABORATORY DATA: Lab Results  Component Value Date   WBC 5.7 04/14/2013   HGB 8.9* 04/14/2013   HCT 27.3* 04/14/2013   MCV 87.7 04/14/2013   PLT 308 04/14/2013      Chemistry      Component Value Date/Time   NA 141 04/07/2013 1022   NA 136 04/01/2013 0150   K 4.8 04/07/2013 1022   K 4.1 04/01/2013 0150   CL 100 04/01/2013 0150   CO2 26 04/07/2013 1022   CO2 28 04/01/2013 0150   BUN 19.4 04/07/2013 1022   BUN 48* 04/01/2013 0150   CREATININE 1.4* 04/07/2013 1022   CREATININE 2.13* 04/01/2013 0150      Component Value Date/Time   CALCIUM 9.3 04/07/2013 1022   CALCIUM 8.6 04/01/2013 0150   ALKPHOS 90 04/07/2013 1022   ALKPHOS 91 02/24/2013 0716   AST 25 04/07/2013 1022   AST 27 02/24/2013 0716   ALT 22 04/07/2013 1022   ALT 16 02/24/2013 0716   BILITOT 0.25  04/07/2013 1022   BILITOT 0.4 02/24/2013 0716       RADIOGRAPHIC STUDIES: Dg Chest 1 View  03/31/2013   *RADIOLOGY REPORT*  Clinical Data: Hypertension, shortness of breath on exertion, history of lung cancer and CHF  CHEST - 1 VIEW  Comparison: 02/24/2013; PET CT - 01/19/2013; chest CT - 01/11/2013  Findings:  Grossly unchanged cardiac silhouette and mediastinal contours with mild ectasia of the thoracic aorta.  Stable positioning of support apparatus.  The lungs remain hyperexpanded.  No focal airspace opacity.  No pleural effusion or pneumothorax.  No definite evidence of edema.  Unchanged bones.  IMPRESSION: Hyperexpanded lungs without acute cardiopulmonary disease.   Original Report Authenticated By: Tacey Ruiz, MD    ASSESSMENT AND PLAN: this is a very pleasant 72 years old Asian male with metastatic non-small cell lung cancer, squamous cell carcinoma currently undergoing systemic chemotherapy with carboplatin and paclitaxel is status post 1 cycle. The patient has been off treatment for the last few weeks secondary to hypotension and dehydration as well as generalized weakness. He is feeling much better today. I recommended for the patient to resume  her systemic chemotherapy with carboplatin and paclitaxel but I would reduce the dose of carboplatin to AUC of 4 and paclitaxel to 150 mg/M2 every 3 weeks with Neulasta. The patient will start the second cycle tomorrow. He would come back for follow up visit in 3 weeks with the start of cycle #3. He was encouraged to increase his by mouth intake of fluid and also to supplemental nutrition on a regular basis He was advised to call immediately if he has any concerning symptoms in the interval. The patient voices understanding of current disease status and treatment options and is in agreement with the current care plan.  All questions were answered. The patient knows to call the clinic with any problems, questions or concerns. We can certainly see  the patient much sooner if necessary.  I spent 15 minutes counseling the patient face to face. The total time spent in the appointment was 25 minutes.

## 2013-04-14 NOTE — Telephone Encounter (Signed)
gv pt appt schedule for September thru November.  °

## 2013-04-15 ENCOUNTER — Ambulatory Visit: Payer: Medicare Other | Admitting: Nutrition

## 2013-04-15 ENCOUNTER — Ambulatory Visit (HOSPITAL_BASED_OUTPATIENT_CLINIC_OR_DEPARTMENT_OTHER): Payer: Medicare PPO

## 2013-04-15 VITALS — BP 128/83 | HR 79 | Temp 97.6°F | Resp 18

## 2013-04-15 DIAGNOSIS — C7951 Secondary malignant neoplasm of bone: Secondary | ICD-10-CM

## 2013-04-15 DIAGNOSIS — C771 Secondary and unspecified malignant neoplasm of intrathoracic lymph nodes: Secondary | ICD-10-CM

## 2013-04-15 DIAGNOSIS — C787 Secondary malignant neoplasm of liver and intrahepatic bile duct: Secondary | ICD-10-CM

## 2013-04-15 DIAGNOSIS — C3490 Malignant neoplasm of unspecified part of unspecified bronchus or lung: Secondary | ICD-10-CM

## 2013-04-15 DIAGNOSIS — Z5111 Encounter for antineoplastic chemotherapy: Secondary | ICD-10-CM

## 2013-04-15 MED ORDER — FAMOTIDINE IN NACL 20-0.9 MG/50ML-% IV SOLN
20.0000 mg | Freq: Once | INTRAVENOUS | Status: AC
  Administered 2013-04-15: 20 mg via INTRAVENOUS

## 2013-04-15 MED ORDER — FAMOTIDINE IN NACL 20-0.9 MG/50ML-% IV SOLN
INTRAVENOUS | Status: AC
  Filled 2013-04-15: qty 50

## 2013-04-15 MED ORDER — SODIUM CHLORIDE 0.9 % IV SOLN
238.4000 mg | Freq: Once | INTRAVENOUS | Status: AC
  Administered 2013-04-15: 240 mg via INTRAVENOUS
  Filled 2013-04-15: qty 24

## 2013-04-15 MED ORDER — DIPHENHYDRAMINE HCL 50 MG/ML IJ SOLN
50.0000 mg | Freq: Once | INTRAMUSCULAR | Status: AC
  Administered 2013-04-15: 50 mg via INTRAVENOUS

## 2013-04-15 MED ORDER — HEPARIN SOD (PORK) LOCK FLUSH 100 UNIT/ML IV SOLN
500.0000 [IU] | Freq: Once | INTRAVENOUS | Status: AC | PRN
  Administered 2013-04-15: 500 [IU]
  Filled 2013-04-15: qty 5

## 2013-04-15 MED ORDER — DEXAMETHASONE SODIUM PHOSPHATE 20 MG/5ML IJ SOLN
20.0000 mg | Freq: Once | INTRAMUSCULAR | Status: AC
  Administered 2013-04-15: 20 mg via INTRAVENOUS

## 2013-04-15 MED ORDER — DIPHENHYDRAMINE HCL 50 MG/ML IJ SOLN
INTRAMUSCULAR | Status: AC
  Filled 2013-04-15: qty 1

## 2013-04-15 MED ORDER — ONDANSETRON 16 MG/50ML IVPB (CHCC)
INTRAVENOUS | Status: AC
  Filled 2013-04-15: qty 16

## 2013-04-15 MED ORDER — DEXAMETHASONE SODIUM PHOSPHATE 20 MG/5ML IJ SOLN
INTRAMUSCULAR | Status: AC
  Filled 2013-04-15: qty 5

## 2013-04-15 MED ORDER — SODIUM CHLORIDE 0.9 % IJ SOLN
10.0000 mL | INTRAMUSCULAR | Status: DC | PRN
  Administered 2013-04-15: 10 mL
  Filled 2013-04-15: qty 10

## 2013-04-15 MED ORDER — ONDANSETRON 16 MG/50ML IVPB (CHCC)
16.0000 mg | Freq: Once | INTRAVENOUS | Status: AC
  Administered 2013-04-15: 16 mg via INTRAVENOUS

## 2013-04-15 MED ORDER — PACLITAXEL CHEMO INJECTION 300 MG/50ML
150.0000 mg/m2 | Freq: Once | INTRAVENOUS | Status: AC
  Administered 2013-04-15: 234 mg via INTRAVENOUS
  Filled 2013-04-15: qty 39

## 2013-04-15 MED ORDER — SODIUM CHLORIDE 0.9 % IV SOLN
Freq: Once | INTRAVENOUS | Status: AC
  Administered 2013-04-15: 08:00:00 via INTRAVENOUS

## 2013-04-15 NOTE — Patient Instructions (Signed)
[facility] Discharge Instructions for Patients Receiving Chemotherapy  Today you received the following chemotherapy agents TAXOL AND CARBOPLATIN To help prevent nausea and vomiting after your treatment, we encourage you to take your nausea medication COMPAZINE 10 MG EVERY 6 HOURS AS NEEDED FOR NAUSEA.   If you develop nausea and vomiting that is not controlled by your nausea medication, call the clinic.   BELOW ARE SYMPTOMS THAT SHOULD BE REPORTED IMMEDIATELY:  *FEVER GREATER THAN 100.5 F  *CHILLS WITH OR WITHOUT FEVER  NAUSEA AND VOMITING THAT IS NOT CONTROLLED WITH YOUR NAUSEA MEDICATION  *UNUSUAL SHORTNESS OF BREATH  *UNUSUAL BRUISING OR BLEEDING  TENDERNESS IN MOUTH AND THROAT WITH OR WITHOUT PRESENCE OF ULCERS  *URINARY PROBLEMS  *BOWEL PROBLEMS  UNUSUAL RASH Items with * indicate a potential emergency and should be followed up as soon as possible.  Feel free to call the clinic you have any questions or concerns. The clinic phone number is [phone].

## 2013-04-15 NOTE — Progress Notes (Signed)
Patient was seen in chemotherapy room with interpreter.  Patient reports he is eating well.  He has no nutrition impact symptoms.  He denies esophagitis.  Weight documented as 108.7 pounds on September 24 which is stable from 108 pounds on August 20.  Patient requesting samples of Ensure Plus.  Nutrition diagnosis: Unintended weight loss improved.  Intervention: Patient educated to increase intake of soy milk, Asian milk, and Ensure Plus as desired for additional calories and protein.  I have provided patient with one complementary case of Ensure Plus to take today.  Questions were answered.  Teach back method used.  Monitoring, evaluation, goals: Patient is tolerating oral intake for weight stabilization.  Next visit: Thursday, November 6 in the chemotherapy room.

## 2013-04-16 ENCOUNTER — Ambulatory Visit (HOSPITAL_BASED_OUTPATIENT_CLINIC_OR_DEPARTMENT_OTHER): Payer: Medicare PPO

## 2013-04-16 VITALS — BP 144/98 | Temp 97.6°F

## 2013-04-16 DIAGNOSIS — C3490 Malignant neoplasm of unspecified part of unspecified bronchus or lung: Secondary | ICD-10-CM

## 2013-04-16 DIAGNOSIS — C771 Secondary and unspecified malignant neoplasm of intrathoracic lymph nodes: Secondary | ICD-10-CM

## 2013-04-16 MED ORDER — PEGFILGRASTIM INJECTION 6 MG/0.6ML
6.0000 mg | Freq: Once | SUBCUTANEOUS | Status: AC
  Administered 2013-04-16: 6 mg via SUBCUTANEOUS
  Filled 2013-04-16: qty 0.6

## 2013-04-16 NOTE — Patient Instructions (Signed)
We will resume the systemic chemotherapy with carboplatin and paclitaxel tomorrow. followup visit in 3 weeks

## 2013-04-21 ENCOUNTER — Encounter: Payer: Medicare PPO | Admitting: Nutrition

## 2013-04-21 ENCOUNTER — Ambulatory Visit: Payer: Medicare PPO

## 2013-04-21 ENCOUNTER — Other Ambulatory Visit: Payer: Medicare PPO | Admitting: Lab

## 2013-04-22 ENCOUNTER — Other Ambulatory Visit (HOSPITAL_BASED_OUTPATIENT_CLINIC_OR_DEPARTMENT_OTHER): Payer: Medicare PPO | Admitting: Lab

## 2013-04-22 ENCOUNTER — Ambulatory Visit: Payer: Medicare PPO

## 2013-04-22 DIAGNOSIS — C3490 Malignant neoplasm of unspecified part of unspecified bronchus or lung: Secondary | ICD-10-CM

## 2013-04-22 DIAGNOSIS — C787 Secondary malignant neoplasm of liver and intrahepatic bile duct: Secondary | ICD-10-CM

## 2013-04-22 LAB — CBC WITH DIFFERENTIAL/PLATELET
BASO%: 0.5 % (ref 0.0–2.0)
Basophils Absolute: 0.1 10*3/uL (ref 0.0–0.1)
EOS%: 3.7 % (ref 0.0–7.0)
HCT: 26.3 % — ABNORMAL LOW (ref 38.4–49.9)
HGB: 8.6 g/dL — ABNORMAL LOW (ref 13.0–17.1)
MCH: 28.7 pg (ref 27.2–33.4)
MCHC: 32.5 g/dL (ref 32.0–36.0)
MONO%: 11.9 % (ref 0.0–14.0)
NEUT%: 78.2 % — ABNORMAL HIGH (ref 39.0–75.0)
WBC: 11.2 10*3/uL — ABNORMAL HIGH (ref 4.0–10.3)
lymph#: 0.6 10*3/uL — ABNORMAL LOW (ref 0.9–3.3)

## 2013-04-22 LAB — COMPREHENSIVE METABOLIC PANEL (CC13)
ALT: 12 U/L (ref 0–55)
AST: 17 U/L (ref 5–34)
Albumin: 3 g/dL — ABNORMAL LOW (ref 3.5–5.0)
Alkaline Phosphatase: 124 U/L (ref 40–150)
Calcium: 9.5 mg/dL (ref 8.4–10.4)
Chloride: 110 mEq/L — ABNORMAL HIGH (ref 98–109)
Creatinine: 1.2 mg/dL (ref 0.7–1.3)
Glucose: 117 mg/dl (ref 70–140)
Total Bilirubin: 0.39 mg/dL (ref 0.20–1.20)

## 2013-04-28 ENCOUNTER — Other Ambulatory Visit: Payer: Medicare PPO | Admitting: Lab

## 2013-04-29 ENCOUNTER — Other Ambulatory Visit (HOSPITAL_BASED_OUTPATIENT_CLINIC_OR_DEPARTMENT_OTHER): Payer: Medicare PPO

## 2013-04-29 DIAGNOSIS — C3490 Malignant neoplasm of unspecified part of unspecified bronchus or lung: Secondary | ICD-10-CM

## 2013-04-29 LAB — CBC WITH DIFFERENTIAL/PLATELET
BASO%: 0.6 % (ref 0.0–2.0)
Basophils Absolute: 0.1 10*3/uL (ref 0.0–0.1)
EOS%: 2.1 % (ref 0.0–7.0)
Eosinophils Absolute: 0.2 10*3/uL (ref 0.0–0.5)
HCT: 26 % — ABNORMAL LOW (ref 38.4–49.9)
LYMPH%: 6.2 % — ABNORMAL LOW (ref 14.0–49.0)
MCH: 28.8 pg (ref 27.2–33.4)
MCHC: 32.6 g/dL (ref 32.0–36.0)
MCV: 88.5 fL (ref 79.3–98.0)
MONO%: 11 % (ref 0.0–14.0)
NEUT%: 80.1 % — ABNORMAL HIGH (ref 39.0–75.0)
Platelets: 169 10*3/uL (ref 140–400)
RDW: 18.2 % — ABNORMAL HIGH (ref 11.0–14.6)

## 2013-04-29 LAB — COMPREHENSIVE METABOLIC PANEL (CC13)
ALT: 7 U/L (ref 0–55)
AST: 15 U/L (ref 5–34)
Anion Gap: 7 mEq/L (ref 3–11)
BUN: 19.1 mg/dL (ref 7.0–26.0)
Chloride: 109 mEq/L (ref 98–109)
Creatinine: 1.1 mg/dL (ref 0.7–1.3)
Sodium: 141 mEq/L (ref 136–145)
Total Bilirubin: 0.29 mg/dL (ref 0.20–1.20)

## 2013-05-05 ENCOUNTER — Other Ambulatory Visit: Payer: Medicare PPO | Admitting: Lab

## 2013-05-06 ENCOUNTER — Ambulatory Visit (HOSPITAL_BASED_OUTPATIENT_CLINIC_OR_DEPARTMENT_OTHER): Payer: Medicare PPO

## 2013-05-06 ENCOUNTER — Other Ambulatory Visit: Payer: Medicare PPO | Admitting: Lab

## 2013-05-06 ENCOUNTER — Ambulatory Visit (HOSPITAL_BASED_OUTPATIENT_CLINIC_OR_DEPARTMENT_OTHER): Payer: Medicare PPO | Admitting: Physician Assistant

## 2013-05-06 ENCOUNTER — Other Ambulatory Visit (HOSPITAL_BASED_OUTPATIENT_CLINIC_OR_DEPARTMENT_OTHER): Payer: Medicare PPO | Admitting: Lab

## 2013-05-06 ENCOUNTER — Encounter: Payer: Self-pay | Admitting: Internal Medicine

## 2013-05-06 VITALS — BP 127/72 | HR 87 | Temp 97.3°F | Resp 20 | Ht 67.0 in | Wt 112.2 lb

## 2013-05-06 DIAGNOSIS — C771 Secondary and unspecified malignant neoplasm of intrathoracic lymph nodes: Secondary | ICD-10-CM

## 2013-05-06 DIAGNOSIS — C3490 Malignant neoplasm of unspecified part of unspecified bronchus or lung: Secondary | ICD-10-CM

## 2013-05-06 DIAGNOSIS — C787 Secondary malignant neoplasm of liver and intrahepatic bile duct: Secondary | ICD-10-CM

## 2013-05-06 DIAGNOSIS — C7951 Secondary malignant neoplasm of bone: Secondary | ICD-10-CM

## 2013-05-06 DIAGNOSIS — Z5111 Encounter for antineoplastic chemotherapy: Secondary | ICD-10-CM

## 2013-05-06 LAB — COMPREHENSIVE METABOLIC PANEL (CC13)
ALT: 6 U/L (ref 0–55)
AST: 18 U/L (ref 5–34)
Albumin: 3.2 g/dL — ABNORMAL LOW (ref 3.5–5.0)
Calcium: 9.2 mg/dL (ref 8.4–10.4)
Chloride: 108 mEq/L (ref 98–109)
Creatinine: 1.1 mg/dL (ref 0.7–1.3)
Potassium: 4.6 mEq/L (ref 3.5–5.1)
Total Bilirubin: 0.26 mg/dL (ref 0.20–1.20)

## 2013-05-06 LAB — CBC WITH DIFFERENTIAL/PLATELET
BASO%: 0.9 % (ref 0.0–2.0)
Basophils Absolute: 0.1 10*3/uL (ref 0.0–0.1)
EOS%: 2.7 % (ref 0.0–7.0)
Eosinophils Absolute: 0.2 10*3/uL (ref 0.0–0.5)
HCT: 28.5 % — ABNORMAL LOW (ref 38.4–49.9)
HGB: 9.2 g/dL — ABNORMAL LOW (ref 13.0–17.1)
MCH: 28.8 pg (ref 27.2–33.4)
MCV: 89.1 fL (ref 79.3–98.0)
MONO#: 0.9 10*3/uL (ref 0.1–0.9)
NEUT#: 3.6 10*3/uL (ref 1.5–6.5)
NEUT%: 63.6 % (ref 39.0–75.0)
lymph#: 1 10*3/uL (ref 0.9–3.3)

## 2013-05-06 MED ORDER — DEXAMETHASONE SODIUM PHOSPHATE 20 MG/5ML IJ SOLN
20.0000 mg | Freq: Once | INTRAMUSCULAR | Status: AC
  Administered 2013-05-06: 20 mg via INTRAVENOUS

## 2013-05-06 MED ORDER — SODIUM CHLORIDE 0.9 % IJ SOLN
10.0000 mL | INTRAMUSCULAR | Status: DC | PRN
  Administered 2013-05-06: 10 mL
  Filled 2013-05-06: qty 10

## 2013-05-06 MED ORDER — DEXAMETHASONE SODIUM PHOSPHATE 20 MG/5ML IJ SOLN
INTRAMUSCULAR | Status: AC
  Filled 2013-05-06: qty 5

## 2013-05-06 MED ORDER — DIPHENHYDRAMINE HCL 50 MG/ML IJ SOLN
50.0000 mg | Freq: Once | INTRAMUSCULAR | Status: AC
  Administered 2013-05-06: 50 mg via INTRAVENOUS

## 2013-05-06 MED ORDER — DIPHENHYDRAMINE HCL 50 MG/ML IJ SOLN
INTRAMUSCULAR | Status: AC
  Filled 2013-05-06: qty 1

## 2013-05-06 MED ORDER — FAMOTIDINE IN NACL 20-0.9 MG/50ML-% IV SOLN
20.0000 mg | Freq: Once | INTRAVENOUS | Status: AC
  Administered 2013-05-06: 20 mg via INTRAVENOUS

## 2013-05-06 MED ORDER — HEPARIN SOD (PORK) LOCK FLUSH 100 UNIT/ML IV SOLN
500.0000 [IU] | Freq: Once | INTRAVENOUS | Status: AC | PRN
  Administered 2013-05-06: 500 [IU]
  Filled 2013-05-06: qty 5

## 2013-05-06 MED ORDER — SODIUM CHLORIDE 0.9 % IV SOLN
Freq: Once | INTRAVENOUS | Status: AC
  Administered 2013-05-06: 11:00:00 via INTRAVENOUS

## 2013-05-06 MED ORDER — SODIUM CHLORIDE 0.9 % IV SOLN
276.0000 mg | Freq: Once | INTRAVENOUS | Status: AC
  Administered 2013-05-06: 280 mg via INTRAVENOUS
  Filled 2013-05-06: qty 28

## 2013-05-06 MED ORDER — ONDANSETRON 16 MG/50ML IVPB (CHCC)
16.0000 mg | Freq: Once | INTRAVENOUS | Status: AC
  Administered 2013-05-06: 16 mg via INTRAVENOUS

## 2013-05-06 MED ORDER — PACLITAXEL CHEMO INJECTION 300 MG/50ML
150.0000 mg/m2 | Freq: Once | INTRAVENOUS | Status: AC
  Administered 2013-05-06: 234 mg via INTRAVENOUS
  Filled 2013-05-06: qty 39

## 2013-05-06 MED ORDER — ONDANSETRON 16 MG/50ML IVPB (CHCC)
INTRAVENOUS | Status: AC
  Filled 2013-05-06: qty 16

## 2013-05-06 MED ORDER — FAMOTIDINE IN NACL 20-0.9 MG/50ML-% IV SOLN
INTRAVENOUS | Status: AC
  Filled 2013-05-06: qty 50

## 2013-05-06 NOTE — Progress Notes (Addendum)
[facility] [phone]   [phone]  SHARED VISIT PROGRESS NOTE  Runell Gess, MD [address] [place] [address]  DIAGNOSIS: Metastatic non-small cell lung cancer, squamous cell carcinoma with a large anterior mediastinal mass as well as bilateral lymphadenopathy and liver as well as bone metastases diagnosed in June of 2014   PRIOR THERAPY: Palliative radiotherapy to the manubrium under the care of Dr. Kathrynn Running.   CURRENT THERAPY: Systemic chemotherapy with carboplatin for AUC of 5 and paclitaxel 175 mg/M2 every 3 weeks with Neulasta support. Status post 2 cycles   CHEMOTHERAPY INTENT: Palliative  CURRENT # OF CHEMOTHERAPY CYCLES: 3 CURRENT ANTIEMETICS: Zofran, dexamethasone and Compazine  CURRENT SMOKING STATUS: Former smoker quit 03/13/2009  ORAL CHEMOTHERAPY AND CONSENT: None  CURRENT BISPHOSPHONATES USE: None  PAIN MANAGEMENT: 0/10  NARCOTICS INDUCED CONSTIPATION: None  LIVING WILL AND CODE STATUS: Full code.   INTERVAL HISTORY: [name] 72 y.o. male returns to the clinic today for follow up visit accompanied by his interpreter. The patient is feeling fine today. He voiced no specific complaints today. Overall is tolerating his chemotherapy relatively well. He's had no further admissions to the hospital for hypotension or dehydration. He presents to proceed with cycle #3 of his systemic chemotherapy with carboplatin and paclitaxel with Neulasta support. He denied having any significant nausea or vomiting. The patient denied having any fever or chills. He has no chest pain, shortness breath, cough or hemoptysis.  MEDICAL HISTORY: Past Medical History  Diagnosis Date  . Hypertension   . Ischemic cardiomyopathy Sept 2011    EF improved to 35-40% 2D  . COPD (chronic obstructive pulmonary disease)   . LBBB (left bundle branch block)   . Acute anterior myocardial infarction 2011    s/p PCI to LAD; 2.25 x 12 mm BMS   . ICD (implantable cardiac defibrillator) in place   . Anginal pain   . Sleep apnea     "while sleeping last week" (11/12/2012)  . Stroke 2009    "sometimes left side goes numb/weak/gives out" (11/12/2012)  . Pacemaker   . Shortness of breath     Hx: of with exertion   . Headache(784.0)   . Cancer     Lung cancer    ALLERGIES:  has No Known Allergies.  MEDICATIONS:  Current Outpatient Prescriptions  Medication Sig Dispense Refill  . aspirin EC 81 MG EC tablet Take 1 tablet (81 mg total) by mouth daily.      . carvedilol (COREG) 6.25 MG tablet Take 0.5 tablets (3.125 mg total) by mouth 2 (two) times daily with a meal.  60 tablet  3  . furosemide (LASIX) 40 MG tablet       . isosorbide dinitrate (ISORDIL) 10 MG tablet       . lidocaine-prilocaine (EMLA) cream Apply topically as needed.  30 g  0  . mirtazapine (REMERON) 15 MG tablet Take 1 tablet (15 mg total) by mouth at bedtime.  30 tablet  2  . oxyCODONE-acetaminophen (ROXICET) 5-325 MG per tablet Take 1 tablet by mouth every 4 (four) hours as needed for pain.  30 tablet  0  . prochlorperazine (COMPAZINE) 10 MG tablet       . ramipril (ALTACE) 2.5 MG capsule Take 1 capsule (2.5 mg total) by mouth daily.  30 capsule  5  . rosuvastatin (CRESTOR) 20 MG tablet Take 20 mg by mouth daily.       No current  facility-administered medications for this visit.   Facility-Administered Medications Ordered in Other Visits  Medication Dose Route Frequency Provider Last Rate Last Dose  . sodium chloride 0.9 % injection 10 mL  10 mL Intracatheter PRN Conni Slipper, PA-C   10 mL at 05/06/13 1518    SURGICAL HISTORY:  Past Surgical History  Procedure Laterality Date  . Bi-ventricular implantable cardioverter defibrillator  (crt-d)  11/12/2012  . Coronary angioplasty with stent placement  02/12/2010    LAD BMS  . Insert / replace / remove pacemaker    . Portacath placement Right 02/24/2013    Procedure: INSERTION PORT-A-CATH;  Surgeon: Alleen Borne, MD;  Location: [facility] OR;  Service: Thoracic;  Laterality: Right;    REVIEW OF SYSTEMS:  Constitutional: negative Eyes: negative Ears, nose, mouth, throat, and face: negative Respiratory: negative Cardiovascular: negative Gastrointestinal: negative Genitourinary:negative Integument/breast: negative Hematologic/lymphatic: negative Musculoskeletal:negative Neurological: negative Behavioral/Psych: negative Endocrine: negative Allergic/Immunologic: negative   PHYSICAL EXAMINATION: General appearance: alert, cooperative, fatigued and no distress Head: Normocephalic, without obvious abnormality, atraumatic Neck: no adenopathy, no JVD, supple, symmetrical, trachea midline and thyroid not enlarged, symmetric, no tenderness/mass/nodules Lymph nodes: Cervical, supraclavicular, and axillary nodes normal. Resp: clear to auscultation bilaterally and normal percussion bilaterally Back: symmetric, no curvature. ROM normal. No CVA tenderness. Cardio: regular rate and rhythm, S1, S2 normal, no murmur, click, rub or gallop GI: soft, non-tender; bowel sounds normal; no masses,  no organomegaly Extremities: extremities normal, atraumatic, no cyanosis or edema Neurologic: Alert and oriented X 3, normal strength and tone. Normal symmetric reflexes. Normal coordination and gait  ECOG PERFORMANCE STATUS: 1 - Symptomatic but completely ambulatory  Blood pressure 127/72, pulse 87, temperature 97.3 F (36.3 C), temperature source Oral, resp. rate 20, height 5\' 7"  (1.702 m), weight 112 lb 3.2 oz (50.894 kg).  LABORATORY DATA: Lab Results  Component Value Date   WBC 5.7 05/06/2013   HGB 9.2* 05/06/2013   HCT 28.5* 05/06/2013   MCV 89.1 05/06/2013   PLT 269 05/06/2013      Chemistry      Component Value Date/Time   NA 140 05/06/2013 0912   NA 136 04/01/2013 0150   K 4.6 05/06/2013 0912   K 4.1 04/01/2013 0150   CL 100 04/01/2013 0150   CO2 24 05/06/2013 0912   CO2 28 04/01/2013 0150   BUN  19.3 05/06/2013 0912   BUN 48* 04/01/2013 0150   CREATININE 1.1 05/06/2013 0912   CREATININE 2.13* 04/01/2013 0150      Component Value Date/Time   CALCIUM 9.2 05/06/2013 0912   CALCIUM 8.6 04/01/2013 0150   ALKPHOS 106 05/06/2013 0912   ALKPHOS 91 02/24/2013 0716   AST 18 05/06/2013 0912   AST 27 02/24/2013 0716   ALT 6 05/06/2013 0912   ALT 16 02/24/2013 0716   BILITOT 0.26 05/06/2013 0912   BILITOT 0.4 02/24/2013 0716       RADIOGRAPHIC STUDIES: Dg Chest 1 View  03/31/2013   *RADIOLOGY REPORT*  Clinical Data: Hypertension, shortness of breath on exertion, history of lung cancer and CHF  CHEST - 1 VIEW  Comparison: 02/24/2013; PET CT - 01/19/2013; chest CT - 01/11/2013  Findings:  Grossly unchanged cardiac silhouette and mediastinal contours with mild ectasia of the thoracic aorta.  Stable positioning of support apparatus.  The lungs remain hyperexpanded.  No focal airspace opacity.  No pleural effusion or pneumothorax.  No definite evidence of edema.  Unchanged bones.  IMPRESSION: Hyperexpanded lungs without acute cardiopulmonary disease.  Original Report Authenticated By: Tacey Ruiz, MD    ASSESSMENT AND PLAN: this is a very pleasant 72 years old Asian male with metastatic non-small cell lung cancer, squamous cell carcinoma currently undergoing systemic chemotherapy with carboplatin and paclitaxel with Neulasta support and is status post 2 cycles. Patient was discussed with and also seen by Dr. Arbutus Ped. He will proceed with cycle #3 of his systemic chemotherapy with carboplatin for an AUC of 4 and paclitaxel at 175 mg per meter square given every 3 weeks with Neulasta support as scheduled today. He'll continue with weekly labs consisting of a CBC differential and C. met. He'll followup with Dr. Arbutus Ped in 3 weeks with a restaging CT scan of the chest, abdomen and pelvis with contrast to reevaluate his disease. When he returns in 3 weeks he will he due for cycle #4.   Laural Benes, [name] Canipe E,  PA-C   He was advised to call immediately if he has any concerning symptoms in the interval. The patient voices understanding of current disease status and treatment options and is in agreement with the current care plan.  All questions were answered. The patient knows to call the clinic with any problems, questions or concerns. We can certainly see the patient much sooner if necessary.  ADDENDUM: Hematology/Oncology Attending: I had a face to face encounter with the patient. I recommended his care plan. The patient tolerated the second cycle of his systemic chemotherapy with carboplatin and paclitaxel fairly well. He denied having any significant chest pain, shortness of breath, cough or hemoptysis. The patient has no nausea or vomiting. He has no fever or chills. We will proceed with cycle #3 today as scheduled. The patient would come back for follow up visit in 3 weeks with repeat CT scan of the chest, abdomen and pelvis for restaging of his disease. Lajuana Matte., MD 05/08/2013

## 2013-05-06 NOTE — Patient Instructions (Signed)
[facility] Discharge Instructions for Patients Receiving Chemotherapy  Today you received the following chemotherapy agents Taxol/Carboplatin To help prevent nausea and vomiting after your treatment, we encourage you to take your nausea medication as prescribed.  If you develop nausea and vomiting that is not controlled by your nausea medication, call the clinic.   BELOW ARE SYMPTOMS THAT SHOULD BE REPORTED IMMEDIATELY:  *FEVER GREATER THAN 100.5 F  *CHILLS WITH OR WITHOUT FEVER  NAUSEA AND VOMITING THAT IS NOT CONTROLLED WITH YOUR NAUSEA MEDICATION  *UNUSUAL SHORTNESS OF BREATH  *UNUSUAL BRUISING OR BLEEDING  TENDERNESS IN MOUTH AND THROAT WITH OR WITHOUT PRESENCE OF ULCERS  *URINARY PROBLEMS  *BOWEL PROBLEMS  UNUSUAL RASH Items with * indicate a potential emergency and should be followed up as soon as possible.  Feel free to call the clinic you have any questions or concerns. The clinic phone number is [phone].    

## 2013-05-07 ENCOUNTER — Telehealth: Payer: Self-pay | Admitting: Internal Medicine

## 2013-05-07 ENCOUNTER — Ambulatory Visit (HOSPITAL_BASED_OUTPATIENT_CLINIC_OR_DEPARTMENT_OTHER): Payer: Medicare PPO

## 2013-05-07 VITALS — BP 172/100 | HR 80 | Temp 97.2°F

## 2013-05-07 DIAGNOSIS — Z5189 Encounter for other specified aftercare: Secondary | ICD-10-CM

## 2013-05-07 DIAGNOSIS — C771 Secondary and unspecified malignant neoplasm of intrathoracic lymph nodes: Secondary | ICD-10-CM

## 2013-05-07 DIAGNOSIS — C7951 Secondary malignant neoplasm of bone: Secondary | ICD-10-CM

## 2013-05-07 DIAGNOSIS — C3490 Malignant neoplasm of unspecified part of unspecified bronchus or lung: Secondary | ICD-10-CM

## 2013-05-07 MED ORDER — PEGFILGRASTIM INJECTION 6 MG/0.6ML
6.0000 mg | Freq: Once | SUBCUTANEOUS | Status: AC
  Administered 2013-05-07: 6 mg via SUBCUTANEOUS
  Filled 2013-05-07: qty 0.6

## 2013-05-07 NOTE — Patient Instructions (Signed)
Continue weekly labs Follow up with Dr. Arbutus Ped in 3 weeks with a restaging CT scan of your chest, abdomen and pelvis to re-evaluate your disease

## 2013-05-07 NOTE — Telephone Encounter (Signed)
lvm on daughter phone....call Jacqualine Mau @ [phone] and she stated that she is aware of all of the pts appts via epic and [facility] and the pt will be notified.

## 2013-05-12 ENCOUNTER — Other Ambulatory Visit: Payer: Medicare PPO | Admitting: Lab

## 2013-05-13 ENCOUNTER — Other Ambulatory Visit (HOSPITAL_BASED_OUTPATIENT_CLINIC_OR_DEPARTMENT_OTHER): Payer: Medicare PPO

## 2013-05-13 ENCOUNTER — Ambulatory Visit: Payer: Medicare PPO

## 2013-05-13 DIAGNOSIS — C3490 Malignant neoplasm of unspecified part of unspecified bronchus or lung: Secondary | ICD-10-CM

## 2013-05-13 LAB — COMPREHENSIVE METABOLIC PANEL (CC13)
Alkaline Phosphatase: 218 U/L — ABNORMAL HIGH (ref 40–150)
BUN: 21.4 mg/dL (ref 7.0–26.0)
Chloride: 109 mEq/L (ref 98–109)
Creatinine: 1.2 mg/dL (ref 0.7–1.3)
Glucose: 87 mg/dl (ref 70–140)
Sodium: 142 mEq/L (ref 136–145)
Total Bilirubin: 0.52 mg/dL (ref 0.20–1.20)
Total Protein: 8.5 g/dL — ABNORMAL HIGH (ref 6.4–8.3)

## 2013-05-13 LAB — CBC WITH DIFFERENTIAL/PLATELET
Basophils Absolute: 0.1 10*3/uL (ref 0.0–0.1)
Eosinophils Absolute: 0.2 10*3/uL (ref 0.0–0.5)
HCT: 29.4 % — ABNORMAL LOW (ref 38.4–49.9)
HGB: 9.4 g/dL — ABNORMAL LOW (ref 13.0–17.1)
MONO#: 1.1 10*3/uL — ABNORMAL HIGH (ref 0.1–0.9)
MONO%: 6.9 % (ref 0.0–14.0)
NEUT#: 14.3 10*3/uL — ABNORMAL HIGH (ref 1.5–6.5)
NEUT%: 86.7 % — ABNORMAL HIGH (ref 39.0–75.0)
RDW: 18 % — ABNORMAL HIGH (ref 11.0–14.6)
WBC: 16.5 10*3/uL — ABNORMAL HIGH (ref 4.0–10.3)
lymph#: 0.8 10*3/uL — ABNORMAL LOW (ref 0.9–3.3)

## 2013-05-20 ENCOUNTER — Other Ambulatory Visit (HOSPITAL_BASED_OUTPATIENT_CLINIC_OR_DEPARTMENT_OTHER): Payer: Medicare PPO | Admitting: Lab

## 2013-05-20 DIAGNOSIS — C3490 Malignant neoplasm of unspecified part of unspecified bronchus or lung: Secondary | ICD-10-CM

## 2013-05-20 LAB — CBC WITH DIFFERENTIAL/PLATELET
Basophils Absolute: 0.1 10*3/uL (ref 0.0–0.1)
Eosinophils Absolute: 0.3 10*3/uL (ref 0.0–0.5)
HCT: 25.9 % — ABNORMAL LOW (ref 38.4–49.9)
HGB: 8.5 g/dL — ABNORMAL LOW (ref 13.0–17.1)
LYMPH%: 7.9 % — ABNORMAL LOW (ref 14.0–49.0)
MCH: 29.5 pg (ref 27.2–33.4)
MONO#: 1.2 10*3/uL — ABNORMAL HIGH (ref 0.1–0.9)
NEUT#: 8 10*3/uL — ABNORMAL HIGH (ref 1.5–6.5)
NEUT%: 77.3 % — ABNORMAL HIGH (ref 39.0–75.0)
Platelets: 178 10*3/uL (ref 140–400)
RBC: 2.9 10*6/uL — ABNORMAL LOW (ref 4.20–5.82)
WBC: 10.4 10*3/uL — ABNORMAL HIGH (ref 4.0–10.3)

## 2013-05-20 LAB — COMPREHENSIVE METABOLIC PANEL (CC13)
Alkaline Phosphatase: 135 U/L (ref 40–150)
Anion Gap: 8 mEq/L (ref 3–11)
BUN: 15.1 mg/dL (ref 7.0–26.0)
CO2: 25 mEq/L (ref 22–29)
Calcium: 8.8 mg/dL (ref 8.4–10.4)
Chloride: 112 mEq/L — ABNORMAL HIGH (ref 98–109)
Creatinine: 1.1 mg/dL (ref 0.7–1.3)
Glucose: 101 mg/dl (ref 70–140)
Sodium: 145 mEq/L (ref 136–145)
Total Bilirubin: 0.33 mg/dL (ref 0.20–1.20)
Total Protein: 6.9 g/dL (ref 6.4–8.3)

## 2013-05-25 ENCOUNTER — Ambulatory Visit (HOSPITAL_COMMUNITY)
Admission: RE | Admit: 2013-05-25 | Discharge: 2013-05-25 | Disposition: A | Discharge status: Home / Self Care | Payer: Medicare PPO | Source: Ambulatory Visit | Attending: Physician Assistant | Admitting: Physician Assistant

## 2013-05-25 ENCOUNTER — Encounter (HOSPITAL_COMMUNITY): Payer: Self-pay

## 2013-05-25 DIAGNOSIS — N281 Cyst of kidney, acquired: Secondary | ICD-10-CM | POA: Insufficient documentation

## 2013-05-25 DIAGNOSIS — C771 Secondary and unspecified malignant neoplasm of intrathoracic lymph nodes: Secondary | ICD-10-CM

## 2013-05-25 DIAGNOSIS — C7951 Secondary malignant neoplasm of bone: Secondary | ICD-10-CM | POA: Insufficient documentation

## 2013-05-25 DIAGNOSIS — I714 Abdominal aortic aneurysm, without rupture, unspecified: Secondary | ICD-10-CM | POA: Insufficient documentation

## 2013-05-25 DIAGNOSIS — C787 Secondary malignant neoplasm of liver and intrahepatic bile duct: Secondary | ICD-10-CM | POA: Insufficient documentation

## 2013-05-25 DIAGNOSIS — R599 Enlarged lymph nodes, unspecified: Secondary | ICD-10-CM | POA: Insufficient documentation

## 2013-05-25 DIAGNOSIS — I2510 Atherosclerotic heart disease of native coronary artery without angina pectoris: Secondary | ICD-10-CM | POA: Insufficient documentation

## 2013-05-25 DIAGNOSIS — I700 Atherosclerosis of aorta: Secondary | ICD-10-CM | POA: Insufficient documentation

## 2013-05-25 DIAGNOSIS — C3490 Malignant neoplasm of unspecified part of unspecified bronchus or lung: Secondary | ICD-10-CM | POA: Insufficient documentation

## 2013-05-25 DIAGNOSIS — M948X9 Other specified disorders of cartilage, unspecified sites: Secondary | ICD-10-CM | POA: Insufficient documentation

## 2013-05-25 MED ORDER — IOHEXOL 300 MG/ML  SOLN
100.0000 mL | Freq: Once | INTRAMUSCULAR | Status: AC | PRN
  Administered 2013-05-25: 80 mL via INTRAVENOUS

## 2013-05-25 MED ORDER — IOHEXOL 300 MG/ML  SOLN
50.0000 mL | Freq: Once | INTRAMUSCULAR | Status: AC | PRN
  Administered 2013-05-25: 50 mL via ORAL

## 2013-05-27 ENCOUNTER — Ambulatory Visit (HOSPITAL_BASED_OUTPATIENT_CLINIC_OR_DEPARTMENT_OTHER): Payer: Medicare PPO | Admitting: Physician Assistant

## 2013-05-27 ENCOUNTER — Encounter (INDEPENDENT_AMBULATORY_CARE_PROVIDER_SITE_OTHER): Payer: Self-pay

## 2013-05-27 ENCOUNTER — Other Ambulatory Visit (HOSPITAL_BASED_OUTPATIENT_CLINIC_OR_DEPARTMENT_OTHER): Payer: Medicare PPO | Admitting: Lab

## 2013-05-27 ENCOUNTER — Telehealth: Payer: Self-pay | Admitting: Internal Medicine

## 2013-05-27 ENCOUNTER — Ambulatory Visit (HOSPITAL_BASED_OUTPATIENT_CLINIC_OR_DEPARTMENT_OTHER): Payer: Medicare PPO

## 2013-05-27 ENCOUNTER — Ambulatory Visit: Payer: Medicare Other | Admitting: Nutrition

## 2013-05-27 ENCOUNTER — Telehealth: Payer: Self-pay | Admitting: *Deleted

## 2013-05-27 ENCOUNTER — Other Ambulatory Visit: Payer: Medicare PPO | Admitting: Lab

## 2013-05-27 ENCOUNTER — Encounter: Payer: Self-pay | Admitting: Physician Assistant

## 2013-05-27 VITALS — BP 136/73 | HR 79 | Temp 97.1°F | Resp 18 | Ht 67.0 in | Wt 115.8 lb

## 2013-05-27 DIAGNOSIS — C771 Secondary and unspecified malignant neoplasm of intrathoracic lymph nodes: Secondary | ICD-10-CM

## 2013-05-27 DIAGNOSIS — C7951 Secondary malignant neoplasm of bone: Secondary | ICD-10-CM

## 2013-05-27 DIAGNOSIS — C3490 Malignant neoplasm of unspecified part of unspecified bronchus or lung: Secondary | ICD-10-CM

## 2013-05-27 DIAGNOSIS — Z5111 Encounter for antineoplastic chemotherapy: Secondary | ICD-10-CM

## 2013-05-27 DIAGNOSIS — C787 Secondary malignant neoplasm of liver and intrahepatic bile duct: Secondary | ICD-10-CM

## 2013-05-27 LAB — COMPREHENSIVE METABOLIC PANEL (CC13)
ALT: 8 U/L (ref 0–55)
Anion Gap: 8 mEq/L (ref 3–11)
CO2: 23 mEq/L (ref 22–29)
Calcium: 8.8 mg/dL (ref 8.4–10.4)
Chloride: 111 mEq/L — ABNORMAL HIGH (ref 98–109)
Glucose: 71 mg/dl (ref 70–140)
Sodium: 141 mEq/L (ref 136–145)
Total Bilirubin: 0.28 mg/dL (ref 0.20–1.20)
Total Protein: 7 g/dL (ref 6.4–8.3)

## 2013-05-27 LAB — CBC WITH DIFFERENTIAL/PLATELET
BASO%: 0.7 % (ref 0.0–2.0)
EOS%: 3.1 % (ref 0.0–7.0)
Eosinophils Absolute: 0.2 10*3/uL (ref 0.0–0.5)
HCT: 25.5 % — ABNORMAL LOW (ref 38.4–49.9)
HGB: 8.2 g/dL — ABNORMAL LOW (ref 13.0–17.1)
MCH: 29 pg (ref 27.2–33.4)
MCHC: 32.2 g/dL (ref 32.0–36.0)
MONO#: 1 10*3/uL — ABNORMAL HIGH (ref 0.1–0.9)
NEUT#: 4.2 10*3/uL (ref 1.5–6.5)
NEUT%: 67.5 % (ref 39.0–75.0)
Platelets: 203 10*3/uL (ref 140–400)
RDW: 17.6 % — ABNORMAL HIGH (ref 11.0–14.6)
WBC: 6.1 10*3/uL (ref 4.0–10.3)
lymph#: 0.7 10*3/uL — ABNORMAL LOW (ref 0.9–3.3)

## 2013-05-27 MED ORDER — SODIUM CHLORIDE 0.9 % IV SOLN
280.0000 mg | Freq: Once | INTRAVENOUS | Status: AC
  Administered 2013-05-27: 280 mg via INTRAVENOUS
  Filled 2013-05-27: qty 28

## 2013-05-27 MED ORDER — FAMOTIDINE IN NACL 20-0.9 MG/50ML-% IV SOLN
INTRAVENOUS | Status: AC
  Filled 2013-05-27: qty 50

## 2013-05-27 MED ORDER — DIPHENHYDRAMINE HCL 50 MG/ML IJ SOLN
50.0000 mg | Freq: Once | INTRAMUSCULAR | Status: AC
  Administered 2013-05-27: 50 mg via INTRAVENOUS

## 2013-05-27 MED ORDER — DEXAMETHASONE SODIUM PHOSPHATE 20 MG/5ML IJ SOLN
INTRAMUSCULAR | Status: AC
  Filled 2013-05-27: qty 5

## 2013-05-27 MED ORDER — DEXAMETHASONE SODIUM PHOSPHATE 20 MG/5ML IJ SOLN
20.0000 mg | Freq: Once | INTRAMUSCULAR | Status: AC
  Administered 2013-05-27: 20 mg via INTRAVENOUS

## 2013-05-27 MED ORDER — ONDANSETRON 16 MG/50ML IVPB (CHCC)
16.0000 mg | Freq: Once | INTRAVENOUS | Status: AC
  Administered 2013-05-27: 16 mg via INTRAVENOUS

## 2013-05-27 MED ORDER — PACLITAXEL CHEMO INJECTION 300 MG/50ML
150.0000 mg/m2 | Freq: Once | INTRAVENOUS | Status: AC
  Administered 2013-05-27: 234 mg via INTRAVENOUS
  Filled 2013-05-27: qty 39

## 2013-05-27 MED ORDER — ONDANSETRON 16 MG/50ML IVPB (CHCC)
INTRAVENOUS | Status: AC
  Filled 2013-05-27: qty 16

## 2013-05-27 MED ORDER — HEPARIN SOD (PORK) LOCK FLUSH 100 UNIT/ML IV SOLN
500.0000 [IU] | Freq: Once | INTRAVENOUS | Status: AC | PRN
  Administered 2013-05-27: 500 [IU]
  Filled 2013-05-27: qty 5

## 2013-05-27 MED ORDER — DIPHENHYDRAMINE HCL 50 MG/ML IJ SOLN
INTRAMUSCULAR | Status: AC
  Filled 2013-05-27: qty 1

## 2013-05-27 MED ORDER — FAMOTIDINE IN NACL 20-0.9 MG/50ML-% IV SOLN
20.0000 mg | Freq: Once | INTRAVENOUS | Status: AC
  Administered 2013-05-27: 20 mg via INTRAVENOUS

## 2013-05-27 MED ORDER — SODIUM CHLORIDE 0.9 % IJ SOLN
10.0000 mL | INTRAMUSCULAR | Status: DC | PRN
  Administered 2013-05-27: 10 mL
  Filled 2013-05-27: qty 10

## 2013-05-27 MED ORDER — SODIUM CHLORIDE 0.9 % IV SOLN
Freq: Once | INTRAVENOUS | Status: AC
  Administered 2013-05-27: 12:00:00 via INTRAVENOUS

## 2013-05-27 NOTE — Patient Instructions (Signed)
[facility] Discharge Instructions for Patients Receiving Chemotherapy  Today you received the following chemotherapy agents: Taxol and Carboplatin.  To help prevent nausea and vomiting after your treatment, we encourage you to take your nausea medication as prescribed.   If you develop nausea and vomiting that is not controlled by your nausea medication, call the clinic.   BELOW ARE SYMPTOMS THAT SHOULD BE REPORTED IMMEDIATELY:  *FEVER GREATER THAN 100.5 F  *CHILLS WITH OR WITHOUT FEVER  NAUSEA AND VOMITING THAT IS NOT CONTROLLED WITH YOUR NAUSEA MEDICATION  *UNUSUAL SHORTNESS OF BREATH  *UNUSUAL BRUISING OR BLEEDING  TENDERNESS IN MOUTH AND THROAT WITH OR WITHOUT PRESENCE OF ULCERS  *URINARY PROBLEMS  *BOWEL PROBLEMS  UNUSUAL RASH Items with * indicate a potential emergency and should be followed up as soon as possible.  Feel free to call the clinic you have any questions or concerns. The clinic phone number is [phone].    

## 2013-05-27 NOTE — Progress Notes (Signed)
Per Dr. Arbutus Ped, OK to treat with hgb 8.2 today.

## 2013-05-27 NOTE — Progress Notes (Addendum)
[facility] [phone]   [phone]  SHARED VISIT PROGRESS NOTE  Runell Gess, MD [address] [place] [address]  DIAGNOSIS: Metastatic non-small cell lung cancer, squamous cell carcinoma with a large anterior mediastinal mass as well as bilateral lymphadenopathy and liver as well as bone metastases diagnosed in June of 2014   PRIOR THERAPY: Palliative radiotherapy to the manubrium under the care of Dr. Kathrynn Running.   CURRENT THERAPY: Systemic chemotherapy with carboplatin for AUC of 5 and paclitaxel 175 mg/M2 every 3 weeks with Neulasta support. Status post 3 cycles   CHEMOTHERAPY INTENT: Palliative  CURRENT # OF CHEMOTHERAPY CYCLES: 4 CURRENT ANTIEMETICS: Zofran, dexamethasone and Compazine  CURRENT SMOKING STATUS: Former smoker quit 03/13/2009  ORAL CHEMOTHERAPY AND CONSENT: None  CURRENT BISPHOSPHONATES USE: None  PAIN MANAGEMENT: 0/10  NARCOTICS INDUCED CONSTIPATION: None  LIVING WILL AND CODE STATUS: Full code.   INTERVAL HISTORY: Quamere Mussell 72 y.o. male returns to the clinic today for follow up visit accompanied by his interpreter. The patient is feeling fine today. He voiced no specific complaints today. Overall is tolerating his chemotherapy relatively well. He presents to proceed with cycle #4 of his systemic chemotherapy with carboplatin and paclitaxel with Neulasta support. He denied having any significant nausea or vomiting. The patient denied having any fever or chills. He has no chest pain, shortness breath, cough or hemoptysis. He recently had a restaging CT of the chest, abdomen pelvis and also presents to discuss the results of those studies.  MEDICAL HISTORY: Past Medical History  Diagnosis Date  . Hypertension   . Ischemic cardiomyopathy Sept 2011    EF improved to 35-40% 2D  . COPD (chronic obstructive pulmonary disease)   . LBBB (left bundle branch block)   . Acute anterior myocardial infarction 2011     s/p PCI to LAD; 2.25 x 12 mm BMS  . ICD (implantable cardiac defibrillator) in place   . Anginal pain   . Sleep apnea     "while sleeping last week" (11/12/2012)  . Stroke 2009    "sometimes left side goes numb/weak/gives out" (11/12/2012)  . Pacemaker   . Shortness of breath     Hx: of with exertion   . Headache(784.0)   . Cancer     Lung cancer    ALLERGIES:  has No Known Allergies.  MEDICATIONS:  Current Outpatient Prescriptions  Medication Sig Dispense Refill  . carvedilol (COREG) 6.25 MG tablet Take 0.5 tablets (3.125 mg total) by mouth 2 (two) times daily with a meal.  60 tablet  3  . isosorbide dinitrate (ISORDIL) 10 MG tablet       . ramipril (ALTACE) 2.5 MG capsule Take 1 capsule (2.5 mg total) by mouth daily.  30 capsule  5  . rosuvastatin (CRESTOR) 20 MG tablet Take 20 mg by mouth daily.      Marland Kitchen aspirin EC 81 MG EC tablet Take 1 tablet (81 mg total) by mouth daily.      . furosemide (LASIX) 40 MG tablet       . lidocaine-prilocaine (EMLA) cream Apply topically as needed.  30 g  0  . mirtazapine (REMERON) 15 MG tablet Take 1 tablet (15 mg total) by mouth at bedtime.  30 tablet  2  . oxyCODONE-acetaminophen (ROXICET) 5-325 MG per tablet Take 1 tablet by mouth every 4 (four) hours as needed for pain.  30 tablet  0  . prochlorperazine (COMPAZINE) 10 MG tablet  No current facility-administered medications for this visit.   Facility-Administered Medications Ordered in Other Visits  Medication Dose Route Frequency Provider Last Rate Last Dose  . sodium chloride 0.9 % injection 10 mL  10 mL Intracatheter PRN Conni Slipper, PA-C   10 mL at 05/27/13 1621    SURGICAL HISTORY:  Past Surgical History  Procedure Laterality Date  . Bi-ventricular implantable cardioverter defibrillator  (crt-d)  11/12/2012  . Coronary angioplasty with stent placement  02/12/2010    LAD BMS  . Insert / replace / remove pacemaker    . Portacath placement Right 02/24/2013    Procedure:  INSERTION PORT-A-CATH;  Surgeon: Alleen Borne, MD;  Location: [facility] OR;  Service: Thoracic;  Laterality: Right;    REVIEW OF SYSTEMS:  Constitutional: negative Eyes: negative Ears, nose, mouth, throat, and face: negative Respiratory: negative Cardiovascular: negative Gastrointestinal: negative Genitourinary:negative Integument/breast: negative Hematologic/lymphatic: negative Musculoskeletal:negative Neurological: negative Behavioral/Psych: negative Endocrine: negative Allergic/Immunologic: negative   PHYSICAL EXAMINATION: General appearance: alert, cooperative, fatigued and no distress Head: Normocephalic, without obvious abnormality, atraumatic Neck: no adenopathy, no JVD, supple, symmetrical, trachea midline and thyroid not enlarged, symmetric, no tenderness/mass/nodules Lymph nodes: Cervical, supraclavicular, and axillary nodes normal. Resp: clear to auscultation bilaterally and normal percussion bilaterally Back: symmetric, no curvature. ROM normal. No CVA tenderness. Cardio: regular rate and rhythm, S1, S2 normal, no murmur, click, rub or gallop GI: soft, non-tender; bowel sounds normal; no masses,  no organomegaly Extremities: extremities normal, atraumatic, no cyanosis or edema Neurologic: Alert and oriented X 3, normal strength and tone. Normal symmetric reflexes. Normal coordination and gait  ECOG PERFORMANCE STATUS: 1 - Symptomatic but completely ambulatory  Blood pressure 136/73, pulse 79, temperature 97.1 F (36.2 C), temperature source Oral, resp. rate 18, height 5\' 7"  (1.702 m), weight 115 lb 12.8 oz (52.527 kg).  LABORATORY DATA: Lab Results  Component Value Date   WBC 6.1 05/27/2013   HGB 8.2* 05/27/2013   HCT 25.5* 05/27/2013   MCV 90.1 05/27/2013   PLT 203 05/27/2013      Chemistry      Component Value Date/Time   NA 141 05/27/2013 1007   NA 136 04/01/2013 0150   K 4.0 05/27/2013 1007   K 4.1 04/01/2013 0150   CL 100 04/01/2013 0150   CO2 23 05/27/2013 1007    CO2 28 04/01/2013 0150   BUN 16.9 05/27/2013 1007   BUN 48* 04/01/2013 0150   CREATININE 1.0 05/27/2013 1007   CREATININE 2.13* 04/01/2013 0150      Component Value Date/Time   CALCIUM 8.8 05/27/2013 1007   CALCIUM 8.6 04/01/2013 0150   ALKPHOS 105 05/27/2013 1007   ALKPHOS 91 02/24/2013 0716   AST 19 05/27/2013 1007   AST 27 02/24/2013 0716   ALT 8 05/27/2013 1007   ALT 16 02/24/2013 0716   BILITOT 0.28 05/27/2013 1007   BILITOT 0.4 02/24/2013 0716       RADIOGRAPHIC STUDIES: Ct Chest W Contrast  05/25/2013   CLINICAL DATA:  Restaging of metastatic non-small-cell lung cancer. Metastasis to liver and bone.  EXAM: CT CHEST, ABDOMEN, AND PELVIS WITH CONTRAST  TECHNIQUE: Multidetector CT imaging of the chest, abdomen and pelvis was performed following the standard protocol during bolus administration of intravenous contrast.  CONTRAST:  80mL OMNIPAQUE IOHEXOL 300 MG/ML  SOLN  COMPARISON:  Plain film chest of 03/31/2013. PET of 01/19/2013. Diagnostic CTs 01/11/2013.  FINDINGS: CT CHEST FINDINGS  Lungs/Pleura: Left apical pleural parenchymal spiculated opacity . This is not hypermetabolic  on prior PET, favored to represent an area of scarring. Measures 2.6 by 2.0 cm, similar. Image 9 today.  Left base nodularity which is favored to represent an area of atelectasis and measures 9 mm on image 49. Slightly less prominent than at 1.5 cm on the prior.  No new nodules are identified.  Trace right-sided pleural fluid is new.  Heart/Mediastinum: Beam hardening artifact from left-sided pacer battery. A right-sided Port-A-Cath which terminates at the mid SVC.  No supraclavicular adenopathy. The right supraclavicular node described on PET is likely above the level of imaging today.  Advanced aortic and branch vessel atherosclerosis. Mild cardiomegaly, without pericardial effusion. Coronary artery atherosclerosis, including within the LAD. No central pulmonary embolism, on this non-dedicated study.  Right paratracheal  adenopathy is resolved. Resolution of subcarinal adenopathy. A right hilar node measures 1.1 cm versus 1.6 cm on 01/11/2013. Left infrahilar adenopathy is also a resolved.  Anterior mediastinal mass is decreased and nearly resolved. There is minimal residual soft tissue thickening, measuring 1.2 x 2.8 cm on image 24. Decreased from 4.1 x 1.9 cm on the prior exam. Collateral vessels about the posterior chest suggest central venous insufficiency, likely at the left brachiocephalic vein. Similar.  CT ABDOMEN AND PELVIS FINDINGS  Abdomen/Pelvis: Early phase of contrast. No typical findings of hepatic metastasis. Vague hyper enhancement in the subcapsular right lobe of the liver on image 60/series 2. Similar findings poor cephalad on image 55/series 2. Normal spleen. Underdistended proximal stomach. Normal pancreas, gallbladder, biliary tract, adrenal glands. Moderate renal cortical thinning bilaterally. Bilateral renal cysts and too small to characterize lesions.  Aortic atherosclerosis. Infrarenal aneurysmal dilatation at maximally 3.9 x 3.9 cm. This is unchanged. No surrounding hemorrhage. This terminates prior to the aortic bifurcation. No retroperitoneal or retrocrural adenopathy. Normal colon and terminal ileum. Normal small bowel without abdominal ascites.  No pelvic adenopathy. Normal urinary bladder. Moderate prostatomegaly. No significant free fluid.  Bones/Musculoskeletal: Increased sclerosis in the left femoral neck on image 119/series 2. Similar heterogeneous density within the sternal manubrium. Subtle left iliac sclerosis which is not significantly changed.  IMPRESSION: CT CHEST IMPRESSION  1. Response to therapy of thoracic adenopathy. 2. No new or progressive disease identified. 3. New trace right-sided pleural fluid. 4. Left apical and basilar pulmonary opacities which are favored to represent areas of scarring and atelectasis respectively. These warrant followup attention.  CT ABDOMEN AND PELVIS  IMPRESSION  1. Although the liver was imaged in in early arterial phase, no typical findings of hepatic metastasis are seen. Scattered areas of subpleural hyper enhancement which may represent perfusion anomalies. Of note, the prior CT of 01/11/2013 also did not demonstrate typical findings of hepatic metastatic disease. Despite this, extensive metastasis were identified on PET. Attention on followup versus definitive characterization with MRI suggested. 2. No evidence of extrahepatic metastatic disease within the abdomen or pelvis. 3. Similar infrarenal abdominal aortic aneurysm. 4. Increased left femoral neck sclerosis with similar left iliac sclerosis. The left femoral neck change is favored to be related to interval healing of osseous metastasis. No other progressive osseous lesions identified.   Electronically Signed   By: Jeronimo Greaves M.D.   On: 05/25/2013 11:50   Ct Abdomen Pelvis W Contrast  05/25/2013   CLINICAL DATA:  Restaging of metastatic non-small-cell lung cancer. Metastasis to liver and bone.  EXAM: CT CHEST, ABDOMEN, AND PELVIS WITH CONTRAST  TECHNIQUE: Multidetector CT imaging of the chest, abdomen and pelvis was performed following the standard protocol during bolus administration of intravenous  contrast.  CONTRAST:  80mL OMNIPAQUE IOHEXOL 300 MG/ML  SOLN  COMPARISON:  Plain film chest of 03/31/2013. PET of 01/19/2013. Diagnostic CTs 01/11/2013.  FINDINGS: CT CHEST FINDINGS  Lungs/Pleura: Left apical pleural parenchymal spiculated opacity . This is not hypermetabolic on prior PET, favored to represent an area of scarring. Measures 2.6 by 2.0 cm, similar. Image 9 today.  Left base nodularity which is favored to represent an area of atelectasis and measures 9 mm on image 49. Slightly less prominent than at 1.5 cm on the prior.  No new nodules are identified.  Trace right-sided pleural fluid is new.  Heart/Mediastinum: Beam hardening artifact from left-sided pacer battery. A right-sided  Port-A-Cath which terminates at the mid SVC.  No supraclavicular adenopathy. The right supraclavicular node described on PET is likely above the level of imaging today.  Advanced aortic and branch vessel atherosclerosis. Mild cardiomegaly, without pericardial effusion. Coronary artery atherosclerosis, including within the LAD. No central pulmonary embolism, on this non-dedicated study.  Right paratracheal adenopathy is resolved. Resolution of subcarinal adenopathy. A right hilar node measures 1.1 cm versus 1.6 cm on 01/11/2013. Left infrahilar adenopathy is also a resolved.  Anterior mediastinal mass is decreased and nearly resolved. There is minimal residual soft tissue thickening, measuring 1.2 x 2.8 cm on image 24. Decreased from 4.1 x 1.9 cm on the prior exam. Collateral vessels about the posterior chest suggest central venous insufficiency, likely at the left brachiocephalic vein. Similar.  CT ABDOMEN AND PELVIS FINDINGS  Abdomen/Pelvis: Early phase of contrast. No typical findings of hepatic metastasis. Vague hyper enhancement in the subcapsular right lobe of the liver on image 60/series 2. Similar findings poor cephalad on image 55/series 2. Normal spleen. Underdistended proximal stomach. Normal pancreas, gallbladder, biliary tract, adrenal glands. Moderate renal cortical thinning bilaterally. Bilateral renal cysts and too small to characterize lesions.  Aortic atherosclerosis. Infrarenal aneurysmal dilatation at maximally 3.9 x 3.9 cm. This is unchanged. No surrounding hemorrhage. This terminates prior to the aortic bifurcation. No retroperitoneal or retrocrural adenopathy. Normal colon and terminal ileum. Normal small bowel without abdominal ascites.  No pelvic adenopathy. Normal urinary bladder. Moderate prostatomegaly. No significant free fluid.  Bones/Musculoskeletal: Increased sclerosis in the left femoral neck on image 119/series 2. Similar heterogeneous density within the sternal manubrium. Subtle  left iliac sclerosis which is not significantly changed.  IMPRESSION: CT CHEST IMPRESSION  1. Response to therapy of thoracic adenopathy. 2. No new or progressive disease identified. 3. New trace right-sided pleural fluid. 4. Left apical and basilar pulmonary opacities which are favored to represent areas of scarring and atelectasis respectively. These warrant followup attention.  CT ABDOMEN AND PELVIS IMPRESSION  1. Although the liver was imaged in in early arterial phase, no typical findings of hepatic metastasis are seen. Scattered areas of subpleural hyper enhancement which may represent perfusion anomalies. Of note, the prior CT of 01/11/2013 also did not demonstrate typical findings of hepatic metastatic disease. Despite this, extensive metastasis were identified on PET. Attention on followup versus definitive characterization with MRI suggested. 2. No evidence of extrahepatic metastatic disease within the abdomen or pelvis. 3. Similar infrarenal abdominal aortic aneurysm. 4. Increased left femoral neck sclerosis with similar left iliac sclerosis. The left femoral neck change is favored to be related to interval healing of osseous metastasis. No other progressive osseous lesions identified.   Electronically Signed   By: Jeronimo Greaves M.D.   On: 05/25/2013 11:50    ASSESSMENT AND PLAN: this is a very pleasant 72 years  old [place] male with metastatic non-small cell lung cancer, squamous cell carcinoma currently undergoing systemic chemotherapy with carboplatin and paclitaxel with Neulasta support and is status post 3 cycles. Patient was discussed with and also seen by Dr. Arbutus Ped. The CT scan revealed response to therapy of the thoracic adenopathy with no new or progressive disease identified in the abdomen there is some lesions in the liver that were identified on PET scan that did not present with typical findings of a hepatic metastasis. Was advised to followup on subsequent studies versus definitive  characterization with an MRI. There was no evidence of any extrahepatic metastasis within the abdomen or pelvis. Patient is similar infrarenal abdominal aortic aneurysm. There is also some improvement noted in the left femoral neck metastasis no progressive osseous lesions were identified. He will proceed with cycle #4 of his systemic chemotherapy with carboplatin for an AUC of 4 and paclitaxel at 175 mg per meter square given every 3 weeks with Neulasta support as scheduled today. He'll continue with weekly labs consisting of a CBC differential and C. met. He'll followup in 3 weeks with a repeat CBC differential and C. met prior to cycle #5.   Laural Benes, Jacquese Hackman E, PA-C   He was advised to call immediately if he has any concerning symptoms in the interval. The patient voices understanding of current disease status and treatment options and is in agreement with the current care plan.  All questions were answered. The patient knows to call the clinic with any problems, questions or concerns. We can certainly see the patient much sooner if necessary.  ADDENDUM: Hematology/Oncology Attending: I had the face to face encounter with the patient. I recommended his care plan. This is a very pleasant 72 years old Asian male with metastatic non-small cell lung cancer, squamous cell carcinoma status post palliative radiotherapy to the mediastinum and currently undergoing systemic chemotherapy with carboplatin and paclitaxel is status post 3 cycles. The patient tolerated the last cycle of his treatment fairly well with no significant adverse effects. He CT scan showed mild improvement in his disease was no evidence for disease progression. I discussed the scan results with the patient and his interpreter today. I recommended for him to continue his systemic chemotherapy with carboplatin and paclitaxel as scheduled. The patient will start cycle #4 today. He would come back for followup visit in 3 weeks with the next  cycle of his treatment. He was advised to call immediately if he has any concerning symptoms in the interval. Lajuana Matte., MD 05/30/2013

## 2013-05-27 NOTE — Telephone Encounter (Signed)
Per staff message and POF I have scheduled appts.  JMW  

## 2013-05-27 NOTE — Telephone Encounter (Signed)
Gave pt appt for lab,md,injectiions and chemo for November and december 2014

## 2013-05-27 NOTE — Progress Notes (Signed)
Patient was sleeping soundly in the chemotherapy area.  I attempted to wake him by calling his name however, he did not awaken.  His interpreter at his side said that patient feels better than he did.  His weight has increased from 108.7 pounds September 24 to 115.8 pounds November 6.  Nutrition diagnosis: Unintended weight loss improved.  Intervention: Patient will continue to tolerate increased oral intake for repletion. He has my contact information for questions.  Monitoring, evaluation, goals: Patient will have continued weight gain with adequate oral intake.  Next visit: Patient to contact me with further questions or concerns.

## 2013-05-28 ENCOUNTER — Ambulatory Visit (HOSPITAL_BASED_OUTPATIENT_CLINIC_OR_DEPARTMENT_OTHER): Payer: Medicare PPO

## 2013-05-28 ENCOUNTER — Encounter (INDEPENDENT_AMBULATORY_CARE_PROVIDER_SITE_OTHER): Payer: Self-pay

## 2013-05-28 ENCOUNTER — Emergency Department (HOSPITAL_COMMUNITY)
Admission: EM | Admit: 2013-05-28 | Discharge: 2013-05-28 | Disposition: A | Discharge status: Home / Self Care | Payer: Medicare PPO | Attending: Emergency Medicine | Admitting: Emergency Medicine

## 2013-05-28 VITALS — BP 192/100 | HR 70 | Temp 97.6°F | Resp 20

## 2013-05-28 DIAGNOSIS — C3490 Malignant neoplasm of unspecified part of unspecified bronchus or lung: Secondary | ICD-10-CM | POA: Insufficient documentation

## 2013-05-28 DIAGNOSIS — Z79899 Other long term (current) drug therapy: Secondary | ICD-10-CM | POA: Insufficient documentation

## 2013-05-28 DIAGNOSIS — C787 Secondary malignant neoplasm of liver and intrahepatic bile duct: Secondary | ICD-10-CM

## 2013-05-28 DIAGNOSIS — I252 Old myocardial infarction: Secondary | ICD-10-CM | POA: Insufficient documentation

## 2013-05-28 DIAGNOSIS — C7951 Secondary malignant neoplasm of bone: Secondary | ICD-10-CM

## 2013-05-28 DIAGNOSIS — C771 Secondary and unspecified malignant neoplasm of intrathoracic lymph nodes: Secondary | ICD-10-CM

## 2013-05-28 DIAGNOSIS — J449 Chronic obstructive pulmonary disease, unspecified: Secondary | ICD-10-CM | POA: Insufficient documentation

## 2013-05-28 DIAGNOSIS — Z95810 Presence of automatic (implantable) cardiac defibrillator: Secondary | ICD-10-CM | POA: Insufficient documentation

## 2013-05-28 DIAGNOSIS — Z5189 Encounter for other specified aftercare: Secondary | ICD-10-CM

## 2013-05-28 DIAGNOSIS — R599 Enlarged lymph nodes, unspecified: Secondary | ICD-10-CM | POA: Insufficient documentation

## 2013-05-28 DIAGNOSIS — C801 Malignant (primary) neoplasm, unspecified: Secondary | ICD-10-CM | POA: Insufficient documentation

## 2013-05-28 DIAGNOSIS — J4489 Other specified chronic obstructive pulmonary disease: Secondary | ICD-10-CM | POA: Insufficient documentation

## 2013-05-28 DIAGNOSIS — Z9861 Coronary angioplasty status: Secondary | ICD-10-CM | POA: Insufficient documentation

## 2013-05-28 DIAGNOSIS — Z87891 Personal history of nicotine dependence: Secondary | ICD-10-CM | POA: Insufficient documentation

## 2013-05-28 DIAGNOSIS — I10 Essential (primary) hypertension: Secondary | ICD-10-CM

## 2013-05-28 DIAGNOSIS — Z8673 Personal history of transient ischemic attack (TIA), and cerebral infarction without residual deficits: Secondary | ICD-10-CM | POA: Insufficient documentation

## 2013-05-28 MED ORDER — PEGFILGRASTIM INJECTION 6 MG/0.6ML
6.0000 mg | Freq: Once | SUBCUTANEOUS | Status: AC
  Administered 2013-05-28: 6 mg via SUBCUTANEOUS
  Filled 2013-05-28: qty 0.6

## 2013-05-28 MED ORDER — CLONIDINE HCL 0.1 MG PO TABS
0.2000 mg | ORAL_TABLET | Freq: Once | ORAL | Status: AC
  Administered 2013-05-28: 0.2 mg via ORAL

## 2013-05-28 NOTE — Patient Instructions (Signed)
Your CT scan showed improvement in your disease Continue with weekly labs as scheduled Followup in 3 weeks, prior to her next scheduled cycle of chemotherapy

## 2013-05-28 NOTE — ED Provider Notes (Signed)
CSN: [id number]     Arrival date & time 05/28/13  1421 History   First MD Initiated Contact with Patient 05/28/13 1709     Chief Complaint  Patient presents with  . Hypertension   (Consider location/radiation/quality/duration/timing/severity/associated sxs/prior Treatment) HPI.... level V caveat for language barrier.   Patient is being treated for non-small cell lung cancer with metastasis.Marland Kitchen His blood pressure was elevated today at his regular appointment. He was sent to the [facility] emergency department. No chest pain, shortness of breath, neurological deficits. He is presently on Coreg and Altace for his blood pressure  Past Medical History  Diagnosis Date  . Hypertension   . Ischemic cardiomyopathy Sept 2011    EF improved to 35-40% 2D  . COPD (chronic obstructive pulmonary disease)   . LBBB (left bundle branch block)   . Acute anterior myocardial infarction 2011    s/p PCI to LAD; 2.25 x 12 mm BMS  . ICD (implantable cardiac defibrillator) in place   . Anginal pain   . Sleep apnea     "while sleeping last week" (11/12/2012)  . Stroke 2009    "sometimes left side goes numb/weak/gives out" (11/12/2012)  . Pacemaker   . Shortness of breath     Hx: of with exertion   . Headache(784.0)   . Cancer     Lung cancer   Past Surgical History  Procedure Laterality Date  . Bi-ventricular implantable cardioverter defibrillator  (crt-d)  11/12/2012  . Coronary angioplasty with stent placement  02/12/2010    LAD BMS  . Insert / replace / remove pacemaker    . Portacath placement Right 02/24/2013    Procedure: INSERTION PORT-A-CATH;  Surgeon: Alleen Borne, MD;  Location: [facility] OR;  Service: Thoracic;  Laterality: Right;   No family history on file. History  Substance Use Topics  . Smoking status: Former Smoker -- 0.50 packs/day for 45 years    Types: Cigarettes    Quit date: 03/13/2009  . Smokeless tobacco: Never Used  . Alcohol Use: No     Comment: 11/12/2012 "last alcohol 2008; never  had problem w/it"    Review of Systems  Unable to perform ROS: Other    Allergies  Review of patient's allergies indicates no known allergies.  Home Medications   Current Outpatient Rx  Name  Route  Sig  Dispense  Refill  . carvedilol (COREG) 6.25 MG tablet   Oral   Take 0.5 tablets (3.125 mg total) by mouth 2 (two) times daily with a meal.   60 tablet   3   . isosorbide dinitrate (ISORDIL) 10 MG tablet   Oral   Take 10 mg by mouth every 8 (eight) hours.          . ramipril (ALTACE) 2.5 MG capsule   Oral   Take 2.5 mg by mouth daily at 12 noon.         . rosuvastatin (CRESTOR) 20 MG tablet   Oral   Take 20 mg by mouth daily at 12 noon.           BP 144/81  Pulse 82  Temp(Src) 98.3 F (36.8 C) (Oral)  Resp 18  Ht 5\' 6"  (1.676 m)  Wt 115 lb (52.164 kg)  BMI 18.57 kg/m2  SpO2 99% Physical Exam  Nursing note and vitals reviewed. Constitutional: He is oriented to person, place, and time. He appears well-developed and well-nourished.  BP 132/71  HENT:  Head: Normocephalic and atraumatic.  Eyes: Conjunctivae  and EOM are normal. Pupils are equal, round, and reactive to light.  Neck: Normal range of motion. Neck supple.  Cardiovascular: Normal rate, regular rhythm and normal heart sounds.   Pulmonary/Chest: Effort normal and breath sounds normal.  Abdominal: Soft. Bowel sounds are normal.  Musculoskeletal: Normal range of motion.  Neurological: He is alert and oriented to person, place, and time.  Skin: Skin is warm and dry.  Psychiatric: He has a normal mood and affect.    ED Course  Procedures (including critical care time) Labs Review Labs Reviewed - No data to display Imaging Review No results found.  EKG Interpretation   None       MDM   1. 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 Patient has primary care followup.   Donnetta Hutching, MD 05/28/13 1902

## 2013-05-28 NOTE — Progress Notes (Signed)
Pt here for Neulasta injection post chemo.  BP in Right arm  192/100 ,  P  70 .  BP in  Left arm  179/98 ,  P  68.   Pt stated he took one tablet Carvedilol  6.25mg  and  1 tablet  Isordil 10mg  this am.  Pt stated he would take Ramipril 2.5mg  at 12 pm today.  Pt was asymptomatic, and voiced no complaints.  Tiana Loft, PA notified.  Order received for Clonidine 0.2mg  to be given now.  Spoke with pt and interpreter.  Explanations given to pt and interpreter of above info.  Per Dimas Alexandria, PA -  Pt needs to go to his primary today after Neulasta injection for further follow up on increased blood pressure despite medications. Pt stated he goes to [facility] for blood pressure meds.  Written info of pt's BP readings  given to pt along with verbal instructions for pt to go directly to [facility].  Both pt and interpreter voiced understanding.

## 2013-05-28 NOTE — ED Notes (Signed)
Pt c/o high blood pressure, was over at [place], and instructed to come here to talk with MD about his high blood pressure

## 2013-05-28 NOTE — ED Notes (Signed)
MD at bedside. 

## 2013-05-28 NOTE — ED Notes (Addendum)
Pt was at [facility] and sent here for high BP of 192/100. Was given 0.2 mg of clonidine. BP now 132/71. Denies any pain or SOB.

## 2013-06-03 ENCOUNTER — Other Ambulatory Visit (HOSPITAL_BASED_OUTPATIENT_CLINIC_OR_DEPARTMENT_OTHER): Payer: Medicare PPO | Admitting: Lab

## 2013-06-03 DIAGNOSIS — C3490 Malignant neoplasm of unspecified part of unspecified bronchus or lung: Secondary | ICD-10-CM

## 2013-06-03 DIAGNOSIS — C7951 Secondary malignant neoplasm of bone: Secondary | ICD-10-CM

## 2013-06-03 DIAGNOSIS — C787 Secondary malignant neoplasm of liver and intrahepatic bile duct: Secondary | ICD-10-CM

## 2013-06-03 LAB — CBC WITH DIFFERENTIAL/PLATELET
Basophils Absolute: 0 10*3/uL (ref 0.0–0.1)
EOS%: 2.1 % (ref 0.0–7.0)
Eosinophils Absolute: 0.3 10*3/uL (ref 0.0–0.5)
HCT: 26.6 % — ABNORMAL LOW (ref 38.4–49.9)
HGB: 8.5 g/dL — ABNORMAL LOW (ref 13.0–17.1)
MCV: 90.8 fL (ref 79.3–98.0)
MONO#: 1.4 10*3/uL — ABNORMAL HIGH (ref 0.1–0.9)
MONO%: 10.2 % (ref 0.0–14.0)
NEUT#: 11.3 10*3/uL — ABNORMAL HIGH (ref 1.5–6.5)
NEUT%: 82.5 % — ABNORMAL HIGH (ref 39.0–75.0)
Platelets: 141 10*3/uL (ref 140–400)
RBC: 2.93 10*6/uL — ABNORMAL LOW (ref 4.20–5.82)
RDW: 19.2 % — ABNORMAL HIGH (ref 11.0–14.6)
WBC: 13.7 10*3/uL — ABNORMAL HIGH (ref 4.0–10.3)

## 2013-06-03 LAB — COMPREHENSIVE METABOLIC PANEL (CC13)
Albumin: 3.5 g/dL (ref 3.5–5.0)
Anion Gap: 8 mEq/L (ref 3–11)
BUN: 18.8 mg/dL (ref 7.0–26.0)
CO2: 26 mEq/L (ref 22–29)
Calcium: 9.3 mg/dL (ref 8.4–10.4)
Glucose: 94 mg/dl (ref 70–140)
Potassium: 4 mEq/L (ref 3.5–5.1)
Sodium: 142 mEq/L (ref 136–145)
Total Protein: 7.3 g/dL (ref 6.4–8.3)

## 2013-06-10 ENCOUNTER — Other Ambulatory Visit: Payer: Medicare PPO

## 2013-06-10 ENCOUNTER — Encounter: Payer: Self-pay | Admitting: Internal Medicine

## 2013-06-10 NOTE — Progress Notes (Signed)
Put fmla form on nurse's desk °

## 2013-06-11 ENCOUNTER — Encounter: Payer: Self-pay | Admitting: Internal Medicine

## 2013-06-11 NOTE — Progress Notes (Signed)
Faxed fmla form to [phone].

## 2013-06-16 ENCOUNTER — Ambulatory Visit (HOSPITAL_BASED_OUTPATIENT_CLINIC_OR_DEPARTMENT_OTHER): Payer: Medicare PPO

## 2013-06-16 ENCOUNTER — Ambulatory Visit (HOSPITAL_BASED_OUTPATIENT_CLINIC_OR_DEPARTMENT_OTHER): Payer: Medicare PPO | Admitting: Internal Medicine

## 2013-06-16 ENCOUNTER — Encounter: Payer: Self-pay | Admitting: Internal Medicine

## 2013-06-16 ENCOUNTER — Telehealth: Payer: Self-pay | Admitting: Internal Medicine

## 2013-06-16 ENCOUNTER — Other Ambulatory Visit (HOSPITAL_BASED_OUTPATIENT_CLINIC_OR_DEPARTMENT_OTHER): Payer: Medicare PPO | Admitting: Lab

## 2013-06-16 DIAGNOSIS — C7951 Secondary malignant neoplasm of bone: Secondary | ICD-10-CM

## 2013-06-16 DIAGNOSIS — C3490 Malignant neoplasm of unspecified part of unspecified bronchus or lung: Secondary | ICD-10-CM

## 2013-06-16 DIAGNOSIS — C787 Secondary malignant neoplasm of liver and intrahepatic bile duct: Secondary | ICD-10-CM

## 2013-06-16 DIAGNOSIS — Z5111 Encounter for antineoplastic chemotherapy: Secondary | ICD-10-CM

## 2013-06-16 DIAGNOSIS — C771 Secondary and unspecified malignant neoplasm of intrathoracic lymph nodes: Secondary | ICD-10-CM

## 2013-06-16 LAB — COMPREHENSIVE METABOLIC PANEL (CC13)
ALT: 11 U/L (ref 0–55)
Anion Gap: 10 mEq/L (ref 3–11)
BUN: 22.6 mg/dL (ref 7.0–26.0)
CO2: 23 mEq/L (ref 22–29)
Chloride: 107 mEq/L (ref 98–109)
Glucose: 84 mg/dl (ref 70–140)
Sodium: 140 mEq/L (ref 136–145)
Total Bilirubin: 0.41 mg/dL (ref 0.20–1.20)
Total Protein: 7.8 g/dL (ref 6.4–8.3)

## 2013-06-16 LAB — CBC WITH DIFFERENTIAL/PLATELET
BASO%: 0.5 % (ref 0.0–2.0)
Basophils Absolute: 0 10*3/uL (ref 0.0–0.1)
LYMPH%: 11.6 % — ABNORMAL LOW (ref 14.0–49.0)
MCHC: 31.8 g/dL — ABNORMAL LOW (ref 32.0–36.0)
MONO#: 0.9 10*3/uL (ref 0.1–0.9)
RBC: 3.42 10*6/uL — ABNORMAL LOW (ref 4.20–5.82)
RDW: 17.5 % — ABNORMAL HIGH (ref 11.0–14.6)
WBC: 7.4 10*3/uL (ref 4.0–10.3)
lymph#: 0.9 10*3/uL (ref 0.9–3.3)

## 2013-06-16 MED ORDER — HEPARIN SOD (PORK) LOCK FLUSH 100 UNIT/ML IV SOLN
500.0000 [IU] | Freq: Once | INTRAVENOUS | Status: AC | PRN
  Administered 2013-06-16: 500 [IU]
  Filled 2013-06-16: qty 5

## 2013-06-16 MED ORDER — PACLITAXEL CHEMO INJECTION 300 MG/50ML
150.0000 mg/m2 | Freq: Once | INTRAVENOUS | Status: AC
  Administered 2013-06-16: 234 mg via INTRAVENOUS
  Filled 2013-06-16: qty 39

## 2013-06-16 MED ORDER — SODIUM CHLORIDE 0.9 % IV SOLN
Freq: Once | INTRAVENOUS | Status: AC
  Administered 2013-06-16: 11:00:00 via INTRAVENOUS

## 2013-06-16 MED ORDER — ONDANSETRON 16 MG/50ML IVPB (CHCC)
16.0000 mg | Freq: Once | INTRAVENOUS | Status: AC
  Administered 2013-06-16: 16 mg via INTRAVENOUS

## 2013-06-16 MED ORDER — DEXAMETHASONE SODIUM PHOSPHATE 20 MG/5ML IJ SOLN
INTRAMUSCULAR | Status: AC
  Filled 2013-06-16: qty 5

## 2013-06-16 MED ORDER — DIPHENHYDRAMINE HCL 50 MG/ML IJ SOLN
INTRAMUSCULAR | Status: AC
  Filled 2013-06-16: qty 1

## 2013-06-16 MED ORDER — FAMOTIDINE IN NACL 20-0.9 MG/50ML-% IV SOLN
20.0000 mg | Freq: Once | INTRAVENOUS | Status: AC
  Administered 2013-06-16: 20 mg via INTRAVENOUS

## 2013-06-16 MED ORDER — SODIUM CHLORIDE 0.9 % IV SOLN
367.0000 mg | Freq: Once | INTRAVENOUS | Status: AC
  Administered 2013-06-16: 370 mg via INTRAVENOUS
  Filled 2013-06-16: qty 37

## 2013-06-16 MED ORDER — ONDANSETRON 16 MG/50ML IVPB (CHCC)
INTRAVENOUS | Status: AC
  Filled 2013-06-16: qty 16

## 2013-06-16 MED ORDER — FAMOTIDINE IN NACL 20-0.9 MG/50ML-% IV SOLN
INTRAVENOUS | Status: AC
  Filled 2013-06-16: qty 50

## 2013-06-16 MED ORDER — SODIUM CHLORIDE 0.9 % IJ SOLN
10.0000 mL | INTRAMUSCULAR | Status: DC | PRN
  Administered 2013-06-16: 10 mL
  Filled 2013-06-16: qty 10

## 2013-06-16 MED ORDER — DEXAMETHASONE SODIUM PHOSPHATE 20 MG/5ML IJ SOLN
20.0000 mg | Freq: Once | INTRAMUSCULAR | Status: AC
  Administered 2013-06-16: 20 mg via INTRAVENOUS

## 2013-06-16 MED ORDER — DIPHENHYDRAMINE HCL 50 MG/ML IJ SOLN
50.0000 mg | Freq: Once | INTRAMUSCULAR | Status: AC
  Administered 2013-06-16: 50 mg via INTRAVENOUS

## 2013-06-16 NOTE — Telephone Encounter (Signed)
Worked 11/26 POF appts made AVS and CAL given shh

## 2013-06-16 NOTE — Patient Instructions (Signed)
Continue chemotherapy today as scheduled.  Followup visit in 3 weeks with the next cycle of chemotherapy 

## 2013-06-16 NOTE — Patient Instructions (Signed)
[facility] Discharge Instructions for Patients Receiving Chemotherapy  Today you received the following chemotherapy agents Taxol and Carboplatin.  To help prevent nausea and vomiting after your treatment, we encourage you to take your nausea medication.   If you develop nausea and vomiting that is not controlled by your nausea medication, call the clinic.   BELOW ARE SYMPTOMS THAT SHOULD BE REPORTED IMMEDIATELY:  *FEVER GREATER THAN 100.5 F  *CHILLS WITH OR WITHOUT FEVER  NAUSEA AND VOMITING THAT IS NOT CONTROLLED WITH YOUR NAUSEA MEDICATION  *UNUSUAL SHORTNESS OF BREATH  *UNUSUAL BRUISING OR BLEEDING  TENDERNESS IN MOUTH AND THROAT WITH OR WITHOUT PRESENCE OF ULCERS  *URINARY PROBLEMS  *BOWEL PROBLEMS  UNUSUAL RASH Items with * indicate a potential emergency and should be followed up as soon as possible.  Feel free to call the clinic you have any questions or concerns. The clinic phone number is [phone].    

## 2013-06-16 NOTE — Progress Notes (Signed)
[facility] [phone]   [phone]  SHARED VISIT PROGRESS NOTE  Runell Gess, MD [address] [place] [address]  DIAGNOSIS: Metastatic non-small cell lung cancer, squamous cell carcinoma with a large anterior mediastinal mass as well as bilateral lymphadenopathy and liver as well as bone metastases diagnosed in June of 2014   PRIOR THERAPY: Palliative radiotherapy to the manubrium under the care of Dr. Kathrynn Running.   CURRENT THERAPY: Systemic chemotherapy with carboplatin for AUC of 5 and paclitaxel 175 mg/M2 every 3 weeks with Neulasta support. Status post 4 cycles   CHEMOTHERAPY INTENT: Palliative  CURRENT # OF CHEMOTHERAPY CYCLES: 5 CURRENT ANTIEMETICS: Zofran, dexamethasone and Compazine  CURRENT SMOKING STATUS: Former smoker quit 03/13/2009  ORAL CHEMOTHERAPY AND CONSENT: None  CURRENT BISPHOSPHONATES USE: None  PAIN MANAGEMENT: 0/10  NARCOTICS INDUCED CONSTIPATION: None  LIVING WILL AND CODE STATUS: Full code.   INTERVAL HISTORY: Travares Nelles 72 y.o. male returns to the clinic today for follow up visit accompanied by his interpreter. The patient is feeling fine today. He voiced no specific complaints today. He tolerated the last cycle of his systemic chemotherapy with carboplatin and paclitaxel fairly well with no significant adverse effects. He has a good appetite and eats good. He denied having any significant nausea or vomiting. The patient denied having any fever or chills. He has no chest pain, shortness of breath, cough or hemoptysis.   MEDICAL HISTORY: Past Medical History  Diagnosis Date  . Hypertension   . Ischemic cardiomyopathy Sept 2011    EF improved to 35-40% 2D  . COPD (chronic obstructive pulmonary disease)   . LBBB (left bundle branch block)   . Acute anterior myocardial infarction 2011    s/p PCI to LAD; 2.25 x 12 mm BMS  . ICD (implantable cardiac defibrillator) in place   . Anginal pain   .  Sleep apnea     "while sleeping last week" (11/12/2012)  . Stroke 2009    "sometimes left side goes numb/weak/gives out" (11/12/2012)  . Pacemaker   . Shortness of breath     Hx: of with exertion   . Headache(784.0)   . Cancer     Lung cancer    ALLERGIES:  is allergic to oxycodone.  MEDICATIONS:  Current Outpatient Prescriptions  Medication Sig Dispense Refill  . carvedilol (COREG) 6.25 MG tablet Take 0.5 tablets (3.125 mg total) by mouth 2 (two) times daily with a meal.  60 tablet  3  . isosorbide dinitrate (ISORDIL) 10 MG tablet Take 10 mg by mouth every 8 (eight) hours.       . ramipril (ALTACE) 2.5 MG capsule Take 2.5 mg by mouth daily at 12 noon.      . rosuvastatin (CRESTOR) 20 MG tablet Take 20 mg by mouth daily at 12 noon.        No current facility-administered medications for this visit.    SURGICAL HISTORY:  Past Surgical History  Procedure Laterality Date  . Bi-ventricular implantable cardioverter defibrillator  (crt-d)  11/12/2012  . Coronary angioplasty with stent placement  02/12/2010    LAD BMS  . Insert / replace / remove pacemaker    . Portacath placement Right 02/24/2013    Procedure: INSERTION PORT-A-CATH;  Surgeon: Alleen Borne, MD;  Location: [facility] OR;  Service: Thoracic;  Laterality: Right;    REVIEW OF SYSTEMS:  Constitutional: negative Eyes: negative Ears, nose, mouth, throat, and face: negative Respiratory: negative Cardiovascular: negative  Gastrointestinal: negative Genitourinary:negative Integument/breast: negative Hematologic/lymphatic: negative Musculoskeletal:negative Neurological: negative Behavioral/Psych: negative Endocrine: negative Allergic/Immunologic: negative   PHYSICAL EXAMINATION: General appearance: alert, cooperative, fatigued and no distress Head: Normocephalic, without obvious abnormality, atraumatic Neck: no adenopathy, no JVD, supple, symmetrical, trachea midline and thyroid not enlarged, symmetric, no  tenderness/mass/nodules Lymph nodes: Cervical, supraclavicular, and axillary nodes normal. Resp: clear to auscultation bilaterally and normal percussion bilaterally Back: symmetric, no curvature. ROM normal. No CVA tenderness. Cardio: regular rate and rhythm, S1, S2 normal, no murmur, click, rub or gallop GI: soft, non-tender; bowel sounds normal; no masses,  no organomegaly Extremities: extremities normal, atraumatic, no cyanosis or edema Neurologic: Alert and oriented X 3, normal strength and tone. Normal symmetric reflexes. Normal coordination and gait  ECOG PERFORMANCE STATUS: 1 - Symptomatic but completely ambulatory  Blood pressure 120/75, pulse 88, temperature 96.7 F (35.9 C), temperature source Oral, resp. rate 18, height 5\' 6"  (1.676 m), weight 112 lb 8 oz (51.03 kg).  LABORATORY DATA: Lab Results  Component Value Date   WBC 7.4 06/16/2013   HGB 9.8* 06/16/2013   HCT 30.8* 06/16/2013   MCV 90.1 06/16/2013   PLT 252 06/16/2013      Chemistry      Component Value Date/Time   NA 142 06/03/2013 0954   NA 136 04/01/2013 0150   K 4.0 06/03/2013 0954   K 4.1 04/01/2013 0150   CL 100 04/01/2013 0150   CO2 26 06/03/2013 0954   CO2 28 04/01/2013 0150   BUN 18.8 06/03/2013 0954   BUN 48* 04/01/2013 0150   CREATININE 1.0 06/03/2013 0954   CREATININE 2.13* 04/01/2013 0150      Component Value Date/Time   CALCIUM 9.3 06/03/2013 0954   CALCIUM 8.6 04/01/2013 0150   ALKPHOS 176* 06/03/2013 0954   ALKPHOS 91 02/24/2013 0716   AST 25 06/03/2013 0954   AST 27 02/24/2013 0716   ALT 15 06/03/2013 0954   ALT 16 02/24/2013 0716   BILITOT 0.33 06/03/2013 0954   BILITOT 0.4 02/24/2013 0716       RADIOGRAPHIC STUDIES: Ct Chest W Contrast  05/25/2013   CLINICAL DATA:  Restaging of metastatic non-small-cell lung cancer. Metastasis to liver and bone.  EXAM: CT CHEST, ABDOMEN, AND PELVIS WITH CONTRAST  TECHNIQUE: Multidetector CT imaging of the chest, abdomen and pelvis was performed following  the standard protocol during bolus administration of intravenous contrast.  CONTRAST:  80mL OMNIPAQUE IOHEXOL 300 MG/ML  SOLN  COMPARISON:  Plain film chest of 03/31/2013. PET of 01/19/2013. Diagnostic CTs 01/11/2013.  FINDINGS: CT CHEST FINDINGS  Lungs/Pleura: Left apical pleural parenchymal spiculated opacity . This is not hypermetabolic on prior PET, favored to represent an area of scarring. Measures 2.6 by 2.0 cm, similar. Image 9 today.  Left base nodularity which is favored to represent an area of atelectasis and measures 9 mm on image 49. Slightly less prominent than at 1.5 cm on the prior.  No new nodules are identified.  Trace right-sided pleural fluid is new.  Heart/Mediastinum: Beam hardening artifact from left-sided pacer battery. A right-sided Port-A-Cath which terminates at the mid SVC.  No supraclavicular adenopathy. The right supraclavicular node described on PET is likely above the level of imaging today.  Advanced aortic and branch vessel atherosclerosis. Mild cardiomegaly, without pericardial effusion. Coronary artery atherosclerosis, including within the LAD. No central pulmonary embolism, on this non-dedicated study.  Right paratracheal adenopathy is resolved. Resolution of subcarinal adenopathy. A right hilar node measures 1.1 cm versus 1.6 cm on  01/11/2013. Left infrahilar adenopathy is also a resolved.  Anterior mediastinal mass is decreased and nearly resolved. There is minimal residual soft tissue thickening, measuring 1.2 x 2.8 cm on image 24. Decreased from 4.1 x 1.9 cm on the prior exam. Collateral vessels about the posterior chest suggest central venous insufficiency, likely at the left brachiocephalic vein. Similar.  CT ABDOMEN AND PELVIS FINDINGS  Abdomen/Pelvis: Early phase of contrast. No typical findings of hepatic metastasis. Vague hyper enhancement in the subcapsular right lobe of the liver on image 60/series 2. Similar findings poor cephalad on image 55/series 2. Normal spleen.  Underdistended proximal stomach. Normal pancreas, gallbladder, biliary tract, adrenal glands. Moderate renal cortical thinning bilaterally. Bilateral renal cysts and too small to characterize lesions.  Aortic atherosclerosis. Infrarenal aneurysmal dilatation at maximally 3.9 x 3.9 cm. This is unchanged. No surrounding hemorrhage. This terminates prior to the aortic bifurcation. No retroperitoneal or retrocrural adenopathy. Normal colon and terminal ileum. Normal small bowel without abdominal ascites.  No pelvic adenopathy. Normal urinary bladder. Moderate prostatomegaly. No significant free fluid.  Bones/Musculoskeletal: Increased sclerosis in the left femoral neck on image 119/series 2. Similar heterogeneous density within the sternal manubrium. Subtle left iliac sclerosis which is not significantly changed.  IMPRESSION: CT CHEST IMPRESSION  1. Response to therapy of thoracic adenopathy. 2. No new or progressive disease identified. 3. New trace right-sided pleural fluid. 4. Left apical and basilar pulmonary opacities which are favored to represent areas of scarring and atelectasis respectively. These warrant followup attention.  CT ABDOMEN AND PELVIS IMPRESSION  1. Although the liver was imaged in in early arterial phase, no typical findings of hepatic metastasis are seen. Scattered areas of subpleural hyper enhancement which may represent perfusion anomalies. Of note, the prior CT of 01/11/2013 also did not demonstrate typical findings of hepatic metastatic disease. Despite this, extensive metastasis were identified on PET. Attention on followup versus definitive characterization with MRI suggested. 2. No evidence of extrahepatic metastatic disease within the abdomen or pelvis. 3. Similar infrarenal abdominal aortic aneurysm. 4. Increased left femoral neck sclerosis with similar left iliac sclerosis. The left femoral neck change is favored to be related to interval healing of osseous metastasis. No other  progressive osseous lesions identified.   Electronically Signed   By: Jeronimo Greaves M.D.   On: 05/25/2013 11:50   ASSESSMENT AND PLAN: This is a very pleasant 72 years old Asian male with metastatic non-small cell lung cancer, squamous cell carcinoma status post palliative radiotherapy to the mediastinum and currently undergoing systemic chemotherapy with carboplatin and paclitaxel is status post 4 cycles. The patient tolerated the last cycle of his treatment fairly well with no significant adverse effects. He CT scan showed mild improvement in his disease was no evidence for disease progression. I recommended for him to continue his systemic chemotherapy with carboplatin and paclitaxel as scheduled. The patient will start cycle #5 today. He would come back for followup visit in 3 weeks with the next cycle of his treatment.  He was advised to call immediately if he has any concerning symptoms in the interval. The patient voices understanding of current disease status and treatment options and is in agreement with the current care plan.  All questions were answered. The patient knows to call the clinic with any problems, questions or concerns. We can certainly see the patient much sooner if necessary.  Lajuana Matte., MD 06/16/2013

## 2013-06-18 ENCOUNTER — Other Ambulatory Visit: Payer: Medicare PPO

## 2013-06-18 ENCOUNTER — Ambulatory Visit (HOSPITAL_BASED_OUTPATIENT_CLINIC_OR_DEPARTMENT_OTHER): Payer: Medicare PPO

## 2013-06-18 VITALS — BP 186/94 | HR 83 | Temp 97.5°F

## 2013-06-18 DIAGNOSIS — C3490 Malignant neoplasm of unspecified part of unspecified bronchus or lung: Secondary | ICD-10-CM

## 2013-06-18 DIAGNOSIS — Z5189 Encounter for other specified aftercare: Secondary | ICD-10-CM

## 2013-06-18 DIAGNOSIS — C771 Secondary and unspecified malignant neoplasm of intrathoracic lymph nodes: Secondary | ICD-10-CM

## 2013-06-18 MED ORDER — PEGFILGRASTIM INJECTION 6 MG/0.6ML
6.0000 mg | Freq: Once | SUBCUTANEOUS | Status: AC
  Administered 2013-06-18: 6 mg via SUBCUTANEOUS
  Filled 2013-06-18: qty 0.6

## 2013-06-18 NOTE — Patient Instructions (Signed)

## 2013-06-24 ENCOUNTER — Other Ambulatory Visit (HOSPITAL_BASED_OUTPATIENT_CLINIC_OR_DEPARTMENT_OTHER): Payer: Medicare PPO

## 2013-06-24 DIAGNOSIS — C3490 Malignant neoplasm of unspecified part of unspecified bronchus or lung: Secondary | ICD-10-CM

## 2013-06-24 LAB — CBC WITH DIFFERENTIAL/PLATELET
Basophils Absolute: 0.1 10*3/uL (ref 0.0–0.1)
EOS%: 3.3 % (ref 0.0–7.0)
Eosinophils Absolute: 0.4 10*3/uL (ref 0.0–0.5)
HCT: 30.3 % — ABNORMAL LOW (ref 38.4–49.9)
HGB: 9.8 g/dL — ABNORMAL LOW (ref 13.0–17.1)
MCH: 29.4 pg (ref 27.2–33.4)
MCHC: 32.3 g/dL (ref 32.0–36.0)
MONO#: 1.7 10*3/uL — ABNORMAL HIGH (ref 0.1–0.9)
NEUT#: 9.4 10*3/uL — ABNORMAL HIGH (ref 1.5–6.5)
NEUT%: 76.3 % — ABNORMAL HIGH (ref 39.0–75.0)
Platelets: 153 10*3/uL (ref 140–400)
lymph#: 0.8 10*3/uL — ABNORMAL LOW (ref 0.9–3.3)

## 2013-06-24 LAB — COMPREHENSIVE METABOLIC PANEL (CC13)
ALT: 13 U/L (ref 0–55)
AST: 23 U/L (ref 5–34)
Albumin: 3.9 g/dL (ref 3.5–5.0)
Alkaline Phosphatase: 200 U/L — ABNORMAL HIGH (ref 40–150)
Glucose: 87 mg/dl (ref 70–140)
Potassium: 4.2 mEq/L (ref 3.5–5.1)
Sodium: 142 mEq/L (ref 136–145)
Total Protein: 8.3 g/dL (ref 6.4–8.3)

## 2013-07-01 ENCOUNTER — Other Ambulatory Visit: Payer: Medicare PPO

## 2013-07-03 ENCOUNTER — Other Ambulatory Visit: Payer: Self-pay | Admitting: Cardiology

## 2013-07-05 NOTE — Telephone Encounter (Signed)
Rx was sent to pharmacy electronically. 

## 2013-07-07 ENCOUNTER — Ambulatory Visit: Payer: Medicare PPO

## 2013-07-08 ENCOUNTER — Ambulatory Visit (HOSPITAL_BASED_OUTPATIENT_CLINIC_OR_DEPARTMENT_OTHER): Payer: Medicare PPO

## 2013-07-08 ENCOUNTER — Ambulatory Visit (HOSPITAL_BASED_OUTPATIENT_CLINIC_OR_DEPARTMENT_OTHER): Payer: Medicare PPO | Admitting: Physician Assistant

## 2013-07-08 ENCOUNTER — Other Ambulatory Visit: Payer: Medicare PPO | Admitting: Lab

## 2013-07-08 ENCOUNTER — Ambulatory Visit: Payer: Medicare PPO | Admitting: Physician Assistant

## 2013-07-08 ENCOUNTER — Encounter: Payer: Self-pay | Admitting: Physician Assistant

## 2013-07-08 ENCOUNTER — Telehealth: Payer: Self-pay | Admitting: Internal Medicine

## 2013-07-08 ENCOUNTER — Ambulatory Visit: Payer: Medicare Other

## 2013-07-08 ENCOUNTER — Encounter: Payer: Self-pay | Admitting: Internal Medicine

## 2013-07-08 VITALS — BP 134/72 | HR 79 | Temp 97.8°F | Resp 18 | Ht 66.0 in | Wt 113.5 lb

## 2013-07-08 DIAGNOSIS — C787 Secondary malignant neoplasm of liver and intrahepatic bile duct: Secondary | ICD-10-CM

## 2013-07-08 DIAGNOSIS — C7951 Secondary malignant neoplasm of bone: Secondary | ICD-10-CM

## 2013-07-08 DIAGNOSIS — C3490 Malignant neoplasm of unspecified part of unspecified bronchus or lung: Secondary | ICD-10-CM

## 2013-07-08 DIAGNOSIS — Z5111 Encounter for antineoplastic chemotherapy: Secondary | ICD-10-CM

## 2013-07-08 DIAGNOSIS — C771 Secondary and unspecified malignant neoplasm of intrathoracic lymph nodes: Secondary | ICD-10-CM

## 2013-07-08 LAB — CBC WITH DIFFERENTIAL/PLATELET
BASO%: 1.1 % (ref 0.0–2.0)
HGB: 9.1 g/dL — ABNORMAL LOW (ref 13.0–17.1)
LYMPH%: 10.2 % — ABNORMAL LOW (ref 14.0–49.0)
MCH: 29.8 pg (ref 27.2–33.4)
MCHC: 32.7 g/dL (ref 32.0–36.0)
MCV: 91.3 fL (ref 79.3–98.0)
MONO%: 18 % — ABNORMAL HIGH (ref 0.0–14.0)
Platelets: 194 10*3/uL (ref 140–400)
RBC: 3.05 10*6/uL — ABNORMAL LOW (ref 4.20–5.82)
lymph#: 0.6 10*3/uL — ABNORMAL LOW (ref 0.9–3.3)

## 2013-07-08 LAB — COMPREHENSIVE METABOLIC PANEL (CC13)
ALT: 7 U/L (ref 0–55)
AST: 19 U/L (ref 5–34)
Alkaline Phosphatase: 124 U/L (ref 40–150)
Glucose: 100 mg/dl (ref 70–140)
Sodium: 142 mEq/L (ref 136–145)
Total Bilirubin: 0.32 mg/dL (ref 0.20–1.20)
Total Protein: 7.4 g/dL (ref 6.4–8.3)

## 2013-07-08 MED ORDER — SODIUM CHLORIDE 0.9 % IV SOLN
Freq: Once | INTRAVENOUS | Status: AC
  Administered 2013-07-08: 13:00:00 via INTRAVENOUS

## 2013-07-08 MED ORDER — SODIUM CHLORIDE 0.9 % IJ SOLN
10.0000 mL | INTRAMUSCULAR | Status: DC | PRN
  Administered 2013-07-08: 10 mL
  Filled 2013-07-08: qty 10

## 2013-07-08 MED ORDER — DIPHENHYDRAMINE HCL 50 MG/ML IJ SOLN
INTRAMUSCULAR | Status: AC
  Filled 2013-07-08: qty 1

## 2013-07-08 MED ORDER — SODIUM CHLORIDE 0.9 % IV SOLN
311.0000 mg | Freq: Once | INTRAVENOUS | Status: AC
  Administered 2013-07-08: 310 mg via INTRAVENOUS
  Filled 2013-07-08: qty 31

## 2013-07-08 MED ORDER — HEPARIN SOD (PORK) LOCK FLUSH 100 UNIT/ML IV SOLN
500.0000 [IU] | Freq: Once | INTRAVENOUS | Status: AC | PRN
  Administered 2013-07-08: 500 [IU]
  Filled 2013-07-08: qty 5

## 2013-07-08 MED ORDER — SODIUM CHLORIDE 0.9 % IV SOLN
150.0000 mg/m2 | Freq: Once | INTRAVENOUS | Status: AC
  Administered 2013-07-08: 234 mg via INTRAVENOUS
  Filled 2013-07-08: qty 39

## 2013-07-08 MED ORDER — DIPHENHYDRAMINE HCL 50 MG/ML IJ SOLN
50.0000 mg | Freq: Once | INTRAMUSCULAR | Status: AC
  Administered 2013-07-08: 50 mg via INTRAVENOUS

## 2013-07-08 MED ORDER — DEXAMETHASONE SODIUM PHOSPHATE 20 MG/5ML IJ SOLN
INTRAMUSCULAR | Status: AC
  Filled 2013-07-08: qty 5

## 2013-07-08 MED ORDER — FAMOTIDINE IN NACL 20-0.9 MG/50ML-% IV SOLN
20.0000 mg | Freq: Once | INTRAVENOUS | Status: AC
  Administered 2013-07-08: 20 mg via INTRAVENOUS

## 2013-07-08 MED ORDER — ONDANSETRON 16 MG/50ML IVPB (CHCC)
16.0000 mg | Freq: Once | INTRAVENOUS | Status: AC
  Administered 2013-07-08: 16 mg via INTRAVENOUS

## 2013-07-08 MED ORDER — FAMOTIDINE IN NACL 20-0.9 MG/50ML-% IV SOLN
INTRAVENOUS | Status: AC
  Filled 2013-07-08: qty 50

## 2013-07-08 MED ORDER — DEXAMETHASONE SODIUM PHOSPHATE 20 MG/5ML IJ SOLN
20.0000 mg | Freq: Once | INTRAMUSCULAR | Status: AC
  Administered 2013-07-08: 20 mg via INTRAVENOUS

## 2013-07-08 MED ORDER — ONDANSETRON 16 MG/50ML IVPB (CHCC)
INTRAVENOUS | Status: AC
  Filled 2013-07-08: qty 16

## 2013-07-08 NOTE — Patient Instructions (Signed)
[facility] Discharge Instructions for Patients Receiving Chemotherapy  Today you received the following chemotherapy agents Taxol and Carboplatin.  To help prevent nausea and vomiting after your treatment, we encourage you to take your nausea medication.   If you develop nausea and vomiting that is not controlled by your nausea medication, call the clinic.   BELOW ARE SYMPTOMS THAT SHOULD BE REPORTED IMMEDIATELY:  *FEVER GREATER THAN 100.5 F  *CHILLS WITH OR WITHOUT FEVER  NAUSEA AND VOMITING THAT IS NOT CONTROLLED WITH YOUR NAUSEA MEDICATION  *UNUSUAL SHORTNESS OF BREATH  *UNUSUAL BRUISING OR BLEEDING  TENDERNESS IN MOUTH AND THROAT WITH OR WITHOUT PRESENCE OF ULCERS  *URINARY PROBLEMS  *BOWEL PROBLEMS  UNUSUAL RASH Items with * indicate a potential emergency and should be followed up as soon as possible.  Feel free to call the clinic you have any questions or concerns. The clinic phone number is [phone].    

## 2013-07-08 NOTE — Progress Notes (Addendum)
[facility] [phone]   [phone]  SHARED VISIT PROGRESS NOTE  Runell Gess, MD [address] [place] [address]  DIAGNOSIS: Metastatic non-small cell lung cancer, squamous cell carcinoma with a large anterior mediastinal mass as well as bilateral lymphadenopathy and liver as well as bone metastases diagnosed in June of 2014   PRIOR THERAPY: Palliative radiotherapy to the manubrium under the care of Dr. Kathrynn Running.   CURRENT THERAPY: Systemic chemotherapy with carboplatin for AUC of 5 and paclitaxel 175 mg/M2 every 3 weeks with Neulasta support. Status post 5 cycles   CHEMOTHERAPY INTENT: Palliative  CURRENT # OF CHEMOTHERAPY CYCLES: 6 CURRENT ANTIEMETICS: Zofran, dexamethasone and Compazine  CURRENT SMOKING STATUS: Former smoker quit 03/13/2009  ORAL CHEMOTHERAPY AND CONSENT: None  CURRENT BISPHOSPHONATES USE: None  PAIN MANAGEMENT: 0/10  NARCOTICS INDUCED CONSTIPATION: None  LIVING WILL AND CODE STATUS: Full code.   INTERVAL HISTORY: [name] 72 y.o. male returns to the clinic today for follow up visit accompanied by his interpreter. The patient is feeling fine today. He voiced no specific complaints today. He tolerated the last cycle of his systemic chemotherapy with carboplatin and paclitaxel fairly well with no significant adverse effects. He has a good appetite and eats good. He denied having any significant nausea or vomiting. The patient denied having any fever or chills. He has no chest pain, shortness of breath, cough or hemoptysis. He presents to proceed with cycle #6 of his systemic chemotherapy.  MEDICAL HISTORY: Past Medical History  Diagnosis Date  . Hypertension   . Ischemic cardiomyopathy Sept 2011    EF improved to 35-40% 2D  . COPD (chronic obstructive pulmonary disease)   . LBBB (left bundle branch block)   . Acute anterior myocardial infarction 2011    s/p PCI to LAD; 2.25 x 12 mm BMS  . ICD  (implantable cardiac defibrillator) in place   . Anginal pain   . Sleep apnea     "while sleeping last week" (11/12/2012)  . Stroke 2009    "sometimes left side goes numb/weak/gives out" (11/12/2012)  . Pacemaker   . Shortness of breath     Hx: of with exertion   . Headache(784.0)   . Cancer     Lung cancer    ALLERGIES:  is allergic to oxycodone.  MEDICATIONS:  Current Outpatient Prescriptions  Medication Sig Dispense Refill  . carvedilol (COREG) 6.25 MG tablet Take 0.5 tablets (3.125 mg total) by mouth 2 (two) times daily with a meal.  60 tablet  3  . isosorbide dinitrate (ISORDIL) 10 MG tablet take 1 tablet by mouth every 8 hours  90 tablet  4  . ramipril (ALTACE) 2.5 MG capsule Take 2.5 mg by mouth daily at 12 noon.      . rosuvastatin (CRESTOR) 20 MG tablet Take 20 mg by mouth daily at 12 noon.        No current facility-administered medications for this visit.   Facility-Administered Medications Ordered in Other Visits  Medication Dose Route Frequency Provider Last Rate Last Dose  . CARBOplatin (PARAPLATIN) 310 mg in sodium chloride 0.9 % 100 mL chemo infusion  310 mg Intravenous Once Khadim Lundberg E Berdina Cheever, PA-C      . heparin lock flush 100 unit/mL  500 Units Intracatheter Once PRN [name] Slipper, PA-C      . PACLitaxel (TAXOL) 234 mg in sodium chloride 0.9 % 250 mL chemo infusion (> 80mg /m2)  150 mg/m2 (Treatment  Plan Actual) Intravenous Once [name] Slipper, PA-C 96 mL/hr at 07/08/13 1405 234 mg at 07/08/13 1405  . sodium chloride 0.9 % injection 10 mL  10 mL Intracatheter PRN [name] Slipper, PA-C        SURGICAL HISTORY:  Past Surgical History  Procedure Laterality Date  . Bi-ventricular implantable cardioverter defibrillator  (crt-d)  11/12/2012  . Coronary angioplasty with stent placement  02/12/2010    LAD BMS  . Insert / replace / remove pacemaker    . Portacath placement Right 02/24/2013    Procedure: INSERTION PORT-A-CATH;  Surgeon: Alleen Borne, MD;  Location:  [facility] OR;  Service: Thoracic;  Laterality: Right;    REVIEW OF SYSTEMS:  Constitutional: negative Eyes: negative Ears, nose, mouth, throat, and face: negative Respiratory: negative Cardiovascular: negative Gastrointestinal: negative Genitourinary:negative Integument/breast: negative Hematologic/lymphatic: negative Musculoskeletal:negative Neurological: negative Behavioral/Psych: negative Endocrine: negative Allergic/Immunologic: negative   PHYSICAL EXAMINATION: General appearance: alert, cooperative, fatigued and no distress Head: Normocephalic, without obvious abnormality, atraumatic Neck: no adenopathy, no JVD, supple, symmetrical, trachea midline and thyroid not enlarged, symmetric, no tenderness/mass/nodules Lymph nodes: Cervical, supraclavicular, and axillary nodes normal. Resp: clear to auscultation bilaterally and normal percussion bilaterally Back: symmetric, no curvature. ROM normal. No CVA tenderness. Cardio: regular rate and rhythm, S1, S2 normal, no murmur, click, rub or gallop GI: soft, non-tender; bowel sounds normal; no masses,  no organomegaly Extremities: extremities normal, atraumatic, no cyanosis or edema Neurologic: Alert and oriented X 3, normal strength and tone. Normal symmetric reflexes. Normal coordination and gait  ECOG PERFORMANCE STATUS: 1 - Symptomatic but completely ambulatory  Blood pressure 134/72, pulse 79, temperature 97.8 F (36.6 C), temperature source Oral, resp. rate 18, height 5\' 6"  (1.676 m), weight 113 lb 8 oz (51.483 kg).  LABORATORY DATA: Lab Results  Component Value Date   WBC 5.6 07/08/2013   HGB 9.1* 07/08/2013   HCT 27.9* 07/08/2013   MCV 91.3 07/08/2013   PLT 194 07/08/2013      Chemistry      Component Value Date/Time   NA 142 07/08/2013 1033   NA 136 04/01/2013 0150   K 4.1 07/08/2013 1033   K 4.1 04/01/2013 0150   CL 100 04/01/2013 0150   CO2 25 07/08/2013 1033   CO2 28 04/01/2013 0150   BUN 21.0 07/08/2013 1033   BUN  48* 04/01/2013 0150   CREATININE 1.3 07/08/2013 1033   CREATININE 2.13* 04/01/2013 0150      Component Value Date/Time   CALCIUM 9.0 07/08/2013 1033   CALCIUM 8.6 04/01/2013 0150   ALKPHOS 124 07/08/2013 1033   ALKPHOS 91 02/24/2013 0716   AST 19 07/08/2013 1033   AST 27 02/24/2013 0716   ALT 7 07/08/2013 1033   ALT 16 02/24/2013 0716   BILITOT 0.32 07/08/2013 1033   BILITOT 0.4 02/24/2013 0716       RADIOGRAPHIC STUDIES: Ct Chest W Contrast  05/25/2013   CLINICAL DATA:  Restaging of metastatic non-small-cell lung cancer. Metastasis to liver and bone.  EXAM: CT CHEST, ABDOMEN, AND PELVIS WITH CONTRAST  TECHNIQUE: Multidetector CT imaging of the chest, abdomen and pelvis was performed following the standard protocol during bolus administration of intravenous contrast.  CONTRAST:  80mL OMNIPAQUE IOHEXOL 300 MG/ML  SOLN  COMPARISON:  Plain film chest of 03/31/2013. PET of 01/19/2013. Diagnostic CTs 01/11/2013.  FINDINGS: CT CHEST FINDINGS  Lungs/Pleura: Left apical pleural parenchymal spiculated opacity . This is not hypermetabolic on prior PET, favored to represent an area of scarring.  Measures 2.6 by 2.0 cm, similar. Image 9 today.  Left base nodularity which is favored to represent an area of atelectasis and measures 9 mm on image 49. Slightly less prominent than at 1.5 cm on the prior.  No new nodules are identified.  Trace right-sided pleural fluid is new.  Heart/Mediastinum: Beam hardening artifact from left-sided pacer battery. A right-sided Port-A-Cath which terminates at the mid SVC.  No supraclavicular adenopathy. The right supraclavicular node described on PET is likely above the level of imaging today.  Advanced aortic and branch vessel atherosclerosis. Mild cardiomegaly, without pericardial effusion. Coronary artery atherosclerosis, including within the LAD. No central pulmonary embolism, on this non-dedicated study.  Right paratracheal adenopathy is resolved. Resolution of subcarinal adenopathy.  A right hilar node measures 1.1 cm versus 1.6 cm on 01/11/2013. Left infrahilar adenopathy is also a resolved.  Anterior mediastinal mass is decreased and nearly resolved. There is minimal residual soft tissue thickening, measuring 1.2 x 2.8 cm on image 24. Decreased from 4.1 x 1.9 cm on the prior exam. Collateral vessels about the posterior chest suggest central venous insufficiency, likely at the left brachiocephalic vein. Similar.  CT ABDOMEN AND PELVIS FINDINGS  Abdomen/Pelvis: Early phase of contrast. No typical findings of hepatic metastasis. Vague hyper enhancement in the subcapsular right lobe of the liver on image 60/series 2. Similar findings poor cephalad on image 55/series 2. Normal spleen. Underdistended proximal stomach. Normal pancreas, gallbladder, biliary tract, adrenal glands. Moderate renal cortical thinning bilaterally. Bilateral renal cysts and too small to characterize lesions.  Aortic atherosclerosis. Infrarenal aneurysmal dilatation at maximally 3.9 x 3.9 cm. This is unchanged. No surrounding hemorrhage. This terminates prior to the aortic bifurcation. No retroperitoneal or retrocrural adenopathy. Normal colon and terminal ileum. Normal small bowel without abdominal ascites.  No pelvic adenopathy. Normal urinary bladder. Moderate prostatomegaly. No significant free fluid.  Bones/Musculoskeletal: Increased sclerosis in the left femoral neck on image 119/series 2. Similar heterogeneous density within the sternal manubrium. Subtle left iliac sclerosis which is not significantly changed.  IMPRESSION: CT CHEST IMPRESSION  1. Response to therapy of thoracic adenopathy. 2. No new or progressive disease identified. 3. New trace right-sided pleural fluid. 4. Left apical and basilar pulmonary opacities which are favored to represent areas of scarring and atelectasis respectively. These warrant followup attention.  CT ABDOMEN AND PELVIS IMPRESSION  1. Although the liver was imaged in in early arterial  phase, no typical findings of hepatic metastasis are seen. Scattered areas of subpleural hyper enhancement which may represent perfusion anomalies. Of note, the prior CT of 01/11/2013 also did not demonstrate typical findings of hepatic metastatic disease. Despite this, extensive metastasis were identified on PET. Attention on followup versus definitive characterization with MRI suggested. 2. No evidence of extrahepatic metastatic disease within the abdomen or pelvis. 3. Similar infrarenal abdominal aortic aneurysm. 4. Increased left femoral neck sclerosis with similar left iliac sclerosis. The left femoral neck change is favored to be related to interval healing of osseous metastasis. No other progressive osseous lesions identified.   Electronically Signed   By: Jeronimo Greaves M.D.   On: 05/25/2013 11:50   ASSESSMENT AND PLAN: This is a very pleasant 72 years old Asian male with metastatic non-small cell lung cancer, squamous cell carcinoma status post palliative radiotherapy to the mediastinum and currently undergoing systemic chemotherapy with carboplatin and paclitaxel is status post 5 cycles. The patient tolerated the last cycle of his treatment fairly well with no significant adverse effects. His last CT scan showed  mild improvement in his disease was no evidence for disease progression. Patient was discussed with him and also seen by Dr. Arbutus Ped. He will proceed with cycle #6 today as scheduled. He'll follow with Dr. Arbutus Ped in 3 weeks with a restaging CT scan of the chest, abdomen and pelvis with contrast to reevaluate his disease. He was advised to call immediately if he has any concerning symptoms in the interval. The patient voices understanding of current disease status and treatment options and is in agreement with the current care plan.  All questions were answered. The patient knows to call the clinic with any problems, questions or concerns. We can certainly see the patient much sooner if  necessary.  [name] Slipper, PA-C 07/08/2013  ADDENDUM: Hematology/Oncology Attending: I had the face to face encounter with the patient. I recommended for her care plan. This is a very pleasant 72 years old Asian male with metastatic non-small cell lung cancer, squamous cell carcinoma currently undergoing systemic chemotherapy with reduced dose carboplatin and paclitaxel is status post 5 cycles. The patient is tolerating his treatment fairly well with no significant adverse effects. We will proceed today with cycle #6 as scheduled. He would come back for follow up visit in 3 weeks with repeat CT scan of the chest, abdomen and pelvis for restaging of his disease. He was advised to call immediately if he has any concerning symptoms in the interval. Lajuana Matte., MD 07/12/2013

## 2013-07-08 NOTE — Telephone Encounter (Signed)
gv and printed papt sched and avs for pt for DEc and Jan 2015....   sed added tx.

## 2013-07-08 NOTE — Patient Instructions (Signed)
[facility] Discharge Instructions for Patients Receiving Chemotherapy  Today you received the following chemotherapy agents Taxol and Carboplatin.  To help prevent nausea and vomiting after your treatment, we encourage you to take your nausea medication.   If you develop nausea and vomiting that is not controlled by your nausea medication, call the clinic.   BELOW ARE SYMPTOMS THAT SHOULD BE REPORTED IMMEDIATELY:  *FEVER GREATER THAN 100.5 F  *CHILLS WITH OR WITHOUT FEVER  NAUSEA AND VOMITING THAT IS NOT CONTROLLED WITH YOUR NAUSEA MEDICATION  *UNUSUAL SHORTNESS OF BREATH  *UNUSUAL BRUISING OR BLEEDING  TENDERNESS IN MOUTH AND THROAT WITH OR WITHOUT PRESENCE OF ULCERS  *URINARY PROBLEMS  *BOWEL PROBLEMS  UNUSUAL RASH Items with * indicate a potential emergency and should be followed up as soon as possible.  Feel free to call the clinic you have any questions or concerns. The clinic phone number is [phone].    

## 2013-07-09 ENCOUNTER — Ambulatory Visit (HOSPITAL_BASED_OUTPATIENT_CLINIC_OR_DEPARTMENT_OTHER): Payer: Medicare PPO

## 2013-07-09 VITALS — BP 146/84 | HR 104 | Temp 97.7°F

## 2013-07-09 DIAGNOSIS — C771 Secondary and unspecified malignant neoplasm of intrathoracic lymph nodes: Secondary | ICD-10-CM

## 2013-07-09 DIAGNOSIS — C3490 Malignant neoplasm of unspecified part of unspecified bronchus or lung: Secondary | ICD-10-CM

## 2013-07-09 DIAGNOSIS — Z5189 Encounter for other specified aftercare: Secondary | ICD-10-CM

## 2013-07-09 MED ORDER — PEGFILGRASTIM INJECTION 6 MG/0.6ML
6.0000 mg | Freq: Once | SUBCUTANEOUS | Status: AC
  Administered 2013-07-09: 6 mg via SUBCUTANEOUS
  Filled 2013-07-09: qty 0.6

## 2013-07-11 NOTE — Patient Instructions (Signed)
Continue with weekly labs as scheduled  Followup with Dr. Arbutus Ped in 3 weeks with restaging CT scan of your chest, abdomen and pelvis to reevaluate your disease

## 2013-07-16 ENCOUNTER — Other Ambulatory Visit: Payer: Medicare PPO

## 2013-07-20 ENCOUNTER — Telehealth: Payer: Self-pay | Admitting: *Deleted

## 2013-07-20 NOTE — Telephone Encounter (Signed)
Case Manager with [facility] called requesting patient have an earlier appointment.  Reports he is a chronic long term patient she follows.  No vital signs but thinks he may be developing pneumonia.  Reports he is s.o.b with chest congestion, productive cough with yellow sputum, weaker, decreased appetite, tired weak and cold.  Will notify providers.  He received C6 Taxol/Carbo on 07-08-2013 with neulasta support.  Scheduled for CT chest on 07-26-2013 with f/u on 07-29-2013.  Asked that we call patient via mobile number [phone].  Will notify providers.

## 2013-07-21 ENCOUNTER — Telehealth: Payer: Self-pay | Admitting: *Deleted

## 2013-07-21 DIAGNOSIS — C771 Secondary and unspecified malignant neoplasm of intrathoracic lymph nodes: Secondary | ICD-10-CM

## 2013-07-21 MED ORDER — LEVOFLOXACIN 500 MG PO TABS: 500.0000 mg | ORAL_TABLET | Freq: Every day | ORAL | Status: DC

## 2013-07-21 NOTE — Telephone Encounter (Signed)
Spoke with patient with a vietnamese interpreter on the phone.  Case mgr from [facility] called yesterday stating that pt was having some symptoms that were questionable for PNA.  Called and spoke with patient.  Spent 30 minutes reviewing symptoms with him.  He states he does not have a thermometer but feels he has fever and chills.  Asked if he can get a thermometer and he said he cannot.  Asked him to request one when he comes in to [facility] to see if we have any samples he can have.  Pt also states he has been coughing with congestion and yellow sputum.  These symptoms have been going on for 1 week.  Per Dr Donnald Garre, pt can have a rx for levaquin 500mg  daily x 7 days.  Pt was also advised to go to the ED or see his PCP if symptoms do not improve.  Pt also states he has no appetite.  Encouraged small frequent meals.  SLJ

## 2013-07-23 ENCOUNTER — Other Ambulatory Visit: Payer: Medicare PPO

## 2013-07-26 ENCOUNTER — Ambulatory Visit (HOSPITAL_COMMUNITY): Payer: Medicare PPO

## 2013-07-28 ENCOUNTER — Encounter (HOSPITAL_COMMUNITY): Payer: Self-pay

## 2013-07-28 ENCOUNTER — Ambulatory Visit (HOSPITAL_COMMUNITY)
Admission: RE | Admit: 2013-07-28 | Discharge: 2013-07-28 | Disposition: A | Discharge status: Home / Self Care | Payer: Medicare PPO | Source: Ambulatory Visit | Attending: Physician Assistant | Admitting: Physician Assistant

## 2013-07-28 DIAGNOSIS — M948X9 Other specified disorders of cartilage, unspecified sites: Secondary | ICD-10-CM | POA: Insufficient documentation

## 2013-07-28 DIAGNOSIS — I700 Atherosclerosis of aorta: Secondary | ICD-10-CM | POA: Insufficient documentation

## 2013-07-28 DIAGNOSIS — C3490 Malignant neoplasm of unspecified part of unspecified bronchus or lung: Secondary | ICD-10-CM | POA: Insufficient documentation

## 2013-07-28 DIAGNOSIS — I2510 Atherosclerotic heart disease of native coronary artery without angina pectoris: Secondary | ICD-10-CM | POA: Insufficient documentation

## 2013-07-28 DIAGNOSIS — I517 Cardiomegaly: Secondary | ICD-10-CM | POA: Insufficient documentation

## 2013-07-28 DIAGNOSIS — C7951 Secondary malignant neoplasm of bone: Secondary | ICD-10-CM | POA: Insufficient documentation

## 2013-07-28 DIAGNOSIS — I714 Abdominal aortic aneurysm, without rupture, unspecified: Secondary | ICD-10-CM | POA: Insufficient documentation

## 2013-07-28 DIAGNOSIS — C7952 Secondary malignant neoplasm of bone marrow: Secondary | ICD-10-CM

## 2013-07-28 DIAGNOSIS — C787 Secondary malignant neoplasm of liver and intrahepatic bile duct: Secondary | ICD-10-CM | POA: Insufficient documentation

## 2013-07-28 DIAGNOSIS — N289 Disorder of kidney and ureter, unspecified: Secondary | ICD-10-CM | POA: Insufficient documentation

## 2013-07-28 DIAGNOSIS — C771 Secondary and unspecified malignant neoplasm of intrathoracic lymph nodes: Secondary | ICD-10-CM

## 2013-07-28 MED ORDER — IOHEXOL 300 MG/ML  SOLN
80.0000 mL | Freq: Once | INTRAMUSCULAR | Status: AC | PRN
  Administered 2013-07-28: 80 mL via INTRAVENOUS

## 2013-07-29 ENCOUNTER — Ambulatory Visit (HOSPITAL_BASED_OUTPATIENT_CLINIC_OR_DEPARTMENT_OTHER): Payer: Medicare PPO | Admitting: Internal Medicine

## 2013-07-29 ENCOUNTER — Telehealth: Payer: Self-pay | Admitting: Internal Medicine

## 2013-07-29 ENCOUNTER — Ambulatory Visit: Payer: Medicare PPO

## 2013-07-29 ENCOUNTER — Encounter: Payer: Self-pay | Admitting: Internal Medicine

## 2013-07-29 ENCOUNTER — Other Ambulatory Visit (HOSPITAL_BASED_OUTPATIENT_CLINIC_OR_DEPARTMENT_OTHER): Payer: Medicare PPO

## 2013-07-29 VITALS — BP 124/77 | HR 96 | Temp 97.0°F | Resp 17 | Ht 66.0 in | Wt 109.3 lb

## 2013-07-29 DIAGNOSIS — C3490 Malignant neoplasm of unspecified part of unspecified bronchus or lung: Secondary | ICD-10-CM

## 2013-07-29 DIAGNOSIS — C771 Secondary and unspecified malignant neoplasm of intrathoracic lymph nodes: Secondary | ICD-10-CM

## 2013-07-29 DIAGNOSIS — R5381 Other malaise: Secondary | ICD-10-CM

## 2013-07-29 DIAGNOSIS — C787 Secondary malignant neoplasm of liver and intrahepatic bile duct: Secondary | ICD-10-CM

## 2013-07-29 DIAGNOSIS — R5383 Other fatigue: Secondary | ICD-10-CM

## 2013-07-29 DIAGNOSIS — J40 Bronchitis, not specified as acute or chronic: Secondary | ICD-10-CM

## 2013-07-29 LAB — COMPREHENSIVE METABOLIC PANEL (CC13)
ALBUMIN: 2.9 g/dL — AB (ref 3.5–5.0)
ALK PHOS: 120 U/L (ref 40–150)
ALT: 19 U/L (ref 0–55)
AST: 30 U/L (ref 5–34)
Anion Gap: 11 mEq/L (ref 3–11)
BUN: 16.8 mg/dL (ref 7.0–26.0)
CO2: 27 mEq/L (ref 22–29)
Calcium: 9.5 mg/dL (ref 8.4–10.4)
Chloride: 105 mEq/L (ref 98–109)
Creatinine: 1.4 mg/dL — ABNORMAL HIGH (ref 0.7–1.3)
Glucose: 125 mg/dl (ref 70–140)
POTASSIUM: 4 meq/L (ref 3.5–5.1)
SODIUM: 144 meq/L (ref 136–145)
TOTAL PROTEIN: 7.7 g/dL (ref 6.4–8.3)
Total Bilirubin: 0.27 mg/dL (ref 0.20–1.20)

## 2013-07-29 LAB — CBC WITH DIFFERENTIAL/PLATELET
BASO%: 0.5 % (ref 0.0–2.0)
Basophils Absolute: 0 10*3/uL (ref 0.0–0.1)
EOS ABS: 0.1 10*3/uL (ref 0.0–0.5)
EOS%: 1.6 % (ref 0.0–7.0)
HEMATOCRIT: 29.1 % — AB (ref 38.4–49.9)
HGB: 9.4 g/dL — ABNORMAL LOW (ref 13.0–17.1)
LYMPH#: 0.7 10*3/uL — AB (ref 0.9–3.3)
LYMPH%: 8.8 % — AB (ref 14.0–49.0)
MCH: 28.9 pg (ref 27.2–33.4)
MCHC: 32.3 g/dL (ref 32.0–36.0)
MCV: 89.4 fL (ref 79.3–98.0)
MONO#: 1.1 10*3/uL — ABNORMAL HIGH (ref 0.1–0.9)
MONO%: 14.1 % — ABNORMAL HIGH (ref 0.0–14.0)
NEUT%: 75 % (ref 39.0–75.0)
NEUTROS ABS: 5.8 10*3/uL (ref 1.5–6.5)
PLATELETS: 405 10*3/uL — AB (ref 140–400)
RBC: 3.25 10*6/uL — AB (ref 4.20–5.82)
RDW: 16.8 % — ABNORMAL HIGH (ref 11.0–14.6)
WBC: 7.7 10*3/uL (ref 4.0–10.3)

## 2013-07-29 NOTE — Patient Instructions (Signed)
CURRENT THERAPY: Observation.  CHEMOTHERAPY INTENT: Palliative  CURRENT # OF CHEMOTHERAPY CYCLES: 0 CURRENT ANTIEMETICS: Zofran, dexamethasone and Compazine  CURRENT SMOKING STATUS: Former smoker quit 03/13/2009  ORAL CHEMOTHERAPY AND CONSENT: None  CURRENT BISPHOSPHONATES USE: None  PAIN MANAGEMENT: 0/10  NARCOTICS INDUCED CONSTIPATION: None  LIVING WILL AND CODE STATUS: Full code.

## 2013-07-29 NOTE — Addendum Note (Signed)
Addended by: Curt Bears on: 07/29/2013 11:41 AM   Modules accepted: Orders

## 2013-07-29 NOTE — Telephone Encounter (Signed)
Gave pt appt for lab and MD for April 2015, gave pt oral contrast

## 2013-07-29 NOTE — Progress Notes (Signed)
[place] Telephone:([phone]   [phone]  SHARED VISIT PROGRESS NOTE  Lorretta Harp, MD [address] [place]  [address]  DIAGNOSIS: Metastatic non-small cell lung cancer, squamous cell carcinoma with a large anterior mediastinal mass as well as bilateral lymphadenopathy and liver as well as bone metastases diagnosed in June of 2014   PRIOR THERAPY:  1) Palliative radiotherapy to the manubrium under the care of Dr. Tammi Klippel.  2) Systemic chemotherapy with carboplatin for AUC of 5 and paclitaxel 175 mg/M2 every 3 weeks with Neulasta support. Status post 6 cycles   CURRENT THERAPY: Observation.  CHEMOTHERAPY INTENT: Palliative  CURRENT # OF CHEMOTHERAPY CYCLES: 0 CURRENT ANTIEMETICS: Zofran, dexamethasone and Compazine  CURRENT SMOKING STATUS: Former smoker quit 03/13/2009  ORAL CHEMOTHERAPY AND CONSENT: None  CURRENT BISPHOSPHONATES USE: None  PAIN MANAGEMENT: 0/10  NARCOTICS INDUCED CONSTIPATION: None  LIVING WILL AND CODE STATUS: Full code.   INTERVAL HISTORY: [name] 73 y.o. male returns to the clinic today for follow up visit accompanied by his daughter. The patient has been complaining of increasing fatigue and weakness as well as chest congestion and cough recently. He called a few days ago and we suggested for him to start treatment with Levaquin but the patient did not start treatment and the waited for the visit today he also has pain at the lower rib cage bilaterally secondary to cough. He tolerated the last cycle of his systemic chemotherapy with carboplatin and paclitaxel fairly well with no significant adverse effects. He denied having any significant nausea or vomiting. The patient denied having any fever or chills. He has no shortness of breath except with exertion, or hemoptysis. He had repeat CT scan of the chest, abdomen and pelvis performed recently and he is here for evaluation and discussion of his scan  results.  MEDICAL HISTORY: Past Medical History  Diagnosis Date  . Hypertension   . Ischemic cardiomyopathy Sept 2011    EF improved to 35-40% 2D  . COPD (chronic obstructive pulmonary disease)   . LBBB (left bundle branch block)   . Acute anterior myocardial infarction 2011    s/p PCI to LAD; 2.25 x 12 mm BMS  . ICD (implantable cardiac defibrillator) in place   . Anginal pain   . Sleep apnea     "while sleeping last week" (11/12/2012)  . Stroke 2009    "sometimes left side goes numb/weak/gives out" (11/12/2012)  . Pacemaker   . Shortness of breath     Hx: of with exertion   . Headache(784.0)   . Cancer     Lung cancer    ALLERGIES:  is allergic to oxycodone.  MEDICATIONS:  Current Outpatient Prescriptions  Medication Sig Dispense Refill  . carvedilol (COREG) 6.25 MG tablet Take 0.5 tablets (3.125 mg total) by mouth 2 (two) times daily with a meal.  60 tablet  3  . isosorbide dinitrate (ISORDIL) 10 MG tablet take 1 tablet by mouth every 8 hours  90 tablet  4  . levofloxacin (LEVAQUIN) 500 MG tablet Take 1 tablet (500 mg total) by mouth daily. TAKE 500MG  DAILY X 7 DAYS  7 tablet  0  . ramipril (ALTACE) 2.5 MG capsule Take 2.5 mg by mouth daily at 12 noon.      . rosuvastatin (CRESTOR) 20 MG tablet Take 20 mg by mouth daily at 12 noon.        No current facility-administered medications for this visit.  SURGICAL HISTORY:  Past Surgical History  Procedure Laterality Date  . Bi-ventricular implantable cardioverter defibrillator  (crt-d)  11/12/2012  . Coronary angioplasty with stent placement  02/12/2010    LAD BMS  . Insert / replace / remove pacemaker    . Portacath placement Right 02/24/2013    Procedure: INSERTION PORT-A-CATH;  Surgeon: Gaye Pollack, MD;  Location: [facility] OR;  Service: Thoracic;  Laterality: Right;    REVIEW OF SYSTEMS:  Constitutional: positive for fatigue Eyes: negative Ears, nose, mouth, throat, and face: negative Respiratory: positive for cough,  dyspnea on exertion and sputum Cardiovascular: negative Gastrointestinal: negative Genitourinary:negative Integument/breast: negative Hematologic/lymphatic: negative Musculoskeletal:negative Neurological: negative Behavioral/Psych: negative Endocrine: negative Allergic/Immunologic: negative   PHYSICAL EXAMINATION: General appearance: alert, cooperative, fatigued and no distress Head: Normocephalic, without obvious abnormality, atraumatic Neck: no adenopathy, no JVD, supple, symmetrical, trachea midline and thyroid not enlarged, symmetric, no tenderness/mass/nodules Lymph nodes: Cervical, supraclavicular, and axillary nodes normal. Resp: clear to auscultation bilaterally and normal percussion bilaterally Back: symmetric, no curvature. ROM normal. No CVA tenderness. Cardio: regular rate and rhythm, S1, S2 normal, no murmur, click, rub or gallop GI: soft, non-tender; bowel sounds normal; no masses,  no organomegaly Extremities: extremities normal, atraumatic, no cyanosis or edema Neurologic: Alert and oriented X 3, normal strength and tone. Normal symmetric reflexes. Normal coordination and gait  ECOG PERFORMANCE STATUS: 1 - Symptomatic but completely ambulatory  Blood pressure 124/77, pulse 96, temperature 97 F (36.1 C), temperature source Oral, resp. rate 17, height 5\' 6"  (1.676 m), weight 109 lb 4.8 oz (49.578 kg), SpO2 96.00%.  LABORATORY DATA: Lab Results  Component Value Date   WBC 7.7 07/29/2013   HGB 9.4* 07/29/2013   HCT 29.1* 07/29/2013   MCV 89.4 07/29/2013   PLT 405* 07/29/2013      Chemistry      Component Value Date/Time   NA 142 07/08/2013 1033   NA 136 04/01/2013 0150   K 4.1 07/08/2013 1033   K 4.1 04/01/2013 0150   CL 100 04/01/2013 0150   CO2 25 07/08/2013 1033   CO2 28 04/01/2013 0150   BUN 21.0 07/08/2013 1033   BUN 48* 04/01/2013 0150   CREATININE 1.3 07/08/2013 1033   CREATININE 2.13* 04/01/2013 0150      Component Value Date/Time   CALCIUM 9.0 07/08/2013  1033   CALCIUM 8.6 04/01/2013 0150   ALKPHOS 124 07/08/2013 1033   ALKPHOS 91 02/24/2013 0716   AST 19 07/08/2013 1033   AST 27 02/24/2013 0716   ALT 7 07/08/2013 1033   ALT 16 02/24/2013 0716   BILITOT 0.32 07/08/2013 1033   BILITOT 0.4 02/24/2013 0716       RADIOGRAPHIC STUDIES: Ct Chest W Contrast  07/28/2013   CLINICAL DATA:  Non-small-cell lung cancer with liver and bone metastasis diagnosed 4/14. Ongoing chemotherapy and prior radiation therapy. Cough.  EXAM: CT CHEST, ABDOMEN, AND PELVIS WITH CONTRAST  TECHNIQUE: Multidetector CT imaging of the chest, abdomen and pelvis was performed following the standard protocol during bolus administration of intravenous contrast.  CONTRAST:  43mL OMNIPAQUE IOHEXOL 300 MG/ML  SOLN  COMPARISON:  CT CHEST W/CM dated 05/25/2013; DG CHEST 1 VIEW dated 03/31/2013; CT ABD/PELVIS W CM dated 05/25/2013; NM PET IMAGE INITIAL (PI) SKULL BASE TO THIGH dated 01/19/2013  FINDINGS:   CT CHEST FINDINGS  Lungs/Pleura: Mild motion degradation. New areas of left upper lobe ground-glass and airspace opacity with suggestion of septal thickening. Example in the posterior right upper lobe not 2.0 cm  on image 21. In the medial right apex at 2.6 cm on image 16.  Similar left apical spiculated opacity which is likely an area of scarring. Measures maximally 2.9 x 1.9 cm on image 10 versus 2.6 x 2.0 cm on the prior.  Atelectasis or scarring at the left lung base. Less nodular than on the prior.  No pleural fluid.  Heart/Mediastinum: Pacer/AICD device. A right-sided Port-A-Cath which terminates at the low SVC.  Aortic and branch vessel atherosclerosis. Suspect central venous insufficiency, likely at the left brachiocephalic vein. Resultant prominence of the azygos vein with collateral vessels about the posterior chest wall. This appearance is similar.  Cardiomegaly, with coronary artery atherosclerosis. No pericardial effusion. No central pulmonary embolism, on this non-dedicated study. No middle  mediastinal or hilar adenopathy. Minimal prevascular soft tissue thickening which measures 1.0 cm on image 23 versus 1.2 cm on the prior.    CT ABDOMEN AND PELVIS FINDINGS  Abdomen/Pelvis: Minimal motion degradation in the upper abdomen. Probable perfusion anomaly in the right lobe of the liver again identified on image 56/ series 2. Similar findings more inferiorly in the right lobe on image 61/series 2.  Normal spleen, stomach. Pancreatic parenchymal calcifications. Normal gallbladder, biliary tract, adrenal glands. Low-density bilateral renal lesions with mild bilateral renal cortical thinning. Cysts and too small to characterize foci. Aortic and branch vessel atherosclerosis. Infrarenal abdominal aortic aneurysm which measures 3.8 x 3.7 cm on image 73. No retroperitoneal or retrocrural adenopathy. Normal colon and terminal ileum. Normal small bowel without abdominal ascites. No evidence of omental or peritoneal disease. No pelvic adenopathy. Normal urinary bladder and prostate. No significant free fluid.  Bones/Musculoskeletal: Left femoral neck sclerosis which measures 2.1 cm and is unchanged. Similar subtle left iliac sclerosis on image 96. Similar sclerosis about the upper thoracic vertebral bodies. This is likely vascular in etiology. Prominent disc bulge at L4-5    IMPRESSION: CT CHEST IMPRESSION  1. No residual thoracic adenopathy. Further decrease in prevascular nodal tissue. 2. Similar appearance of central venous insufficiency, likely in the left brachiocephalic vein. 3. Development of patchy right upper lobe airspace and ground-glass opacities with septal thickening. Considerations include atypical infection, drug toxicity, and in the appropriate clinical setting, radiation pneumonitis.  CT ABDOMEN AND PELVIS IMPRESSION  1. Similar appearance of foci of arterial hyper enhancement within the liver. Likely perfusion anomalies. As detailed on the prior exam, metastatic disease on prior PET was not  readily apparent on CT. 2. Otherwise, no evidence of extraosseous metastasis within the abdomen/pelvis. 3. Similar abdominal aortic aneurysm. 4. Similar left femoral neck and iliac sclerosis.   Electronically Signed   By: Abigail Miyamoto M.D.   On: 07/28/2013 14:25   ASSESSMENT AND PLAN: This is a very pleasant 73 years old Asian male with metastatic non-small cell lung cancer, squamous cell carcinoma status post palliative radiotherapy to the mediastinum and currently undergoing systemic chemotherapy with carboplatin and paclitaxel is status post 6 cycles. The patient tolerated the last cycle of his treatment fairly well with no significant adverse effects. His recent CT scan showed further improvement his disease with no evidence for disease progression. I discussed the scan results with the patient and his daughter today. I recommended for him to take a break of chemotherapy and continue on observation for now with repeat CT scan of the chest, abdomen and pelvis in 3 months for restaging of his disease. For the current symptoms and bronchitis, I advised the patient to start his treatment with Levaquin 500 mg  by mouth daily for 7 days. He was advised to call me or his primary care physician if he has no improvement in his symptoms in the next 1-2 weeks He was advised to call immediately if he has any concerning symptoms in the interval. The patient voices understanding of current disease status and treatment options and is in agreement with the current care plan.  All questions were answered. The patient knows to call the clinic with any problems, questions or concerns. We can certainly see the patient much sooner if necessary.  Eilleen Kempf., MD 07/29/2013

## 2013-07-30 ENCOUNTER — Ambulatory Visit: Payer: Medicare PPO

## 2013-09-14 ENCOUNTER — Telehealth: Payer: Self-pay | Admitting: Medical Oncology

## 2013-09-14 NOTE — Telephone Encounter (Signed)
[employer] nurse asking if pt under hospice . Pt mentions [facility]. i told Deanne pt was not on Hospice.

## 2013-10-22 ENCOUNTER — Encounter (HOSPITAL_COMMUNITY): Payer: Self-pay

## 2013-10-22 ENCOUNTER — Other Ambulatory Visit (HOSPITAL_BASED_OUTPATIENT_CLINIC_OR_DEPARTMENT_OTHER): Payer: Medicare PPO

## 2013-10-22 ENCOUNTER — Ambulatory Visit (HOSPITAL_COMMUNITY)
Admission: RE | Admit: 2013-10-22 | Discharge: 2013-10-22 | Disposition: A | Discharge status: Home / Self Care | Payer: Medicare PPO | Source: Ambulatory Visit | Attending: Internal Medicine | Admitting: Internal Medicine

## 2013-10-22 DIAGNOSIS — C3490 Malignant neoplasm of unspecified part of unspecified bronchus or lung: Secondary | ICD-10-CM

## 2013-10-22 DIAGNOSIS — K861 Other chronic pancreatitis: Secondary | ICD-10-CM | POA: Insufficient documentation

## 2013-10-22 DIAGNOSIS — I2584 Coronary atherosclerosis due to calcified coronary lesion: Secondary | ICD-10-CM | POA: Insufficient documentation

## 2013-10-22 DIAGNOSIS — N400 Enlarged prostate without lower urinary tract symptoms: Secondary | ICD-10-CM | POA: Insufficient documentation

## 2013-10-22 DIAGNOSIS — C787 Secondary malignant neoplasm of liver and intrahepatic bile duct: Secondary | ICD-10-CM

## 2013-10-22 DIAGNOSIS — Z923 Personal history of irradiation: Secondary | ICD-10-CM | POA: Insufficient documentation

## 2013-10-22 DIAGNOSIS — Z9221 Personal history of antineoplastic chemotherapy: Secondary | ICD-10-CM | POA: Insufficient documentation

## 2013-10-22 DIAGNOSIS — R221 Localized swelling, mass and lump, neck: Secondary | ICD-10-CM

## 2013-10-22 DIAGNOSIS — I719 Aortic aneurysm of unspecified site, without rupture: Secondary | ICD-10-CM | POA: Insufficient documentation

## 2013-10-22 DIAGNOSIS — R220 Localized swelling, mass and lump, head: Secondary | ICD-10-CM | POA: Insufficient documentation

## 2013-10-22 DIAGNOSIS — C771 Secondary and unspecified malignant neoplasm of intrathoracic lymph nodes: Secondary | ICD-10-CM

## 2013-10-22 DIAGNOSIS — K7689 Other specified diseases of liver: Secondary | ICD-10-CM | POA: Insufficient documentation

## 2013-10-22 LAB — COMPREHENSIVE METABOLIC PANEL (CC13)
ALK PHOS: 179 U/L — AB (ref 40–150)
ALT: 9 U/L (ref 0–55)
ANION GAP: 11 meq/L (ref 3–11)
AST: 31 U/L (ref 5–34)
Albumin: 3.1 g/dL — ABNORMAL LOW (ref 3.5–5.0)
BILIRUBIN TOTAL: 0.39 mg/dL (ref 0.20–1.20)
BUN: 19.1 mg/dL (ref 7.0–26.0)
CO2: 25 mEq/L (ref 22–29)
CREATININE: 1.3 mg/dL (ref 0.7–1.3)
Calcium: 9.5 mg/dL (ref 8.4–10.4)
Chloride: 104 mEq/L (ref 98–109)
GLUCOSE: 77 mg/dL (ref 70–140)
Potassium: 4.6 mEq/L (ref 3.5–5.1)
Sodium: 141 mEq/L (ref 136–145)
TOTAL PROTEIN: 8.6 g/dL — AB (ref 6.4–8.3)

## 2013-10-22 LAB — CBC WITH DIFFERENTIAL/PLATELET
BASO%: 0.2 % (ref 0.0–2.0)
Basophils Absolute: 0 10*3/uL (ref 0.0–0.1)
EOS%: 1.2 % (ref 0.0–7.0)
Eosinophils Absolute: 0.1 10*3/uL (ref 0.0–0.5)
HEMATOCRIT: 29 % — AB (ref 38.4–49.9)
HGB: 9.1 g/dL — ABNORMAL LOW (ref 13.0–17.1)
LYMPH%: 12.8 % — AB (ref 14.0–49.0)
MCH: 25.5 pg — ABNORMAL LOW (ref 27.2–33.4)
MCHC: 31.2 g/dL — AB (ref 32.0–36.0)
MCV: 81.6 fL (ref 79.3–98.0)
MONO#: 0.8 10*3/uL (ref 0.1–0.9)
MONO%: 11.8 % (ref 0.0–14.0)
NEUT#: 5.1 10*3/uL (ref 1.5–6.5)
NEUT%: 74 % (ref 39.0–75.0)
PLATELETS: 233 10*3/uL (ref 140–400)
RBC: 3.56 10*6/uL — ABNORMAL LOW (ref 4.20–5.82)
RDW: 15.5 % — ABNORMAL HIGH (ref 11.0–14.6)
WBC: 6.9 10*3/uL (ref 4.0–10.3)
lymph#: 0.9 10*3/uL (ref 0.9–3.3)

## 2013-10-22 MED ORDER — IOHEXOL 300 MG/ML  SOLN
100.0000 mL | Freq: Once | INTRAMUSCULAR | Status: AC | PRN
  Administered 2013-10-22: 100 mL via INTRAVENOUS

## 2013-10-27 ENCOUNTER — Encounter: Payer: Self-pay | Admitting: Internal Medicine

## 2013-10-27 ENCOUNTER — Ambulatory Visit (HOSPITAL_BASED_OUTPATIENT_CLINIC_OR_DEPARTMENT_OTHER): Payer: Medicare PPO | Admitting: Internal Medicine

## 2013-10-27 ENCOUNTER — Telehealth: Payer: Self-pay | Admitting: Internal Medicine

## 2013-10-27 VITALS — BP 131/73 | HR 86 | Temp 97.9°F | Resp 18 | Ht 66.0 in | Wt 112.8 lb

## 2013-10-27 DIAGNOSIS — C7951 Secondary malignant neoplasm of bone: Secondary | ICD-10-CM

## 2013-10-27 DIAGNOSIS — C3490 Malignant neoplasm of unspecified part of unspecified bronchus or lung: Secondary | ICD-10-CM

## 2013-10-27 DIAGNOSIS — C771 Secondary and unspecified malignant neoplasm of intrathoracic lymph nodes: Secondary | ICD-10-CM

## 2013-10-27 DIAGNOSIS — C7952 Secondary malignant neoplasm of bone marrow: Secondary | ICD-10-CM

## 2013-10-27 DIAGNOSIS — C787 Secondary malignant neoplasm of liver and intrahepatic bile duct: Secondary | ICD-10-CM

## 2013-10-27 NOTE — Progress Notes (Signed)
[facility] [place] Telephone:(336) [phone]   Fax:(336) [phone]  OFFICE VISIT PROGRESS NOTE  [name] Harp, MD [address] [place] [address]  DIAGNOSIS: Metastatic non-small cell lung cancer, squamous cell carcinoma with a large anterior mediastinal mass as well as bilateral lymphadenopathy and liver as well as bone metastases diagnosed in June of 2014   PRIOR THERAPY:  1) Palliative radiotherapy to the manubrium under the care of Dr. Tammi Klippel.  2) Systemic chemotherapy with carboplatin for AUC of 5 and paclitaxel 175 mg/M2 every 3 weeks with Neulasta support. Status post 6 cycles   CURRENT THERAPY: Observation.  CHEMOTHERAPY INTENT: Palliative  CURRENT # OF CHEMOTHERAPY CYCLES: 0 CURRENT ANTIEMETICS: Zofran, dexamethasone and Compazine  CURRENT SMOKING STATUS: Former smoker quit 03/13/2009  ORAL CHEMOTHERAPY AND CONSENT: None  CURRENT BISPHOSPHONATES USE: None  PAIN MANAGEMENT: 0/10  NARCOTICS INDUCED CONSTIPATION: None  LIVING WILL AND CODE STATUS: Full code.   INTERVAL HISTORY: Abrar Bilton 73 y.o. male returns to the clinic today for follow up visit accompanied by his daughter and his interpreter. The patient is feeling very well today with no specific complaints except for mild fatigue. He denied having any significant weight loss or night sweats. He denied having any significant nausea or vomiting. The patient denied having any fever or chills. He has no shortness of breath except with exertion, or hemoptysis. He has been observation for the last few months and he is here today for evaluation after repeating CT scan of the chest, abdomen and pelvis for discussion of his scan results and treatment options.  MEDICAL HISTORY: Past Medical History  Diagnosis Date  . Hypertension   . Ischemic cardiomyopathy Sept 2011    EF improved to 35-40% 2D  . COPD (chronic obstructive pulmonary disease)   . LBBB (left bundle branch block)   . Acute  anterior myocardial infarction 2011    s/p PCI to LAD; 2.25 x 12 mm BMS  . ICD (implantable cardiac defibrillator) in place   . Anginal pain   . Sleep apnea     "while sleeping last week" (11/12/2012)  . Stroke 2009    "sometimes left side goes numb/weak/gives out" (11/12/2012)  . Pacemaker   . Shortness of breath     Hx: of with exertion   . Headache(784.0)   . Cancer     Lung cancer    ALLERGIES:  is allergic to oxycodone.  MEDICATIONS:  Current Outpatient Prescriptions  Medication Sig Dispense Refill  . carvedilol (COREG) 6.25 MG tablet Take 0.5 tablets (3.125 mg total) by mouth 2 (two) times daily with a meal.  60 tablet  3  . isosorbide dinitrate (ISORDIL) 10 MG tablet take 1 tablet by mouth every 8 hours  90 tablet  4  . ramipril (ALTACE) 2.5 MG capsule Take 2.5 mg by mouth daily at 12 noon.      . rosuvastatin (CRESTOR) 20 MG tablet Take 20 mg by mouth daily at 12 noon.        No current facility-administered medications for this visit.    SURGICAL HISTORY:  Past Surgical History  Procedure Laterality Date  . Bi-ventricular implantable cardioverter defibrillator  (crt-d)  11/12/2012  . Coronary angioplasty with stent placement  02/12/2010    LAD BMS  . Insert / replace / remove pacemaker    . Portacath placement Right 02/24/2013    Procedure: INSERTION PORT-A-CATH;  Surgeon: Gaye Pollack, MD;  Location: [facility];  Service:  Thoracic;  Laterality: Right;    REVIEW OF SYSTEMS:  Constitutional: positive for fatigue Eyes: negative Ears, nose, mouth, throat, and face: negative Respiratory: positive for cough, dyspnea on exertion and sputum Cardiovascular: negative Gastrointestinal: negative Genitourinary:negative Integument/breast: negative Hematologic/lymphatic: negative Musculoskeletal:negative Neurological: negative Behavioral/Psych: negative Endocrine: negative Allergic/Immunologic: negative   PHYSICAL EXAMINATION: General appearance: alert, cooperative, fatigued  and no distress Head: Normocephalic, without obvious abnormality, atraumatic Neck: no adenopathy, no JVD, supple, symmetrical, trachea midline and thyroid not enlarged, symmetric, no tenderness/mass/nodules Lymph nodes: Cervical, supraclavicular, and axillary nodes normal. Resp: clear to auscultation bilaterally and normal percussion bilaterally Back: symmetric, no curvature. ROM normal. No CVA tenderness. Cardio: regular rate and rhythm, S1, S2 normal, no murmur, click, rub or gallop GI: soft, non-tender; bowel sounds normal; no masses,  no organomegaly Extremities: extremities normal, atraumatic, no cyanosis or edema Neurologic: Alert and oriented X 3, normal strength and tone. Normal symmetric reflexes. Normal coordination and gait  ECOG PERFORMANCE STATUS: 1 - Symptomatic but completely ambulatory  Blood pressure 131/73, pulse 86, temperature 97.9 F (36.6 C), temperature source Oral, resp. rate 18, height 5\' 6"  (1.676 m), weight 112 lb 12.8 oz (51.166 kg).  LABORATORY DATA: Lab Results  Component Value Date   WBC 6.9 10/22/2013   HGB 9.1* 10/22/2013   HCT 29.0* 10/22/2013   MCV 81.6 10/22/2013   PLT 233 10/22/2013      Chemistry      Component Value Date/Time   NA 141 10/22/2013 1252   NA 136 04/01/2013 0150   K 4.6 10/22/2013 1252   K 4.1 04/01/2013 0150   CL 100 04/01/2013 0150   CO2 25 10/22/2013 1252   CO2 28 04/01/2013 0150   BUN 19.1 10/22/2013 1252   BUN 48* 04/01/2013 0150   CREATININE 1.3 10/22/2013 1252   CREATININE 2.13* 04/01/2013 0150      Component Value Date/Time   CALCIUM 9.5 10/22/2013 1252   CALCIUM 8.6 04/01/2013 0150   ALKPHOS 179* 10/22/2013 1252   ALKPHOS 91 02/24/2013 0716   AST 31 10/22/2013 1252   AST 27 02/24/2013 0716   ALT 9 10/22/2013 1252   ALT 16 02/24/2013 0716   BILITOT 0.39 10/22/2013 1252   BILITOT 0.4 02/24/2013 0716       RADIOGRAPHIC STUDIES: Ct Chest W Contrast  10/22/2013   CLINICAL DATA:  Metastatic lung cancer. Chemotherapy and radiation therapy complete.   EXAM: CT CHEST, ABDOMEN, AND PELVIS WITH CONTRAST  TECHNIQUE: Multidetector CT imaging of the chest, abdomen and pelvis was performed following the standard protocol during bolus administration of intravenous contrast.  CONTRAST:  116mL OMNIPAQUE IOHEXOL 300 MG/ML  SOLN  COMPARISON:  CT ABD/PELVIS W CM dated 07/28/2013; NM PET IMAGE INITIAL (PI) SKULL BASE TO THIGH dated 01/19/2013  FINDINGS:   CT CHEST FINDINGS  Extensive collateral venous flow is seen in the mediastinum and paraspinal regions secondary to marked stenosis/ occlusion of the left brachiocephalic vein. No pathologically enlarged mediastinal, hilar or axillary lymph nodes. Coronary artery calcification. Heart is mildly enlarged. No pericardial effusion.  A spiculated mass in the apical portion of the left upper lobe measures approximately 2.8 x 3.2 cm, stable when remeasured on the prior exam. Evolving changes of radiation therapy are seen in the juxta mediastinal portions of both upper lobes. Probable subpleural scarring in left lower lobe (image 51), unchanged. Calcified granulomas in the right upper lobe. No pleural fluid. Airway is unremarkable.    CT ABDOMEN AND PELVIS FINDINGS  Liver is heterogeneous and  contains new low-attenuation lesions, measuring up to 1.6 cm in the right hepatic lobe. Previously seen wedge-shaped area of hyperattenuation in the right hepatic lobe (image 60) is unchanged and likely a perfusion anomaly. Gallbladder and adrenal glands are unremarkable. Low-attenuation lesions in the kidneys measure up to 2.0 cm on the left, as before and likely represent cysts. Kidneys and spleen are otherwise unremarkable. Calcifications are seen in the pancreas. Pancreas, stomach and small bowel are otherwise grossly unremarkable. There may be rectal wall thickening. Colon is otherwise unremarkable.  Prostate is enlarged and indents the bladder base. No pathologically enlarged lymph nodes. No free fluid. Atherosclerotic calcification of the  arterial vasculature. Infrarenal aortic aneurysm measures 3.9 x 4.1 cm, stable.  A rounded sclerotic lesion in the left femoral neck has internal lucency, suggesting the possibility of healing. Vague sclerosis in the left iliac wing (image 98), unchanged. Degenerative changes are seen in the spine. Retrolisthesis is seen at L3-4 and L4-5, presumably degenerative in nature and unchanged.    IMPRESSION: 1. Apical left upper lobe mass is stable. However, there are new hepatic lesions, indicative of metastatic disease. 2. Evolving changes of radiation therapy in the medial aspects of both upper lobes. 3. Left iliac wing and left femoral neck sclerotic lesions, as before, with possible interval healing in the left femoral neck. 4. Extensive collateral venous flow in the mediastinum and paraspinal regions secondary to left brachiocephalic vein stenosis/occlusion, as before. 5. Coronary artery calcification. 6. Chronic calcific pancreatitis. 7. Infrarenal aortic aneurysm. 8. Enlarged prostate.   Electronically Signed   By: Lorin Picket M.D.   On: 10/22/2013 15:24   ASSESSMENT AND PLAN: This is a very pleasant 73 years old Asian male with metastatic non-small cell lung cancer, squamous cell carcinoma status post palliative radiotherapy to the mediastinum and currently undergoing systemic chemotherapy with carboplatin and paclitaxel status post 6 cycles with partial response.  His recent CT scan of the chest, abdomen and pelvis showed stable disease in the lung but the patient has new hepatic lesions indicative of metastatic disease. He also continues to have stable osseous metastasis. I had a lengthy discussion with the patient and his daughter today about his current disease status and treatment options. I recommended for the patient treatment with second line therapy with Nivolumab 3 mg/kg every 3 weeks.  The patient understands that this is a palliative immunotherapy treatment. I discussed with her the adverse  effect of this treatment including but not limited to an elevated colitis with diarrhea, skin rash, hepatitis, pneumonitis as well as renal dysfunction. He would like to start the treatment as soon as possible. I will arrange for him to start the first dose of this treatment next week. The patient was given handout about his new treatment with Nivolumab. For the bone metastasis I would consider the patient for treatment with Xgeva after receiving a dental clearance from his dentist. He would come back for followup visit in 3 weeks for reevaluation and management any adverse effect of his treatment. He was advised to call immediately if he has any concerning symptoms in the interval. The patient voices understanding of current disease status and treatment options and is in agreement with the current care plan.  All questions were answered. The patient knows to call the clinic with any problems, questions or concerns. We can certainly see the patient much sooner if necessary. I spent 20 minutes of face-to-face counseling with the patient and his daughter out of the total visit time 30  minutes. Disclaimer: This note was dictated with voice recognition software. Similar sounding words can inadvertently be transcribed and may not be corrected upon review.  Curt Bears, MD 10/27/2013

## 2013-10-27 NOTE — Telephone Encounter (Signed)
gv adn printed appt sched and avs forpt for April and May....Marland Kitchensed added tx.

## 2013-11-01 ENCOUNTER — Ambulatory Visit (INDEPENDENT_AMBULATORY_CARE_PROVIDER_SITE_OTHER): Payer: Medicare PPO | Admitting: Emergency Medicine

## 2013-11-01 ENCOUNTER — Ambulatory Visit: Payer: Medicare PPO

## 2013-11-01 VITALS — BP 100/50 | HR 80 | Temp 97.5°F | Resp 16 | Ht 65.5 in | Wt 118.0 lb

## 2013-11-01 DIAGNOSIS — R109 Unspecified abdominal pain: Secondary | ICD-10-CM

## 2013-11-01 DIAGNOSIS — C3490 Malignant neoplasm of unspecified part of unspecified bronchus or lung: Secondary | ICD-10-CM

## 2013-11-01 LAB — POCT CBC
Granulocyte percent: 77.2 %G (ref 37–80)
HEMATOCRIT: 29.7 % — AB (ref 43.5–53.7)
Hemoglobin: 9 g/dL — AB (ref 14.1–18.1)
Lymph, poc: 1.1 (ref 0.6–3.4)
MCH: 24.8 pg — AB (ref 27–31.2)
MCHC: 30.3 g/dL — AB (ref 31.8–35.4)
MCV: 81.7 fL (ref 80–97)
MID (cbc): 0.4 (ref 0–0.9)
MPV: 8.4 fL (ref 0–99.8)
POC GRANULOCYTE: 4.9 (ref 2–6.9)
POC LYMPH %: 16.8 % (ref 10–50)
POC MID %: 6 %M (ref 0–12)
Platelet Count, POC: 264 10*3/uL (ref 142–424)
RBC: 3.63 M/uL — AB (ref 4.69–6.13)
RDW, POC: 14.6 %
WBC: 6.3 10*3/uL (ref 4.6–10.2)

## 2013-11-01 LAB — POCT URINALYSIS DIPSTICK
Bilirubin, UA: NEGATIVE
Blood, UA: NEGATIVE
Glucose, UA: NEGATIVE
LEUKOCYTES UA: NEGATIVE
NITRITE UA: NEGATIVE
Protein, UA: 30
Spec Grav, UA: 1.025
UROBILINOGEN UA: 0.2
pH, UA: 5.5

## 2013-11-01 LAB — POCT UA - MICROSCOPIC ONLY
Bacteria, U Microscopic: NEGATIVE
CASTS, UR, LPF, POC: NEGATIVE
CRYSTALS, UR, HPF, POC: NEGATIVE
Mucus, UA: POSITIVE
RBC, urine, microscopic: NEGATIVE
WBC, UR, HPF, POC: NEGATIVE
YEAST UA: NEGATIVE

## 2013-11-01 MED ORDER — TRAMADOL HCL 50 MG PO TABS: ORAL_TABLET | ORAL | Status: DC

## 2013-11-01 NOTE — Progress Notes (Signed)
Subjective:    Patient ID: [name], male    DOB: 29-Feb-1941, 73 y.o.   MRN: [id number]  HPI 73 year old male presents for sharp right side pain. When he coughs or sneezes the pain increases. No loss of appetite. Pt "feels like the pain is spreading and is worried about his intestines." No rash or fever. Non smoker. Pt has lung cancer. He regularly sees Dr. Julien Nordmann for his lung cancer. They also found some cancer in his liver. Patient has not smoked for 5 years. Recent CT scan showed metastatic disease to the abdomen  and  liver .   Review of Systems     Objective:   Physical Exam patient has a pacemaker/defibrillator  in the left anterior chest. He  has a PICC line present in the right anterior chest. Neck is supple. Chest reveals symmetrical expansion. Heart was a regular rate. His abdomen is tender in the right midabdomen without definite masses. Patient is cach [facility] reading (PRIMARY) by  Dr.Jalaysia Lobb [facility]. No acute changes Results for orders placed in visit on 11/01/13  POCT CBC      Result Value Ref Range   WBC 6.3  4.6 - 10.2 K/uL   Lymph, poc 1.1  0.6 - 3.4   POC LYMPH PERCENT 16.8  10 - 50 %L   MID (cbc) 0.4  0 - 0.9   POC MID % 6.0  0 - 12 %M   POC Granulocyte 4.9  2 - 6.9   Granulocyte percent 77.2  37 - 80 %G   RBC 3.63 (*) 4.69 - 6.13 M/uL   Hemoglobin 9.0 (*) 14.1 - 18.1 g/dL   HCT, POC 29.7 (*) 43.5 - 53.7 %   MCV 81.7  80 - 97 fL   MCH, POC 24.8 (*) 27 - 31.2 pg   MCHC 30.3 (*) 31.8 - 35.4 g/dL   RDW, POC 14.6     Platelet Count, POC 264  142 - 424 K/uL   MPV 8.4  0 - 99.8 fL  POCT UA - MICROSCOPIC ONLY      Result Value Ref Range   WBC, Ur, HPF, POC neg     RBC, urine, microscopic neg     Bacteria, U Microscopic neg     Mucus, UA pos     Epithelial cells, urine per micros 0-1     Crystals, Ur, HPF, POC neg     Casts, Ur, LPF, POC neg     Yeast, UA neg    POCT URINALYSIS DIPSTICK      Result Value Ref Range   Color, UA yellow     Clarity, UA clear     Glucose, UA neg     Bilirubin, UA neg     Ketones, UA trace     Spec Grav, UA 1.025     Blood, UA neg     pH, UA 5.5     Protein, UA 30     Urobilinogen, UA 0.2     Nitrite, UA neg     Leukocytes, UA Negative          Assessment & Plan:  Patient has metastatic disease by CT. His white count is normal he is anemic at 9 but has been so for many months. He will followup with Dr. Julien Nordmann in 9 days. He has a treatment plan for Wednesday he was given Ultram to have  Pain. He apparently had nausea and vomiting from oxycodone so we'll do Ultram  50 mg and have him try half a tablet every 6-8 hours .

## 2013-11-02 ENCOUNTER — Telehealth: Payer: Self-pay | Admitting: Internal Medicine

## 2013-11-02 ENCOUNTER — Telehealth: Payer: Self-pay | Admitting: Medical Oncology

## 2013-11-02 ENCOUNTER — Other Ambulatory Visit: Payer: Self-pay | Admitting: Medical Oncology

## 2013-11-02 NOTE — Telephone Encounter (Signed)
Pt instructed to cancel lab and chemo tomorrow and r/s for Thursday. I told him someone will call him tomorrow with new time for lab and chemo on Thursday.He voiced understanding.

## 2013-11-02 NOTE — Telephone Encounter (Signed)
erroneous

## 2013-11-02 NOTE — Telephone Encounter (Signed)
per 4/14 pof moved 4/15 appts to 4/16. s/w pt via [place] interperters he is aware.

## 2013-11-03 ENCOUNTER — Other Ambulatory Visit: Payer: Self-pay | Admitting: *Deleted

## 2013-11-03 ENCOUNTER — Ambulatory Visit: Payer: Medicare PPO

## 2013-11-03 ENCOUNTER — Other Ambulatory Visit: Payer: Medicare PPO

## 2013-11-03 MED ORDER — ROSUVASTATIN CALCIUM 20 MG PO TABS: 20.0000 mg | ORAL_TABLET | Freq: Every day | ORAL | Status: DC

## 2013-11-04 ENCOUNTER — Ambulatory Visit (HOSPITAL_BASED_OUTPATIENT_CLINIC_OR_DEPARTMENT_OTHER): Payer: Medicare PPO

## 2013-11-04 ENCOUNTER — Other Ambulatory Visit: Payer: Self-pay | Admitting: Internal Medicine

## 2013-11-04 ENCOUNTER — Other Ambulatory Visit (HOSPITAL_BASED_OUTPATIENT_CLINIC_OR_DEPARTMENT_OTHER): Payer: Medicare PPO

## 2013-11-04 VITALS — BP 95/62 | HR 67 | Temp 97.9°F | Resp 20

## 2013-11-04 DIAGNOSIS — C7952 Secondary malignant neoplasm of bone marrow: Secondary | ICD-10-CM

## 2013-11-04 DIAGNOSIS — I10 Essential (primary) hypertension: Secondary | ICD-10-CM

## 2013-11-04 DIAGNOSIS — C3490 Malignant neoplasm of unspecified part of unspecified bronchus or lung: Secondary | ICD-10-CM

## 2013-11-04 DIAGNOSIS — Z5112 Encounter for antineoplastic immunotherapy: Secondary | ICD-10-CM

## 2013-11-04 DIAGNOSIS — C771 Secondary and unspecified malignant neoplasm of intrathoracic lymph nodes: Secondary | ICD-10-CM

## 2013-11-04 DIAGNOSIS — C787 Secondary malignant neoplasm of liver and intrahepatic bile duct: Secondary | ICD-10-CM

## 2013-11-04 DIAGNOSIS — C7951 Secondary malignant neoplasm of bone: Secondary | ICD-10-CM

## 2013-11-04 LAB — COMPREHENSIVE METABOLIC PANEL (CC13)
ALK PHOS: 203 U/L — AB (ref 40–150)
ALT: 13 U/L (ref 0–55)
AST: 33 U/L (ref 5–34)
Albumin: 3.1 g/dL — ABNORMAL LOW (ref 3.5–5.0)
Anion Gap: 8 mEq/L (ref 3–11)
BILIRUBIN TOTAL: 0.33 mg/dL (ref 0.20–1.20)
BUN: 28.6 mg/dL — AB (ref 7.0–26.0)
CO2: 26 mEq/L (ref 22–29)
Calcium: 9.3 mg/dL (ref 8.4–10.4)
Chloride: 107 mEq/L (ref 98–109)
Creatinine: 1.5 mg/dL — ABNORMAL HIGH (ref 0.7–1.3)
GLUCOSE: 88 mg/dL (ref 70–140)
Potassium: 4.3 mEq/L (ref 3.5–5.1)
SODIUM: 141 meq/L (ref 136–145)
Total Protein: 8.4 g/dL — ABNORMAL HIGH (ref 6.4–8.3)

## 2013-11-04 LAB — CBC WITH DIFFERENTIAL/PLATELET
BASO%: 0.3 % (ref 0.0–2.0)
Basophils Absolute: 0 10*3/uL (ref 0.0–0.1)
EOS%: 1.8 % (ref 0.0–7.0)
Eosinophils Absolute: 0.1 10*3/uL (ref 0.0–0.5)
HCT: 27.1 % — ABNORMAL LOW (ref 38.4–49.9)
HGB: 8.6 g/dL — ABNORMAL LOW (ref 13.0–17.1)
LYMPH#: 0.6 10*3/uL — AB (ref 0.9–3.3)
LYMPH%: 11 % — ABNORMAL LOW (ref 14.0–49.0)
MCH: 25.7 pg — AB (ref 27.2–33.4)
MCHC: 31.6 g/dL — AB (ref 32.0–36.0)
MCV: 81.4 fL (ref 79.3–98.0)
MONO#: 0.6 10*3/uL (ref 0.1–0.9)
MONO%: 10.3 % (ref 0.0–14.0)
NEUT#: 4.3 10*3/uL (ref 1.5–6.5)
NEUT%: 76.6 % — AB (ref 39.0–75.0)
Platelets: 239 10*3/uL (ref 140–400)
RBC: 3.33 10*6/uL — ABNORMAL LOW (ref 4.20–5.82)
RDW: 15.5 % — AB (ref 11.0–14.6)
WBC: 5.7 10*3/uL (ref 4.0–10.3)

## 2013-11-04 LAB — TSH CHCC: TSH: 2.267 m(IU)/L (ref 0.320–4.118)

## 2013-11-04 MED ORDER — CLONIDINE HCL 0.1 MG PO TABS
0.2000 mg | ORAL_TABLET | Freq: Once | ORAL | Status: AC
  Administered 2013-11-04: 0.2 mg via ORAL

## 2013-11-04 MED ORDER — SODIUM CHLORIDE 0.9 % IV SOLN
Freq: Once | INTRAVENOUS | Status: AC
  Administered 2013-11-04: 16:00:00 via INTRAVENOUS

## 2013-11-04 MED ORDER — SODIUM CHLORIDE 0.9 % IV SOLN
3.0000 mg/kg | Freq: Once | INTRAVENOUS | Status: AC
  Administered 2013-11-04: 160 mg via INTRAVENOUS
  Filled 2013-11-04: qty 16

## 2013-11-04 NOTE — Progress Notes (Signed)
@  1500 notified Dr. Julien Nordmann of BP elevation. Order given to give clonopin dose now and encourage him to get his BP med refilled. Patient reports he has not taken BP pill in 4 days due to being out. Says his home health nurse has put in a call to his cardiologist for refill. OK to treat today despite BP Declines to allow nurse to use his PAC today since he did not apply EMLA cream. Says he has some at home. Reminded daughter to apply cream to port for next treatment-port needs to be flushed on a scheduled basis to maintain patency.  Discussed antiemetic with patient and through interpreter reports he still has nausea med at home from prior treatment.

## 2013-11-04 NOTE — Patient Instructions (Signed)
[facility] Discharge Instructions for Patients Receiving Chemotherapy  Today you received the following chemotherapy (antibody): Nivolumab  To help prevent nausea and vomiting after your treatment, we encourage you to take your nausea medication if needed  Compazine 10 mg every 6 hours as needed    If you develop nausea and vomiting that is not controlled by your nausea medication, call the clinic.   BELOW ARE SYMPTOMS THAT SHOULD BE REPORTED IMMEDIATELY:  *FEVER GREATER THAN 100.5 F  *CHILLS WITH OR WITHOUT FEVER  NAUSEA AND VOMITING THAT IS NOT CONTROLLED WITH YOUR NAUSEA MEDICATION  *UNUSUAL SHORTNESS OF BREATH  *UNUSUAL BRUISING OR BLEEDING  TENDERNESS IN MOUTH AND THROAT WITH OR WITHOUT PRESENCE OF ULCERS  *URINARY PROBLEMS  *BOWEL PROBLEMS  UNUSUAL RASH Items with * indicate a potential emergency and should be followed up as soon as possible.  Please get your blood pressure medication filled as soon as possible.  Feel free to call the clinic should you have any questions or concerns. The clinic phone number is [phone].  It has been a pleasure to serve you today!

## 2013-11-05 ENCOUNTER — Telehealth: Payer: Self-pay

## 2013-11-05 NOTE — Telephone Encounter (Signed)
Message copied by Baruch Merl on Fri Nov 05, 2013  1:17 PM ------      Message from: Tania Ade      Created: Thu Nov 04, 2013  4:31 PM      Regarding: Chemo Follow Up Call       1st Nivolumab- does not speak [place]      Call daughter, Lia Foyer at [phone] ------

## 2013-11-05 NOTE — Telephone Encounter (Signed)
Tried to reach daughter at patients home number noted below, her home number listed as well as all the other contact numbers listed for patient.   No one  Answered  phone call attempts.  None of the phones had a voice mail set up.

## 2013-11-10 ENCOUNTER — Telehealth: Payer: Self-pay | Admitting: *Deleted

## 2013-11-10 NOTE — Telephone Encounter (Signed)
Received request for authorization for Delton See from [facility].  Gave form to [place], care management.

## 2013-11-17 ENCOUNTER — Other Ambulatory Visit (HOSPITAL_BASED_OUTPATIENT_CLINIC_OR_DEPARTMENT_OTHER): Payer: Medicare PPO

## 2013-11-17 ENCOUNTER — Telehealth: Payer: Self-pay | Admitting: Internal Medicine

## 2013-11-17 ENCOUNTER — Ambulatory Visit (HOSPITAL_BASED_OUTPATIENT_CLINIC_OR_DEPARTMENT_OTHER): Payer: Medicare PPO

## 2013-11-17 ENCOUNTER — Ambulatory Visit (HOSPITAL_BASED_OUTPATIENT_CLINIC_OR_DEPARTMENT_OTHER): Payer: Medicare PPO | Admitting: Internal Medicine

## 2013-11-17 ENCOUNTER — Encounter: Payer: Self-pay | Admitting: Internal Medicine

## 2013-11-17 VITALS — BP 143/81 | HR 77 | Temp 97.7°F | Resp 18 | Ht 65.5 in | Wt 116.4 lb

## 2013-11-17 DIAGNOSIS — C7952 Secondary malignant neoplasm of bone marrow: Secondary | ICD-10-CM

## 2013-11-17 DIAGNOSIS — R5383 Other fatigue: Secondary | ICD-10-CM

## 2013-11-17 DIAGNOSIS — C7951 Secondary malignant neoplasm of bone: Secondary | ICD-10-CM

## 2013-11-17 DIAGNOSIS — C3490 Malignant neoplasm of unspecified part of unspecified bronchus or lung: Secondary | ICD-10-CM

## 2013-11-17 DIAGNOSIS — C787 Secondary malignant neoplasm of liver and intrahepatic bile duct: Secondary | ICD-10-CM

## 2013-11-17 DIAGNOSIS — D649 Anemia, unspecified: Secondary | ICD-10-CM

## 2013-11-17 DIAGNOSIS — C771 Secondary and unspecified malignant neoplasm of intrathoracic lymph nodes: Secondary | ICD-10-CM

## 2013-11-17 DIAGNOSIS — R5381 Other malaise: Secondary | ICD-10-CM

## 2013-11-17 DIAGNOSIS — Z5111 Encounter for antineoplastic chemotherapy: Secondary | ICD-10-CM

## 2013-11-17 LAB — CBC WITH DIFFERENTIAL/PLATELET
BASO%: 0.2 % (ref 0.0–2.0)
BASOS ABS: 0 10*3/uL (ref 0.0–0.1)
EOS ABS: 0.1 10*3/uL (ref 0.0–0.5)
EOS%: 1 % (ref 0.0–7.0)
HEMATOCRIT: 26.2 % — AB (ref 38.4–49.9)
HGB: 8.3 g/dL — ABNORMAL LOW (ref 13.0–17.1)
LYMPH%: 11.3 % — ABNORMAL LOW (ref 14.0–49.0)
MCH: 25.4 pg — ABNORMAL LOW (ref 27.2–33.4)
MCHC: 31.7 g/dL — ABNORMAL LOW (ref 32.0–36.0)
MCV: 80 fL (ref 79.3–98.0)
MONO#: 0.6 10*3/uL (ref 0.1–0.9)
MONO%: 10.1 % (ref 0.0–14.0)
NEUT%: 77.4 % — AB (ref 39.0–75.0)
NEUTROS ABS: 4.8 10*3/uL (ref 1.5–6.5)
PLATELETS: 227 10*3/uL (ref 140–400)
RBC: 3.27 10*6/uL — AB (ref 4.20–5.82)
RDW: 15.8 % — ABNORMAL HIGH (ref 11.0–14.6)
WBC: 6.2 10*3/uL (ref 4.0–10.3)
lymph#: 0.7 10*3/uL — ABNORMAL LOW (ref 0.9–3.3)

## 2013-11-17 LAB — COMPREHENSIVE METABOLIC PANEL (CC13)
ALT: 16 U/L (ref 0–55)
ANION GAP: 8 meq/L (ref 3–11)
AST: 39 U/L — ABNORMAL HIGH (ref 5–34)
Albumin: 2.9 g/dL — ABNORMAL LOW (ref 3.5–5.0)
Alkaline Phosphatase: 193 U/L — ABNORMAL HIGH (ref 40–150)
BILIRUBIN TOTAL: 0.37 mg/dL (ref 0.20–1.20)
BUN: 26.1 mg/dL — AB (ref 7.0–26.0)
CALCIUM: 9.1 mg/dL (ref 8.4–10.4)
CHLORIDE: 108 meq/L (ref 98–109)
CO2: 25 mEq/L (ref 22–29)
Creatinine: 1.4 mg/dL — ABNORMAL HIGH (ref 0.7–1.3)
GLUCOSE: 81 mg/dL (ref 70–140)
Potassium: 4.2 mEq/L (ref 3.5–5.1)
Sodium: 141 mEq/L (ref 136–145)
Total Protein: 7.9 g/dL (ref 6.4–8.3)

## 2013-11-17 LAB — TSH CHCC: TSH: 1.6 m(IU)/L (ref 0.320–4.118)

## 2013-11-17 MED ORDER — SODIUM CHLORIDE 0.9 % IV SOLN
3.0000 mg/kg | Freq: Once | INTRAVENOUS | Status: AC
  Administered 2013-11-17: 160 mg via INTRAVENOUS
  Filled 2013-11-17: qty 16

## 2013-11-17 MED ORDER — HEPARIN SOD (PORK) LOCK FLUSH 100 UNIT/ML IV SOLN
500.0000 [IU] | Freq: Once | INTRAVENOUS | Status: AC | PRN
  Administered 2013-11-17: 500 [IU]
  Filled 2013-11-17: qty 5

## 2013-11-17 MED ORDER — SODIUM CHLORIDE 0.9 % IJ SOLN
10.0000 mL | INTRAMUSCULAR | Status: DC | PRN
  Administered 2013-11-17: 10 mL
  Filled 2013-11-17: qty 10

## 2013-11-17 MED ORDER — SODIUM CHLORIDE 0.9 % IV SOLN
Freq: Once | INTRAVENOUS | Status: AC
  Administered 2013-11-17: 12:00:00 via INTRAVENOUS

## 2013-11-17 NOTE — Patient Instructions (Signed)
[facility] Discharge Instructions for Patients Receiving Chemotherapy  Today you received the following chemotherapy agents: Nivolumab  To help prevent nausea and vomiting after your treatment, we encourage you to take your nausea medication as prescribed by your physician.    If you develop nausea and vomiting that is not controlled by your nausea medication, call the clinic.   BELOW ARE SYMPTOMS THAT SHOULD BE REPORTED IMMEDIATELY:  *FEVER GREATER THAN 100.5 F  *CHILLS WITH OR WITHOUT FEVER  NAUSEA AND VOMITING THAT IS NOT CONTROLLED WITH YOUR NAUSEA MEDICATION  *UNUSUAL SHORTNESS OF BREATH  *UNUSUAL BRUISING OR BLEEDING  TENDERNESS IN MOUTH AND THROAT WITH OR WITHOUT PRESENCE OF ULCERS  *URINARY PROBLEMS  *BOWEL PROBLEMS  UNUSUAL RASH Items with * indicate a potential emergency and should be followed up as soon as possible.  Feel free to call the clinic you have any questions or concerns. The clinic phone number is [phone].

## 2013-11-17 NOTE — Progress Notes (Signed)
[facility] [phone]   [phone]  OFFICE VISIT PROGRESS NOTE  Lorretta Harp, MD [address] [place] [address]  DIAGNOSIS: Metastatic non-small cell lung cancer, squamous cell carcinoma with a large anterior mediastinal mass as well as bilateral lymphadenopathy and liver as well as bone metastases diagnosed in June of 2014   PRIOR THERAPY:  1) Palliative radiotherapy to the manubrium under the care of Dr. Tammi Klippel.  2) Systemic chemotherapy with carboplatin for AUC of 5 and paclitaxel 175 mg/M2 every 3 weeks with Neulasta support. Status post 6 cycles   CURRENT THERAPY: Immunotherapy with Nivolumab 3 mg/kilogram every 2 weeks status post 1 cycle.  CHEMOTHERAPY INTENT: Palliative  CURRENT # OF CHEMOTHERAPY CYCLES: 2 CURRENT ANTIEMETICS: Zofran, dexamethasone and Compazine  CURRENT SMOKING STATUS: Former smoker quit 03/13/2009  ORAL CHEMOTHERAPY AND CONSENT: None  CURRENT BISPHOSPHONATES USE: None  PAIN MANAGEMENT: 0/10  NARCOTICS INDUCED CONSTIPATION: None  LIVING WILL AND CODE STATUS: Full code.   INTERVAL HISTORY: [name] 73 y.o. male returns to the clinic today for follow up visit accompanied by his interpreter. The patient is feeling very well today with no specific complaints except for mild fatigue. He denied having any significant weight loss or night sweats. He denied having any significant nausea or vomiting, diarrhea or constipation. The patient denied having any fever or chills. He has no shortness of breath except with exertion, or hemoptysis. He has occasional pain at the right lower rib cage suspicious after cough and sneezing. He tolerated the first cycle of his immunotherapy fairly well with no significant adverse effects. He is here today to start cycle #2.  MEDICAL HISTORY: Past Medical History  Diagnosis Date  . Hypertension   . Ischemic cardiomyopathy Sept 2011    EF improved to 35-40% 2D  .  COPD (chronic obstructive pulmonary disease)   . LBBB (left bundle branch block)   . Acute anterior myocardial infarction 2011    s/p PCI to LAD; 2.25 x 12 mm BMS  . ICD (implantable cardiac defibrillator) in place   . Anginal pain   . Sleep apnea     "while sleeping last week" (11/12/2012)  . Stroke 2009    "sometimes left side goes numb/weak/gives out" (11/12/2012)  . Pacemaker   . Shortness of breath     Hx: of with exertion   . Headache(784.0)   . Cancer     Lung cancer    ALLERGIES:  is allergic to oxycodone and tramadol.  MEDICATIONS:  Current Outpatient Prescriptions  Medication Sig Dispense Refill  . carvedilol (COREG) 6.25 MG tablet Take 0.5 tablets (3.125 mg total) by mouth 2 (two) times daily with a meal.  60 tablet  3  . isosorbide dinitrate (ISORDIL) 10 MG tablet take 1 tablet by mouth every 8 hours  90 tablet  4  . ramipril (ALTACE) 2.5 MG capsule Take 2.5 mg by mouth daily at 12 noon.      . rosuvastatin (CRESTOR) 20 MG tablet Take 1 tablet (20 mg total) by mouth daily at 12 noon.  30 tablet  0   No current facility-administered medications for this visit.    SURGICAL HISTORY:  Past Surgical History  Procedure Laterality Date  . Bi-ventricular implantable cardioverter defibrillator  (crt-d)  11/12/2012  . Coronary angioplasty with stent placement  02/12/2010    LAD BMS  . Insert / replace / remove pacemaker    . Portacath placement Right  02/24/2013    Procedure: INSERTION PORT-A-CATH;  Surgeon: Gaye Pollack, MD;  Location: [facility] OR;  Service: Thoracic;  Laterality: Right;    REVIEW OF SYSTEMS:  Constitutional: positive for fatigue Eyes: negative Ears, nose, mouth, throat, and face: negative Respiratory: positive for cough, dyspnea on exertion and sputum Cardiovascular: negative Gastrointestinal: negative Genitourinary:negative Integument/breast: negative Hematologic/lymphatic: negative Musculoskeletal:negative Neurological: negative Behavioral/Psych:  negative Endocrine: negative Allergic/Immunologic: negative   PHYSICAL EXAMINATION: General appearance: alert, cooperative, fatigued and no distress Head: Normocephalic, without obvious abnormality, atraumatic Neck: no adenopathy, no JVD, supple, symmetrical, trachea midline and thyroid not enlarged, symmetric, no tenderness/mass/nodules Lymph nodes: Cervical, supraclavicular, and axillary nodes normal. Resp: clear to auscultation bilaterally and normal percussion bilaterally Back: symmetric, no curvature. ROM normal. No CVA tenderness. Cardio: regular rate and rhythm, S1, S2 normal, no murmur, click, rub or gallop GI: soft, non-tender; bowel sounds normal; no masses,  no organomegaly Extremities: extremities normal, atraumatic, no cyanosis or edema Neurologic: Alert and oriented X 3, normal strength and tone. Normal symmetric reflexes. Normal coordination and gait  ECOG PERFORMANCE STATUS: 1 - Symptomatic but completely ambulatory  Blood pressure 143/81, pulse 77, temperature 97.7 F (36.5 C), temperature source Oral, resp. rate 18, height 5' 5.5" (1.664 m), weight 116 lb 6.4 oz (52.799 kg).  LABORATORY DATA: Lab Results  Component Value Date   WBC 6.2 11/17/2013   HGB 8.3* 11/17/2013   HCT 26.2* 11/17/2013   MCV 80.0 11/17/2013   PLT 227 11/17/2013      Chemistry      Component Value Date/Time   NA 141 11/17/2013 1029   NA 136 04/01/2013 0150   K 4.2 11/17/2013 1029   K 4.1 04/01/2013 0150   CL 100 04/01/2013 0150   CO2 25 11/17/2013 1029   CO2 28 04/01/2013 0150   BUN 26.1* 11/17/2013 1029   BUN 48* 04/01/2013 0150   CREATININE 1.4* 11/17/2013 1029   CREATININE 2.13* 04/01/2013 0150      Component Value Date/Time   CALCIUM 9.1 11/17/2013 1029   CALCIUM 8.6 04/01/2013 0150   ALKPHOS 193* 11/17/2013 1029   ALKPHOS 91 02/24/2013 0716   AST 39* 11/17/2013 1029   AST 27 02/24/2013 0716   ALT 16 11/17/2013 1029   ALT 16 02/24/2013 0716   BILITOT 0.37 11/17/2013 1029   BILITOT 0.4 02/24/2013  0716       RADIOGRAPHIC STUDIES:   ASSESSMENT AND PLAN: This is a very pleasant 73 years old Asian male with metastatic non-small cell lung cancer, squamous cell carcinoma status post palliative radiotherapy to the mediastinum and currently undergoing systemic chemotherapy with carboplatin and paclitaxel status post 6 cycles with partial response.  He is currently undergoing immunotherapy with Nivolumab 3 mg/kilogram every 2 weeks status post 1 cycle and tolerated the first cycle of his treatment fairly well with no significant adverse effects. I recommended for the patient to proceed with cycle #2 today as scheduled. He would come back for followup visit in 2 weeks with the next cycle of his treatment. For anemia, I advised the patient to start over the counter iron tablets twice a day. He was advised to call immediately if he has any concerning symptoms in the interval. The patient voices understanding of current disease status and treatment options and is in agreement with the current care plan.  All questions were answered. The patient knows to call the clinic with any problems, questions or concerns. We can certainly see the patient much sooner if necessary.  Disclaimer: This note was dictated with voice recognition software. Similar sounding words can inadvertently be transcribed and may not be corrected upon review.  Curt Bears, MD 11/17/2013

## 2013-11-17 NOTE — Telephone Encounter (Signed)
gv adn rpinted appts ched and avs for pt for May.Marland KitchenMarland Kitchen

## 2013-11-17 NOTE — Progress Notes (Signed)
Per Dr. Julien Nordmann, okay to tx with today's CBC and CMET. Patient tolerated infusion well.

## 2013-11-18 ENCOUNTER — Telehealth (HOSPITAL_COMMUNITY): Payer: Self-pay | Admitting: *Deleted

## 2013-11-18 ENCOUNTER — Other Ambulatory Visit: Payer: Self-pay | Admitting: *Deleted

## 2013-11-18 ENCOUNTER — Other Ambulatory Visit (HOSPITAL_COMMUNITY): Payer: Self-pay | Admitting: Cardiovascular Disease

## 2013-11-18 MED ORDER — CARVEDILOL 6.25 MG PO TABS: 3.1250 mg | ORAL_TABLET | Freq: Two times a day (BID) | ORAL | Status: DC

## 2013-11-18 MED ORDER — RAMIPRIL 2.5 MG PO CAPS: 2.5000 mg | ORAL_CAPSULE | Freq: Every day | ORAL | Status: DC

## 2013-11-18 NOTE — Telephone Encounter (Signed)
What medication?  I called patient back, but voice mail hasn't been set up.

## 2013-11-18 NOTE — Telephone Encounter (Signed)
I called [facility] and refilled the coreg and ramipril

## 2013-11-18 NOTE — Telephone Encounter (Signed)
Pts daughter would like to know why his medication refill was not approved.  Please advise

## 2013-11-18 NOTE — Telephone Encounter (Signed)
RX sent for refills for ramipril and coreg

## 2013-12-01 ENCOUNTER — Ambulatory Visit (HOSPITAL_BASED_OUTPATIENT_CLINIC_OR_DEPARTMENT_OTHER): Payer: Medicare PPO | Admitting: Physician Assistant

## 2013-12-01 ENCOUNTER — Other Ambulatory Visit: Payer: Self-pay | Admitting: Medical Oncology

## 2013-12-01 ENCOUNTER — Ambulatory Visit (HOSPITAL_BASED_OUTPATIENT_CLINIC_OR_DEPARTMENT_OTHER): Payer: Medicare PPO

## 2013-12-01 ENCOUNTER — Other Ambulatory Visit: Payer: Medicare PPO

## 2013-12-01 ENCOUNTER — Telehealth: Payer: Self-pay | Admitting: Internal Medicine

## 2013-12-01 ENCOUNTER — Encounter: Payer: Self-pay | Admitting: *Deleted

## 2013-12-01 ENCOUNTER — Other Ambulatory Visit (HOSPITAL_BASED_OUTPATIENT_CLINIC_OR_DEPARTMENT_OTHER): Payer: Medicare PPO

## 2013-12-01 ENCOUNTER — Encounter: Payer: Self-pay | Admitting: Physician Assistant

## 2013-12-01 ENCOUNTER — Ambulatory Visit: Payer: Medicare PPO

## 2013-12-01 VITALS — BP 147/66 | HR 71 | Temp 98.0°F | Resp 18 | Ht 65.5 in | Wt 114.2 lb

## 2013-12-01 DIAGNOSIS — C7952 Secondary malignant neoplasm of bone marrow: Secondary | ICD-10-CM

## 2013-12-01 DIAGNOSIS — C3490 Malignant neoplasm of unspecified part of unspecified bronchus or lung: Secondary | ICD-10-CM

## 2013-12-01 DIAGNOSIS — D649 Anemia, unspecified: Secondary | ICD-10-CM

## 2013-12-01 DIAGNOSIS — C7951 Secondary malignant neoplasm of bone: Secondary | ICD-10-CM

## 2013-12-01 DIAGNOSIS — C787 Secondary malignant neoplasm of liver and intrahepatic bile duct: Secondary | ICD-10-CM

## 2013-12-01 DIAGNOSIS — Z5112 Encounter for antineoplastic immunotherapy: Secondary | ICD-10-CM

## 2013-12-01 LAB — CBC WITH DIFFERENTIAL/PLATELET
BASO%: 0.2 % (ref 0.0–2.0)
BASOS ABS: 0 10*3/uL (ref 0.0–0.1)
EOS%: 0.5 % (ref 0.0–7.0)
Eosinophils Absolute: 0 10*3/uL (ref 0.0–0.5)
HEMATOCRIT: 27.7 % — AB (ref 38.4–49.9)
HEMOGLOBIN: 8.6 g/dL — AB (ref 13.0–17.1)
LYMPH#: 1 10*3/uL (ref 0.9–3.3)
LYMPH%: 13.1 % — ABNORMAL LOW (ref 14.0–49.0)
MCH: 24.6 pg — AB (ref 27.2–33.4)
MCHC: 31.2 g/dL — ABNORMAL LOW (ref 32.0–36.0)
MCV: 78.9 fL — ABNORMAL LOW (ref 79.3–98.0)
MONO#: 0.8 10*3/uL (ref 0.1–0.9)
MONO%: 10 % (ref 0.0–14.0)
NEUT%: 76.2 % — AB (ref 39.0–75.0)
NEUTROS ABS: 5.8 10*3/uL (ref 1.5–6.5)
Platelets: 236 10*3/uL (ref 140–400)
RBC: 3.51 10*6/uL — ABNORMAL LOW (ref 4.20–5.82)
RDW: 16.1 % — AB (ref 11.0–14.6)
WBC: 7.7 10*3/uL (ref 4.0–10.3)

## 2013-12-01 LAB — COMPREHENSIVE METABOLIC PANEL (CC13)
ALT: 29 U/L (ref 0–55)
AST: 59 U/L — AB (ref 5–34)
Albumin: 2.8 g/dL — ABNORMAL LOW (ref 3.5–5.0)
Alkaline Phosphatase: 230 U/L — ABNORMAL HIGH (ref 40–150)
Anion Gap: 9 mEq/L (ref 3–11)
BUN: 31.2 mg/dL — ABNORMAL HIGH (ref 7.0–26.0)
CO2: 22 mEq/L (ref 22–29)
Calcium: 9.1 mg/dL (ref 8.4–10.4)
Chloride: 106 mEq/L (ref 98–109)
Creatinine: 1.6 mg/dL — ABNORMAL HIGH (ref 0.7–1.3)
GLUCOSE: 170 mg/dL — AB (ref 70–140)
POTASSIUM: 3.8 meq/L (ref 3.5–5.1)
SODIUM: 137 meq/L (ref 136–145)
TOTAL PROTEIN: 7.7 g/dL (ref 6.4–8.3)
Total Bilirubin: 0.26 mg/dL (ref 0.20–1.20)

## 2013-12-01 LAB — TSH CHCC: TSH: 1.575 m(IU)/L (ref 0.320–4.118)

## 2013-12-01 MED ORDER — SODIUM CHLORIDE 0.9 % IV SOLN
3.0000 mg/kg | Freq: Once | INTRAVENOUS | Status: AC
  Administered 2013-12-01: 160 mg via INTRAVENOUS
  Filled 2013-12-01: qty 16

## 2013-12-01 MED ORDER — SODIUM CHLORIDE 0.9 % IJ SOLN
10.0000 mL | INTRAMUSCULAR | Status: DC | PRN
  Administered 2013-12-01: 10 mL
  Filled 2013-12-01: qty 10

## 2013-12-01 MED ORDER — SODIUM CHLORIDE 0.9 % IV SOLN
Freq: Once | INTRAVENOUS | Status: AC
  Administered 2013-12-01: 14:00:00 via INTRAVENOUS

## 2013-12-01 MED ORDER — LIDOCAINE-PRILOCAINE 2.5-2.5 % EX CREA: TOPICAL_CREAM | CUTANEOUS | Status: DC | PRN

## 2013-12-01 MED ORDER — HEPARIN SOD (PORK) LOCK FLUSH 100 UNIT/ML IV SOLN
500.0000 [IU] | Freq: Once | INTRAVENOUS | Status: AC | PRN
  Administered 2013-12-01: 500 [IU]
  Filled 2013-12-01: qty 5

## 2013-12-01 NOTE — Progress Notes (Addendum)
[place] [phone]   Fax:(336) [phone]  SHARED VISIT PROGRESS NOTE  Lorretta Harp, MD [address] [place] [address]  DIAGNOSIS: Metastatic non-small cell lung cancer, squamous cell carcinoma with a large anterior mediastinal mass as well as bilateral lymphadenopathy and liver as well as bone metastases diagnosed in June of 2014   PRIOR THERAPY:  1) Palliative radiotherapy to the manubrium under the care of Dr. Tammi Klippel.  2) Systemic chemotherapy with carboplatin for AUC of 5 and paclitaxel 175 mg/M2 every 3 weeks with Neulasta support. Status post 6 cycles   CURRENT THERAPY: Immunotherapy with Nivolumab 3 mg/kilogram every 2 weeks status post 2 cycle.  CHEMOTHERAPY INTENT: Palliative  CURRENT # OF CHEMOTHERAPY CYCLES: 3 CURRENT ANTIEMETICS: Zofran, dexamethasone and Compazine  CURRENT SMOKING STATUS: Former smoker quit 03/13/2009  ORAL CHEMOTHERAPY AND CONSENT: None  CURRENT BISPHOSPHONATES USE: None  PAIN MANAGEMENT: 0/10  NARCOTICS INDUCED CONSTIPATION: None  LIVING WILL AND CODE STATUS: Full code.   INTERVAL HISTORY: [name] 73 y.o. male returns to the clinic today for follow up visit accompanied by his interpreter. The patient is feeling very well today with no specific complaints except for continued fatigue. He denied having any significant weight loss or night sweats. He denied having any significant nausea or vomiting, diarrhea or constipation. The patient denied having any fever or chills. He has no shortness of breath except with exertion, or hemoptysis. He has occasional pain at the left lower rib cage when taking a deep breath. He is tolerating  his immunotherapy fairly well with no significant adverse effects. He denied any issues with skin rash, diarrhea or change in his baseline shortness of breath. He is here today to start cycle #3.  MEDICAL HISTORY: Past Medical History  Diagnosis Date  . Hypertension    . Ischemic cardiomyopathy Sept 2011    EF improved to 35-40% 2D  . COPD (chronic obstructive pulmonary disease)   . LBBB (left bundle branch block)   . Acute anterior myocardial infarction 2011    s/p PCI to LAD; 2.25 x 12 mm BMS  . ICD (implantable cardiac defibrillator) in place   . Anginal pain   . Sleep apnea     "while sleeping last week" (11/12/2012)  . Stroke 2009    "sometimes left side goes numb/weak/gives out" (11/12/2012)  . Pacemaker   . Shortness of breath     Hx: of with exertion   . Headache(784.0)   . Cancer     Lung cancer    ALLERGIES:  is allergic to oxycodone and tramadol.  MEDICATIONS:  Current Outpatient Prescriptions  Medication Sig Dispense Refill  . carvedilol (COREG) 6.25 MG tablet Take 0.5 tablets (3.125 mg total) by mouth 2 (two) times daily with a meal.  60 tablet  4  . isosorbide dinitrate (ISORDIL) 10 MG tablet take 1 tablet by mouth every 8 hours  90 tablet  4  . ramipril (ALTACE) 2.5 MG capsule Take 1 capsule (2.5 mg total) by mouth daily at 12 noon.  30 capsule  4  . rosuvastatin (CRESTOR) 20 MG tablet Take 1 tablet (20 mg total) by mouth daily at 12 noon.  30 tablet  0  . lidocaine-prilocaine (EMLA) cream Apply topically as needed.  30 g  0   No current facility-administered medications for this visit.   Facility-Administered Medications Ordered in Other Visits  Medication Dose Route Frequency Provider Last Rate Last Dose  .  sodium chloride 0.9 % injection 10 mL  10 mL Intracatheter PRN Curt Bears, MD   10 mL at 12/01/13 1527    SURGICAL HISTORY:  Past Surgical History  Procedure Laterality Date  . Bi-ventricular implantable cardioverter defibrillator  (crt-d)  11/12/2012  . Coronary angioplasty with stent placement  02/12/2010    LAD BMS  . Insert / replace / remove pacemaker    . Portacath placement Right 02/24/2013    Procedure: INSERTION PORT-A-CATH;  Surgeon: Gaye Pollack, MD;  Location: [facility] OR;  Service: Thoracic;  Laterality:  Right;    REVIEW OF SYSTEMS:  Constitutional: positive for fatigue Eyes: negative Ears, nose, mouth, throat, and face: negative Respiratory: positive for cough, dyspnea on exertion and sputum Cardiovascular: negative Gastrointestinal: negative Genitourinary:negative Integument/breast: negative Hematologic/lymphatic: negative Musculoskeletal:negative Neurological: negative Behavioral/Psych: negative Endocrine: negative Allergic/Immunologic: negative   PHYSICAL EXAMINATION: General appearance: alert, cooperative, fatigued and no distress Head: Normocephalic, without obvious abnormality, atraumatic Neck: no adenopathy, no JVD, supple, symmetrical, trachea midline and thyroid not enlarged, symmetric, no tenderness/mass/nodules Lymph nodes: Cervical, supraclavicular, and axillary nodes normal. Resp: clear to auscultation bilaterally and normal percussion bilaterally Back: symmetric, no curvature. ROM normal. No CVA tenderness. Cardio: regular rate and rhythm, S1, S2 normal, no murmur, click, rub or gallop GI: soft, non-tender; bowel sounds normal; no masses,  no organomegaly Extremities: extremities normal, atraumatic, no cyanosis or edema Neurologic: Alert and oriented X 3, normal strength and tone. Normal symmetric reflexes. Normal coordination and gait  ECOG PERFORMANCE STATUS: 1 - Symptomatic but completely ambulatory  Blood pressure 147/66, pulse 71, temperature 98 F (36.7 C), temperature source Oral, resp. rate 18, height 5' 5.5" (1.664 m), weight 114 lb 3.2 oz (51.801 kg).  LABORATORY DATA: Lab Results  Component Value Date   WBC 7.7 12/01/2013   HGB 8.6* 12/01/2013   HCT 27.7* 12/01/2013   MCV 78.9* 12/01/2013   PLT 236 12/01/2013      Chemistry      Component Value Date/Time   NA 137 12/01/2013 1311   NA 136 04/01/2013 0150   K 3.8 12/01/2013 1311   K 4.1 04/01/2013 0150   CL 100 04/01/2013 0150   CO2 22 12/01/2013 1311   CO2 28 04/01/2013 0150   BUN 31.2* 12/01/2013  1311   BUN 48* 04/01/2013 0150   CREATININE 1.6* 12/01/2013 1311   CREATININE 2.13* 04/01/2013 0150      Component Value Date/Time   CALCIUM 9.1 12/01/2013 1311   CALCIUM 8.6 04/01/2013 0150   ALKPHOS 230* 12/01/2013 1311   ALKPHOS 91 02/24/2013 0716   AST 59* 12/01/2013 1311   AST 27 02/24/2013 0716   ALT 29 12/01/2013 1311   ALT 16 02/24/2013 0716   BILITOT 0.26 12/01/2013 1311   BILITOT 0.4 02/24/2013 0716       RADIOGRAPHIC STUDIES:   ASSESSMENT AND PLAN: This is a very pleasant 73 years old Asian male with metastatic non-small cell lung cancer, squamous cell carcinoma status post palliative radiotherapy to the mediastinum and currently undergoing systemic chemotherapy with carboplatin and paclitaxel status post 6 cycles with partial response.  He is currently undergoing immunotherapy with Nivolumab 3 mg/kilogram every 2 weeks status post 2 cycles. Overall is tolerating this treatment relatively well with no significant adverse effects. Patient was discussed with them also seen by Dr. Julien Nordmann. He'll proceed with cycle #3 today as scheduled. He'll return in 2 weeks prior to cycle #4 with repeat labs consisting of a CBC differential, C. met and TSH.  For anemia,he is to continue taking over the counter iron tablets twice a day. He was advised to call immediately if he has any concerning symptoms in the interval. The patient voices understanding of current disease status and treatment options and is in agreement with the current care plan.  All questions were answered. The patient knows to call the clinic with any problems, questions or concerns. We can certainly see the patient much sooner if necessary.  Disclaimer: This note was dictated with voice recognition software. Similar sounding words can inadvertently be transcribed and may not be corrected upon review.  Carlton Adam, PA-C 12/01/2013  ADDENDUM: Hematology/Oncology Attending: I had a face to face encounter with the patient. I  recommended his care plan. This is a very pleasant 73 years old Asian male with metastatic non-small cell lung cancer, squamous cell carcinoma status post systemic chemotherapy with carboplatin and paclitaxel and he is now undergoing immunotherapy with Nivolumab status post 2 cycles. He tolerated the second cycle of his treatment well with no significant adverse effects. I recommended for him to proceed with cycle #3 today as scheduled. He would come back for followup visit in 2 weeks for evaluation before the next cycle of his chemotherapy. He was advised to call immediately if he has any concerning symptoms in the interval.  Disclaimer: This note was dictated with voice recognition software. Similar sounding words can inadvertently be transcribed and may not be corrected upon review. Curt Bears, MD 12/04/2013

## 2013-12-01 NOTE — Telephone Encounter (Signed)
EMLA cream order refilled per Dr Julien Nordmann.

## 2013-12-01 NOTE — Telephone Encounter (Signed)
gv a printed appt sched and avs for pt for May and June....sed added tx.

## 2013-12-01 NOTE — Patient Instructions (Signed)
Follow up in 2 weeks prior to your next scheduled cycle of immunotherapy 

## 2013-12-07 ENCOUNTER — Other Ambulatory Visit: Payer: Self-pay | Admitting: *Deleted

## 2013-12-07 DIAGNOSIS — C3490 Malignant neoplasm of unspecified part of unspecified bronchus or lung: Secondary | ICD-10-CM

## 2013-12-07 MED ORDER — ENSURE COMPLETE PO LIQD: 237.0000 mL | Freq: Three times a day (TID) | ORAL | Status: DC

## 2013-12-07 NOTE — Telephone Encounter (Signed)
[name], [employer] Case manager called wanting to see if pt could have some ensure called in to his pharmacy due to his weight loss.  It will be covered by his insurance.  Rx called in, she will inform patient.  SLJ

## 2013-12-12 ENCOUNTER — Other Ambulatory Visit (HOSPITAL_COMMUNITY): Payer: Self-pay | Admitting: Cardiovascular Disease

## 2013-12-12 NOTE — Telephone Encounter (Signed)
Rx was sent to pharmacy electronically. 

## 2013-12-15 ENCOUNTER — Telehealth: Payer: Self-pay | Admitting: Internal Medicine

## 2013-12-15 ENCOUNTER — Ambulatory Visit (HOSPITAL_BASED_OUTPATIENT_CLINIC_OR_DEPARTMENT_OTHER): Payer: Medicare PPO

## 2013-12-15 ENCOUNTER — Ambulatory Visit: Payer: Medicare PPO

## 2013-12-15 ENCOUNTER — Other Ambulatory Visit (HOSPITAL_BASED_OUTPATIENT_CLINIC_OR_DEPARTMENT_OTHER): Payer: Medicare PPO

## 2013-12-15 ENCOUNTER — Other Ambulatory Visit: Payer: Self-pay | Admitting: Medical Oncology

## 2013-12-15 ENCOUNTER — Ambulatory Visit (HOSPITAL_BASED_OUTPATIENT_CLINIC_OR_DEPARTMENT_OTHER): Payer: Medicare PPO | Admitting: Nurse Practitioner

## 2013-12-15 VITALS — BP 124/61 | HR 74 | Temp 98.1°F | Resp 18 | Ht 65.0 in | Wt 110.5 lb

## 2013-12-15 DIAGNOSIS — I5041 Acute combined systolic (congestive) and diastolic (congestive) heart failure: Secondary | ICD-10-CM

## 2013-12-15 DIAGNOSIS — C7952 Secondary malignant neoplasm of bone marrow: Secondary | ICD-10-CM

## 2013-12-15 DIAGNOSIS — I959 Hypotension, unspecified: Secondary | ICD-10-CM

## 2013-12-15 DIAGNOSIS — Z5111 Encounter for antineoplastic chemotherapy: Secondary | ICD-10-CM

## 2013-12-15 DIAGNOSIS — Z5189 Encounter for other specified aftercare: Secondary | ICD-10-CM

## 2013-12-15 DIAGNOSIS — C7951 Secondary malignant neoplasm of bone: Secondary | ICD-10-CM

## 2013-12-15 DIAGNOSIS — C771 Secondary and unspecified malignant neoplasm of intrathoracic lymph nodes: Secondary | ICD-10-CM

## 2013-12-15 DIAGNOSIS — C3490 Malignant neoplasm of unspecified part of unspecified bronchus or lung: Secondary | ICD-10-CM

## 2013-12-15 DIAGNOSIS — R5381 Other malaise: Secondary | ICD-10-CM

## 2013-12-15 DIAGNOSIS — R079 Chest pain, unspecified: Secondary | ICD-10-CM

## 2013-12-15 DIAGNOSIS — R0602 Shortness of breath: Secondary | ICD-10-CM

## 2013-12-15 DIAGNOSIS — R634 Abnormal weight loss: Secondary | ICD-10-CM

## 2013-12-15 DIAGNOSIS — Z95810 Presence of automatic (implantable) cardiac defibrillator: Secondary | ICD-10-CM

## 2013-12-15 DIAGNOSIS — C787 Secondary malignant neoplasm of liver and intrahepatic bile duct: Secondary | ICD-10-CM

## 2013-12-15 DIAGNOSIS — Z5112 Encounter for antineoplastic immunotherapy: Secondary | ICD-10-CM

## 2013-12-15 DIAGNOSIS — J40 Bronchitis, not specified as acute or chronic: Secondary | ICD-10-CM

## 2013-12-15 DIAGNOSIS — C3430 Malignant neoplasm of lower lobe, unspecified bronchus or lung: Secondary | ICD-10-CM

## 2013-12-15 DIAGNOSIS — D649 Anemia, unspecified: Secondary | ICD-10-CM

## 2013-12-15 DIAGNOSIS — I2589 Other forms of chronic ischemic heart disease: Secondary | ICD-10-CM

## 2013-12-15 DIAGNOSIS — F329 Major depressive disorder, single episode, unspecified: Secondary | ICD-10-CM

## 2013-12-15 DIAGNOSIS — R5383 Other fatigue: Secondary | ICD-10-CM

## 2013-12-15 DIAGNOSIS — F3289 Other specified depressive episodes: Secondary | ICD-10-CM

## 2013-12-15 DIAGNOSIS — I509 Heart failure, unspecified: Secondary | ICD-10-CM

## 2013-12-15 DIAGNOSIS — Z85118 Personal history of other malignant neoplasm of bronchus and lung: Secondary | ICD-10-CM

## 2013-12-15 DIAGNOSIS — E860 Dehydration: Secondary | ICD-10-CM

## 2013-12-15 DIAGNOSIS — R630 Anorexia: Secondary | ICD-10-CM

## 2013-12-15 LAB — COMPREHENSIVE METABOLIC PANEL (CC13)
ALK PHOS: 368 U/L — AB (ref 40–150)
ALT: 33 U/L (ref 0–55)
ANION GAP: 10 meq/L (ref 3–11)
AST: 58 U/L — ABNORMAL HIGH (ref 5–34)
Albumin: 2.6 g/dL — ABNORMAL LOW (ref 3.5–5.0)
BILIRUBIN TOTAL: 0.38 mg/dL (ref 0.20–1.20)
BUN: 33.9 mg/dL — ABNORMAL HIGH (ref 7.0–26.0)
CO2: 25 mEq/L (ref 22–29)
CREATININE: 1.7 mg/dL — AB (ref 0.7–1.3)
Calcium: 9.4 mg/dL (ref 8.4–10.4)
Chloride: 105 mEq/L (ref 98–109)
Glucose: 142 mg/dl — ABNORMAL HIGH (ref 70–140)
Potassium: 4.5 mEq/L (ref 3.5–5.1)
Sodium: 139 mEq/L (ref 136–145)
Total Protein: 7.7 g/dL (ref 6.4–8.3)

## 2013-12-15 LAB — CBC WITH DIFFERENTIAL/PLATELET
BASO%: 0.1 % (ref 0.0–2.0)
Basophils Absolute: 0 10*3/uL (ref 0.0–0.1)
EOS%: 0.8 % (ref 0.0–7.0)
Eosinophils Absolute: 0.1 10*3/uL (ref 0.0–0.5)
HCT: 28 % — ABNORMAL LOW (ref 38.4–49.9)
HGB: 8.8 g/dL — ABNORMAL LOW (ref 13.0–17.1)
LYMPH%: 9.8 % — AB (ref 14.0–49.0)
MCH: 24.7 pg — ABNORMAL LOW (ref 27.2–33.4)
MCHC: 31.5 g/dL — AB (ref 32.0–36.0)
MCV: 78.4 fL — ABNORMAL LOW (ref 79.3–98.0)
MONO#: 0.9 10*3/uL (ref 0.1–0.9)
MONO%: 10 % (ref 0.0–14.0)
NEUT#: 7.5 10*3/uL — ABNORMAL HIGH (ref 1.5–6.5)
NEUT%: 79.3 % — ABNORMAL HIGH (ref 39.0–75.0)
PLATELETS: 231 10*3/uL (ref 140–400)
RBC: 3.57 10*6/uL — AB (ref 4.20–5.82)
RDW: 16.6 % — AB (ref 11.0–14.6)
WBC: 9.5 10*3/uL (ref 4.0–10.3)
lymph#: 0.9 10*3/uL (ref 0.9–3.3)

## 2013-12-15 LAB — TSH CHCC: TSH: 1.051 m(IU)/L (ref 0.320–4.118)

## 2013-12-15 MED ORDER — SODIUM CHLORIDE 0.9 % IV SOLN
3.0000 mg/kg | Freq: Once | INTRAVENOUS | Status: AC
  Administered 2013-12-15: 160 mg via INTRAVENOUS
  Filled 2013-12-15: qty 16

## 2013-12-15 MED ORDER — SODIUM CHLORIDE 0.9 % IJ SOLN
10.0000 mL | INTRAMUSCULAR | Status: DC | PRN
  Administered 2013-12-15: 10 mL
  Filled 2013-12-15: qty 10

## 2013-12-15 MED ORDER — SODIUM CHLORIDE 0.9 % IV SOLN
Freq: Once | INTRAVENOUS | Status: AC
  Administered 2013-12-15: 15:00:00 via INTRAVENOUS

## 2013-12-15 MED ORDER — HEPARIN SOD (PORK) LOCK FLUSH 100 UNIT/ML IV SOLN
500.0000 [IU] | Freq: Once | INTRAVENOUS | Status: AC | PRN
  Administered 2013-12-15: 500 [IU]
  Filled 2013-12-15: qty 5

## 2013-12-15 NOTE — Telephone Encounter (Signed)
[facility] APPT SCHEDULE FOR JUNE PER LT DUE TO MM ON CALL 6/10 SCHEDULE PT W/AJ IN HER BREAK @ 11:15AM.

## 2013-12-15 NOTE — Patient Instructions (Signed)
[facility] Discharge Instructions for Patients Receiving Chemotherapy  Today you received the following chemotherapy agent Nivolumab  To help prevent nausea and vomiting after your treatment, we encourage you to take your nausea medication.   If you develop nausea and vomiting that is not controlled by your nausea medication, call the clinic.   BELOW ARE SYMPTOMS THAT SHOULD BE REPORTED IMMEDIATELY:  *FEVER GREATER THAN 100.5 F  *CHILLS WITH OR WITHOUT FEVER  NAUSEA AND VOMITING THAT IS NOT CONTROLLED WITH YOUR NAUSEA MEDICATION  *UNUSUAL SHORTNESS OF BREATH  *UNUSUAL BRUISING OR BLEEDING  TENDERNESS IN MOUTH AND THROAT WITH OR WITHOUT PRESENCE OF ULCERS  *URINARY PROBLEMS  *BOWEL PROBLEMS  UNUSUAL RASH Items with * indicate a potential emergency and should be followed up as soon as possible.  Feel free to call the clinic you have any questions or concerns. The clinic phone number is [phone].

## 2013-12-15 NOTE — Progress Notes (Addendum)
  [facility] OFFICE PROGRESS NOTE   Diagnosis:  Metastatic non-small cell lung cancer.  INTERVAL HISTORY:   Mr. Motyka returns as scheduled. He completed cycle 3 Nivolumab on 12/01/2013. He denies nausea/vomiting. No mouth sores. No diarrhea. He denies dysphagia. He feels "weak". No shortness breath, cough or fever. He denies joint pain. He has intermittent pain alternately at the right and left abdomen. Appetite is poor. He has lost some weight since his last visit.  Objective:  Vital signs in last 24 hours:  Blood pressure 124/61, pulse 74, temperature 98.1 F (36.7 C), temperature source Oral, resp. rate 18, height 5\' 5"  (1.651 m), weight 110 lb 8 oz (50.122 kg).    HEENT: No thrush or ulcerations. Resp: Lungs clear. Cardio: Regular cardiac rhythm. GI: Abdomen soft and nontender. No hepatomegaly. No mass. Vascular: No leg edema.  Skin: No rash.   Port-A-Cath site is without erythema.   Lab Results:  Lab Results  Component Value Date   WBC 9.5 12/15/2013   HGB 8.8* 12/15/2013   HCT 28.0* 12/15/2013   MCV 78.4* 12/15/2013   PLT 231 12/15/2013   NEUTROABS 7.5* 12/15/2013    Imaging:  No results found.  Medications: I have reviewed the patient's current medications.  Assessment/Plan: 1. Metastatic non-small cell lung cancer, squamous cell carcinoma, with a large anterior mediastinal mass, bilateral lymphadenopathy, liver and bone metastases, initially diagnosed June 2014.   Radiation to anterior chest mass 02/11/2013 through 03/03/2013.   Carboplatin/Taxol every 3 weeks for 6 cycles 03/10/2013 through 07/08/2013.   Restaging CT evaluation 10/22/2013 with stable left upper lobe mass; new hepatic lesions; left iliac wing and left femoral necks sclerotic lesions as before.   Initiation of every 2 week Nivolumab 11/04/2013.   Disposition: Mr. Seda has completed 3 cycles of Nivolumab. He has persistent elevation of alkaline phosphatase, SGOT and creatinine. We  will continue to monitor closely. Plan to proceed with cycle 4 Nivolumab today as scheduled. Restaging CT evaluation prior to next visit in 2 weeks.   Patient seen with Dr. Julien Nordmann.   Owens Shark ANP/GNP-BC   12/15/2013  4:55 PM  ADDENDUM: Hematology/Oncology Attending: I had a face to face encounter with the patient. I recommended his care plan. This is a very pleasant 73 years old Asian male with metastatic non-small cell lung cancer, squamous cell carcinoma who is currently undergoing treatment with immunotherapy with Nivolumab status post 3 cycles and tolerating it fairly well. The patient continues to have intermittent pain at the lower rib cage and upper abdomen in addition to poor appetite and he lost a few more pounds since his last visit. I recommended for him to proceed with cycle #4 of his immunotherapy today as scheduled. He would come back for followup visit in 2 weeks for evaluation after repeating CT scan of the chest, abdomen and pelvis for restaging of his disease. He was encouraged to increase his by mouth intake and eat more snacks. I may consider him for treatment with Marinol or Megace if no improvement in his appetite by the next visit. He was advised to call immediately if he has any concerning symptoms in the interval.  Disclaimer: This note was dictated with voice recognition software. Similar sounding words can inadvertently be transcribed and may not be corrected upon review. Curt Bears, MD 12/16/2013

## 2013-12-16 ENCOUNTER — Encounter: Payer: Self-pay | Admitting: Nurse Practitioner

## 2013-12-27 ENCOUNTER — Ambulatory Visit (HOSPITAL_COMMUNITY)
Admission: RE | Admit: 2013-12-27 | Discharge: 2013-12-27 | Disposition: A | Discharge status: Home / Self Care | Payer: Medicare PPO | Source: Ambulatory Visit | Attending: Nurse Practitioner | Admitting: Nurse Practitioner

## 2013-12-27 ENCOUNTER — Encounter (HOSPITAL_COMMUNITY): Payer: Self-pay

## 2013-12-27 DIAGNOSIS — C3490 Malignant neoplasm of unspecified part of unspecified bronchus or lung: Secondary | ICD-10-CM | POA: Diagnosis present

## 2013-12-27 DIAGNOSIS — I517 Cardiomegaly: Secondary | ICD-10-CM | POA: Diagnosis not present

## 2013-12-27 DIAGNOSIS — C787 Secondary malignant neoplasm of liver and intrahepatic bile duct: Secondary | ICD-10-CM | POA: Diagnosis not present

## 2013-12-27 DIAGNOSIS — I700 Atherosclerosis of aorta: Secondary | ICD-10-CM | POA: Diagnosis not present

## 2013-12-27 DIAGNOSIS — M949 Disorder of cartilage, unspecified: Secondary | ICD-10-CM

## 2013-12-27 DIAGNOSIS — I729 Aneurysm of unspecified site: Secondary | ICD-10-CM | POA: Diagnosis not present

## 2013-12-27 DIAGNOSIS — M899 Disorder of bone, unspecified: Secondary | ICD-10-CM | POA: Insufficient documentation

## 2013-12-27 DIAGNOSIS — K7689 Other specified diseases of liver: Secondary | ICD-10-CM | POA: Diagnosis not present

## 2013-12-29 ENCOUNTER — Telehealth: Payer: Self-pay | Admitting: Internal Medicine

## 2013-12-29 ENCOUNTER — Ambulatory Visit: Payer: Medicare PPO

## 2013-12-29 ENCOUNTER — Other Ambulatory Visit: Payer: Medicare Other

## 2013-12-29 ENCOUNTER — Telehealth: Payer: Self-pay | Admitting: *Deleted

## 2013-12-29 ENCOUNTER — Encounter: Payer: Self-pay | Admitting: Physician Assistant

## 2013-12-29 ENCOUNTER — Ambulatory Visit (HOSPITAL_BASED_OUTPATIENT_CLINIC_OR_DEPARTMENT_OTHER): Payer: Medicare PPO | Admitting: Physician Assistant

## 2013-12-29 VITALS — BP 102/55 | HR 81 | Temp 97.4°F | Resp 17 | Ht 65.0 in | Wt 110.6 lb

## 2013-12-29 DIAGNOSIS — C7952 Secondary malignant neoplasm of bone marrow: Secondary | ICD-10-CM

## 2013-12-29 DIAGNOSIS — C7951 Secondary malignant neoplasm of bone: Secondary | ICD-10-CM

## 2013-12-29 DIAGNOSIS — C3490 Malignant neoplasm of unspecified part of unspecified bronchus or lung: Secondary | ICD-10-CM

## 2013-12-29 DIAGNOSIS — R7989 Other specified abnormal findings of blood chemistry: Secondary | ICD-10-CM

## 2013-12-29 DIAGNOSIS — D649 Anemia, unspecified: Secondary | ICD-10-CM

## 2013-12-29 DIAGNOSIS — R5383 Other fatigue: Secondary | ICD-10-CM

## 2013-12-29 DIAGNOSIS — C787 Secondary malignant neoplasm of liver and intrahepatic bile duct: Secondary | ICD-10-CM

## 2013-12-29 DIAGNOSIS — R630 Anorexia: Secondary | ICD-10-CM

## 2013-12-29 DIAGNOSIS — R5381 Other malaise: Secondary | ICD-10-CM

## 2013-12-29 LAB — COMPREHENSIVE METABOLIC PANEL (CC13)
ALK PHOS: 436 U/L — AB (ref 40–150)
ALT: 34 U/L (ref 0–55)
AST: 63 U/L — ABNORMAL HIGH (ref 5–34)
Albumin: 2.5 g/dL — ABNORMAL LOW (ref 3.5–5.0)
Anion Gap: 8 mEq/L (ref 3–11)
BUN: 38.5 mg/dL — ABNORMAL HIGH (ref 7.0–26.0)
CO2: 24 mEq/L (ref 22–29)
Calcium: 9.8 mg/dL (ref 8.4–10.4)
Chloride: 104 mEq/L (ref 98–109)
Creatinine: 1.9 mg/dL — ABNORMAL HIGH (ref 0.7–1.3)
GLUCOSE: 112 mg/dL (ref 70–140)
Potassium: 4.3 mEq/L (ref 3.5–5.1)
SODIUM: 136 meq/L (ref 136–145)
TOTAL PROTEIN: 7.6 g/dL (ref 6.4–8.3)
Total Bilirubin: 0.92 mg/dL (ref 0.20–1.20)

## 2013-12-29 LAB — CBC WITH DIFFERENTIAL/PLATELET
BASO%: 0.1 % (ref 0.0–2.0)
Basophils Absolute: 0 10*3/uL (ref 0.0–0.1)
EOS%: 0.6 % (ref 0.0–7.0)
Eosinophils Absolute: 0.1 10*3/uL (ref 0.0–0.5)
HCT: 28.3 % — ABNORMAL LOW (ref 38.4–49.9)
HGB: 8.9 g/dL — ABNORMAL LOW (ref 13.0–17.1)
LYMPH%: 8.6 % — ABNORMAL LOW (ref 14.0–49.0)
MCH: 24.1 pg — AB (ref 27.2–33.4)
MCHC: 31.4 g/dL — ABNORMAL LOW (ref 32.0–36.0)
MCV: 76.7 fL — AB (ref 79.3–98.0)
MONO#: 1.2 10*3/uL — ABNORMAL HIGH (ref 0.1–0.9)
MONO%: 9.9 % (ref 0.0–14.0)
NEUT#: 9.3 10*3/uL — ABNORMAL HIGH (ref 1.5–6.5)
NEUT%: 80.8 % — ABNORMAL HIGH (ref 39.0–75.0)
Platelets: 266 10*3/uL (ref 140–400)
RBC: 3.69 10*6/uL — AB (ref 4.20–5.82)
RDW: 16.4 % — ABNORMAL HIGH (ref 11.0–14.6)
WBC: 11.6 10*3/uL — ABNORMAL HIGH (ref 4.0–10.3)
lymph#: 1 10*3/uL (ref 0.9–3.3)

## 2013-12-29 LAB — TSH CHCC: TSH: 1.208 m[IU]/L (ref 0.320–4.118)

## 2013-12-29 MED ORDER — METHYLPREDNISOLONE 4 MG PO KIT: PACK | ORAL | Status: DC

## 2013-12-29 NOTE — Progress Notes (Signed)
[facility] [phone]   [phone]  SHARED VISIT PROGRESS NOTE  Lorretta Harp, MD [address] [place] [address]  DIAGNOSIS: Metastatic non-small cell lung cancer, squamous cell carcinoma with a large anterior mediastinal mass as well as bilateral lymphadenopathy and liver as well as bone metastases diagnosed in June of 2014   PRIOR THERAPY:  1) Palliative radiotherapy to the manubrium under the care of Dr. Tammi Klippel.  2) Systemic chemotherapy with carboplatin for AUC of 5 and paclitaxel 175 mg/M2 every 3 weeks with Neulasta support. Status post 6 cycles   CURRENT THERAPY: Immunotherapy with Nivolumab 3 mg/kilogram every 2 weeks status post 4 cycle.  CHEMOTHERAPY INTENT: Palliative  CURRENT # OF CHEMOTHERAPY CYCLES: 4 CURRENT ANTIEMETICS: Zofran, dexamethasone and Compazine  CURRENT SMOKING STATUS: Former smoker quit 03/13/2009  ORAL CHEMOTHERAPY AND CONSENT: None  CURRENT BISPHOSPHONATES USE: None  PAIN MANAGEMENT: 0/10  NARCOTICS INDUCED CONSTIPATION: None  LIVING WILL AND CODE STATUS: Full code.   INTERVAL HISTORY: [name] 73 y.o. male returns to the clinic today for follow up visit accompanied by a family member who is serving as his interpreter. He still not eating or drinking very well. He complains of significant fatigue. He also complains of numbness in his feet that initially was better but now seems to be feeling worse.  From 11/01/2013 to today his weight is down 8 pounds. He denied having any  night sweats. He denied having any significant nausea or vomiting, diarrhea or constipation. The patient denied having any fever or chills. He has no shortness of breath except with exertion, or hemoptysis. He has occasional pain at the left lower rib cage when taking a deep breath. He is tolerating  his immunotherapy fairly well without skin rash, diarrhea or changes in his baseline shortness of breath. He is here today  to start cycle #5.  MEDICAL HISTORY: Past Medical History  Diagnosis Date  . Hypertension   . Ischemic cardiomyopathy Sept 2011    EF improved to 35-40% 2D  . COPD (chronic obstructive pulmonary disease)   . LBBB (left bundle branch block)   . Acute anterior myocardial infarction 2011    s/p PCI to LAD; 2.25 x 12 mm BMS  . ICD (implantable cardiac defibrillator) in place   . Anginal pain   . Sleep apnea     "while sleeping last week" (11/12/2012)  . Stroke 2009    "sometimes left side goes numb/weak/gives out" (11/12/2012)  . Pacemaker   . Shortness of breath     Hx: of with exertion   . Headache(784.0)   . lung ca w/ mets to liver and bone dx'd 10/2012    ALLERGIES:  is allergic to oxycodone and tramadol.  MEDICATIONS:  Current Outpatient Prescriptions  Medication Sig Dispense Refill  . carvedilol (COREG) 6.25 MG tablet Take 0.5 tablets (3.125 mg total) by mouth 2 (two) times daily with a meal.  60 tablet  4  . isosorbide dinitrate (ISORDIL) 10 MG tablet take 1 tablet by mouth every 8 hours  90 tablet  4  . lidocaine-prilocaine (EMLA) cream Apply topically as needed.  30 g  0  . ramipril (ALTACE) 2.5 MG capsule Take 1 capsule (2.5 mg total) by mouth daily at 12 noon.  30 capsule  4  . feeding supplement, ENSURE COMPLETE, (ENSURE COMPLETE) LIQD Take 237 mLs by mouth 3 (three) times daily with meals.  90 Bottle  1  .  methylPREDNISolone (MEDROL DOSEPAK) 4 MG tablet follow package directions  21 tablet  0   No current facility-administered medications for this visit.    SURGICAL HISTORY:  Past Surgical History  Procedure Laterality Date  . Bi-ventricular implantable cardioverter defibrillator  (crt-d)  11/12/2012  . Coronary angioplasty with stent placement  02/12/2010    LAD BMS  . Insert / replace / remove pacemaker    . Portacath placement Right 02/24/2013    Procedure: INSERTION PORT-A-CATH;  Surgeon: Gaye Pollack, MD;  Location: [facility] OR;  Service: Thoracic;  Laterality:  Right;    REVIEW OF SYSTEMS:  Constitutional: positive for anorexia and fatigue Eyes: negative Ears, nose, mouth, throat, and face: negative Respiratory: positive for dyspnea on exertion Cardiovascular: negative Gastrointestinal: negative Genitourinary:negative Integument/breast: negative Hematologic/lymphatic: negative Musculoskeletal:negative Neurological: positive for paresthesia Behavioral/Psych: negative Endocrine: negative Allergic/Immunologic: negative   PHYSICAL EXAMINATION: General appearance: alert, cooperative, fatigued and no distress Head: Normocephalic, without obvious abnormality, atraumatic Neck: no adenopathy, no JVD, supple, symmetrical, trachea midline and thyroid not enlarged, symmetric, no tenderness/mass/nodules Lymph nodes: Cervical, supraclavicular, and axillary nodes normal. Resp: clear to auscultation bilaterally and normal percussion bilaterally Back: symmetric, no curvature. ROM normal. No CVA tenderness. Cardio: regular rate and rhythm, S1, S2 normal, no murmur, click, rub or gallop GI: soft, non-tender; bowel sounds normal; no masses,  no organomegaly Extremities: extremities normal, atraumatic, no cyanosis or edema Neurologic: Alert and oriented X 3, normal strength and tone. Normal symmetric reflexes. Normal coordination and gait  ECOG PERFORMANCE STATUS: 1 - Symptomatic but completely ambulatory  Blood pressure 102/55, pulse 81, temperature 97.4 F (36.3 C), temperature source Oral, resp. rate 17, height 5\' 5"  (1.651 m), weight 110 lb 9.6 oz (50.168 kg), SpO2 99.00%.  LABORATORY DATA: Lab Results  Component Value Date   WBC 11.6* 12/29/2013   HGB 8.9* 12/29/2013   HCT 28.3* 12/29/2013   MCV 76.7* 12/29/2013   PLT 266 12/29/2013      Chemistry      Component Value Date/Time   NA 136 12/29/2013 1012   NA 136 04/01/2013 0150   K 4.3 12/29/2013 1012   K 4.1 04/01/2013 0150   CL 100 04/01/2013 0150   CO2 24 12/29/2013 1012   CO2 28 04/01/2013 0150    BUN 38.5* 12/29/2013 1012   BUN 48* 04/01/2013 0150   CREATININE 1.9* 12/29/2013 1012   CREATININE 2.13* 04/01/2013 0150      Component Value Date/Time   CALCIUM 9.8 12/29/2013 1012   CALCIUM 8.6 04/01/2013 0150   ALKPHOS 436* 12/29/2013 1012   ALKPHOS 91 02/24/2013 0716   AST 63* 12/29/2013 1012   AST 27 02/24/2013 0716   ALT 34 12/29/2013 1012   ALT 16 02/24/2013 0716   BILITOT 0.92 12/29/2013 1012   BILITOT 0.4 02/24/2013 0716       RADIOGRAPHIC STUDIES:  Ct Abdomen Pelvis Wo Contrast  12/27/2013   CLINICAL DATA:  Non-small cell lung cancer  EXAM: CT CHEST, ABDOMEN AND PELVIS WITHOUT CONTRAST  TECHNIQUE: Multidetector CT imaging of the chest, abdomen and pelvis was performed following the standard protocol without IV contrast.  COMPARISON:  None.  FINDINGS: CT CHEST FINDINGS  There are changes of external beam radiation again noted in both lungs. Index lesion in the left lung apex measures 3.3 cm, image 13/ series 4. Previously 3.2 cm. Stable scar like density within the left base, image 54/series 4.  The heart size is mildly enlarged. There is a left chest wall pacer device  with leads in the coronary sinus, right ventricle and right atrium. No enlarged mediastinal or hilar lymph nodes identified. There is no axillary or supraclavicular adenopathy identified.  CT ABDOMEN AND PELVIS FINDINGS  Extensive multi focal areas of liver metastasis are again identified. There are multiple new areas of low attenuation within the liver. Note: Today's exam was performed without IV contrast material. Index lesion within the lateral segment of left hepatic lobe measures 2.3 cm, image 59/series 2. This is compared with 1.4 cm previously. Posterior right hepatic lobe lesion measures 1.8 cm, image 62/ series 2. Previously this measured 5 mm. New lesion in caudate lobe of liver measures 1.6 cm, image 68/series 2 The gallbladder appears normal. Multiple calcifications within the pancreatic parenchyma are identified. The  spleen is unremarkable.  Normal appearance of the adrenal glands. The kidneys are both unremarkable. Urinary bladder appears normal. The prostate gland appears enlarged and has mass effect upon the bladder base.  There is calcified atherosclerotic disease affecting the abdominal aorta. Aneurysm stress set aortic aneurysm measures 3.9 cm, image 77/series 2. This is unchanged from previous exam. No enlarged upper abdominal lymph nodes. There is no pelvic or inguinal adenopathy. No free fluid or fluid collections within the abdomen or pelvis.  Review of the visualized bony structures again demonstrates a sclerotic lesion within the left iliac wing measuring 1.7 cm, image 100/series 2. This is unchanged from previous exam. Similar appearance of proximal left femur sclerotic lesion, image 125/series 2. No evidence for sclerotic bone metastasis.  IMPRESSION: 1. Stable appearance of left apical lung lesion. 2. Multifocal liver metastasis. Compared with previous exam these appear increased in size and multiplicity, worrisome for progression of disease. In contrast to the previous exam, today's study was performed without IV contrast material. Some of the low-attenuation lesions identified on today's exam may reflect areas of treated tumor. 3. Stable sclerotic bone metastasis. 4. Chronic calcific pancreatitis. 5. Stable infrarenal abdominal aortic aneurysm.   Electronically Signed   By: Kerby Moors M.D.   On: 12/27/2013 16:16   Ct Chest Wo Contrast  12/27/2013   CLINICAL DATA:  Non-small cell lung cancer  EXAM: CT CHEST, ABDOMEN AND PELVIS WITHOUT CONTRAST  TECHNIQUE: Multidetector CT imaging of the chest, abdomen and pelvis was performed following the standard protocol without IV contrast.  COMPARISON:  None.  FINDINGS: CT CHEST FINDINGS  There are changes of external beam radiation again noted in both lungs. Index lesion in the left lung apex measures 3.3 cm, image 13/ series 4. Previously 3.2 cm. Stable scar like  density within the left base, image 54/series 4.  The heart size is mildly enlarged. There is a left chest wall pacer device with leads in the coronary sinus, right ventricle and right atrium. No enlarged mediastinal or hilar lymph nodes identified. There is no axillary or supraclavicular adenopathy identified.  CT ABDOMEN AND PELVIS FINDINGS  Extensive multi focal areas of liver metastasis are again identified. There are multiple new areas of low attenuation within the liver. Note: Today's exam was performed without IV contrast material. Index lesion within the lateral segment of left hepatic lobe measures 2.3 cm, image 59/series 2. This is compared with 1.4 cm previously. Posterior right hepatic lobe lesion measures 1.8 cm, image 62/ series 2. Previously this measured 5 mm. New lesion in caudate lobe of liver measures 1.6 cm, image 68/series 2 The gallbladder appears normal. Multiple calcifications within the pancreatic parenchyma are identified. The spleen is unremarkable.  Normal appearance of the  adrenal glands. The kidneys are both unremarkable. Urinary bladder appears normal. The prostate gland appears enlarged and has mass effect upon the bladder base.  There is calcified atherosclerotic disease affecting the abdominal aorta. Aneurysm stress set aortic aneurysm measures 3.9 cm, image 77/series 2. This is unchanged from previous exam. No enlarged upper abdominal lymph nodes. There is no pelvic or inguinal adenopathy. No free fluid or fluid collections within the abdomen or pelvis.  Review of the visualized bony structures again demonstrates a sclerotic lesion within the left iliac wing measuring 1.7 cm, image 100/series 2. This is unchanged from previous exam. Similar appearance of proximal left femur sclerotic lesion, image 125/series 2. No evidence for sclerotic bone metastasis.  IMPRESSION: 1. Stable appearance of left apical lung lesion. 2. Multifocal liver metastasis. Compared with previous exam these  appear increased in size and multiplicity, worrisome for progression of disease. In contrast to the previous exam, today's study was performed without IV contrast material. Some of the low-attenuation lesions identified on today's exam may reflect areas of treated tumor. 3. Stable sclerotic bone metastasis. 4. Chronic calcific pancreatitis. 5. Stable infrarenal abdominal aortic aneurysm.   Electronically Signed   By: Kerby Moors M.D.   On: 12/27/2013 16:16   ASSESSMENT AND PLAN: This is a very pleasant 73 years old Asian male with metastatic non-small cell lung cancer, squamous cell carcinoma status post palliative radiotherapy to the mediastinum and currently undergoing systemic chemotherapy with carboplatin and paclitaxel status post 6 cycles with partial response.  He is currently undergoing immunotherapy with Nivolumab 3 mg/kilogram every 2 weeks status post 4 cycles. Overall is tolerating this treatment relatively well with no significant adverse effects. Patient was discussed with them and seen by Dr. Julien Nordmann. His creatinine, alkaline phosphatase and AST are all elevated. We will hold cycle #5 today in view of these elevations. He will return in 2 weeks for another symptom management visit and reevaluation of his chemistries. If the liver function studies decreased will resume therapy with Nivolumab.if however they remain increase this would indicate disease progression and we will need to change his treatment. He is encouraged to increase his by mouth intake both of food and fluids and a prescription for a Medrol Dosepak was sent to his pharmacy of record to stimulate his appetite.   For anemia,he is to continue taking over the counter iron tablets twice a day. He was advised to call immediately if he has any concerning symptoms in the interval. The patient voices understanding of current disease status and treatment options and is in agreement with the current care plan.  All questions were  answered. The patient knows to call the clinic with any problems, questions or concerns. We can certainly see the patient much sooner if necessary.  Disclaimer: This note was dictated with voice recognition software. Similar sounding words can inadvertently be transcribed and may not be corrected upon review.  Carlton Adam, PA-C 12/29/2013  ADDENDUM: Hematology/Oncology Attending: I had a face to face encounter with the patient today. I recommended his care plan. This is a very pleasant 73 years old Asian male with metastatic non-small cell lung cancer, squamous cell carcinoma status post induction chemotherapy with carboplatin and paclitaxel with partial response followed by disease progression and the patient recently treated with 4 cycles of immunotherapy with Nivolumab. His recent CT scan of the chest, abdomen and pelvis showed evidence for disease progression especially in the liver and the patient has elevated liver enzymes. I recommended for him  to discontinue his treatment with Nivolumab for now. I would consider the patient for treatment with docetaxel and Cyramza after improvement in his liver enzymes. He would come back for follow up visit in 2 weeks for reevaluation and further discussion of his treatment options. For the lack of appetite, the patient was started on Medrol Dosepak. He was advised to call immediately if he has any concerning symptoms in the interval.  Disclaimer: This note was dictated with voice recognition software. Similar sounding words can inadvertently be transcribed and may not be corrected upon review. Eilleen Kempf., MD 01/03/2014

## 2013-12-29 NOTE — Telephone Encounter (Signed)
Gave pt appt for lab,md and chemo for june and July 2015

## 2013-12-29 NOTE — Telephone Encounter (Signed)
Per staff phone call and POF I have schedueld appts.  JMW  

## 2014-01-01 NOTE — Patient Instructions (Signed)
We are holding her treatment today because of some elevation in your liver enzymes and your renal function. Increase your intake of both food and fluids. Take the Medrol Dosepak as prescribed. Followup in 2 weeks

## 2014-01-04 ENCOUNTER — Other Ambulatory Visit: Payer: Self-pay | Admitting: *Deleted

## 2014-01-04 ENCOUNTER — Telehealth: Payer: Self-pay | Admitting: *Deleted

## 2014-01-04 DIAGNOSIS — C3490 Malignant neoplasm of unspecified part of unspecified bronchus or lung: Secondary | ICD-10-CM

## 2014-01-04 MED ORDER — MORPHINE SULFATE 15 MG PO TABS: 15.0000 mg | ORAL_TABLET | ORAL | Status: DC | PRN

## 2014-01-04 NOTE — Telephone Encounter (Signed)
Received call from pt's case manager with [employer] [phone].  She states that pt is having pain.  I called with a vietnamese interpreter on the phone to translate.  Pt states he is having pain, Specifically in his abdomen.  He does not have any pain medication at home.  Per Dr Vista Mink, okay to give pt rx for morphine sulfate 15mg  tablet every 4 hours prn pain.  Advised pt to use stool softeners as needed and to have his daughter call us if he feels his pain is not getting better.    Called pt's case manager to let her know I spoke to pt.  She verbalized understanding.  She wanted to see if pt should have a palliative care consult and she also felt he looked jaundice.  Will forward information to Dr Vista Mink and Burnetta Sabin.  F/u 6/24.  SLJ

## 2014-01-05 ENCOUNTER — Other Ambulatory Visit: Payer: Medicare PPO

## 2014-01-07 ENCOUNTER — Emergency Department (HOSPITAL_COMMUNITY)
Admission: EM | Admit: 2014-01-07 | Discharge: 2014-01-07 | Disposition: A | Discharge status: Home / Self Care | Payer: Medicare PPO | Attending: Emergency Medicine | Admitting: Emergency Medicine

## 2014-01-07 ENCOUNTER — Encounter (HOSPITAL_COMMUNITY): Payer: Self-pay | Admitting: Emergency Medicine

## 2014-01-07 ENCOUNTER — Emergency Department (HOSPITAL_COMMUNITY): Payer: Medicare PPO

## 2014-01-07 DIAGNOSIS — Z950 Presence of cardiac pacemaker: Secondary | ICD-10-CM | POA: Diagnosis not present

## 2014-01-07 DIAGNOSIS — C787 Secondary malignant neoplasm of liver and intrahepatic bile duct: Secondary | ICD-10-CM | POA: Insufficient documentation

## 2014-01-07 DIAGNOSIS — Z87891 Personal history of nicotine dependence: Secondary | ICD-10-CM | POA: Insufficient documentation

## 2014-01-07 DIAGNOSIS — R112 Nausea with vomiting, unspecified: Secondary | ICD-10-CM | POA: Insufficient documentation

## 2014-01-07 DIAGNOSIS — R109 Unspecified abdominal pain: Secondary | ICD-10-CM | POA: Insufficient documentation

## 2014-01-07 DIAGNOSIS — Z8669 Personal history of other diseases of the nervous system and sense organs: Secondary | ICD-10-CM | POA: Insufficient documentation

## 2014-01-07 DIAGNOSIS — J449 Chronic obstructive pulmonary disease, unspecified: Secondary | ICD-10-CM | POA: Insufficient documentation

## 2014-01-07 DIAGNOSIS — Z79899 Other long term (current) drug therapy: Secondary | ICD-10-CM | POA: Diagnosis not present

## 2014-01-07 DIAGNOSIS — I10 Essential (primary) hypertension: Secondary | ICD-10-CM | POA: Diagnosis not present

## 2014-01-07 DIAGNOSIS — C3490 Malignant neoplasm of unspecified part of unspecified bronchus or lung: Secondary | ICD-10-CM | POA: Diagnosis not present

## 2014-01-07 DIAGNOSIS — Z95810 Presence of automatic (implantable) cardiac defibrillator: Secondary | ICD-10-CM | POA: Diagnosis not present

## 2014-01-07 DIAGNOSIS — J4489 Other specified chronic obstructive pulmonary disease: Secondary | ICD-10-CM | POA: Insufficient documentation

## 2014-01-07 DIAGNOSIS — Z8679 Personal history of other diseases of the circulatory system: Secondary | ICD-10-CM | POA: Insufficient documentation

## 2014-01-07 LAB — COMPREHENSIVE METABOLIC PANEL
ALK PHOS: 472 U/L — AB (ref 39–117)
ALT: 39 U/L (ref 0–53)
AST: 64 U/L — AB (ref 0–37)
Albumin: 2.5 g/dL — ABNORMAL LOW (ref 3.5–5.2)
BILIRUBIN TOTAL: 0.8 mg/dL (ref 0.3–1.2)
BUN: 44 mg/dL — ABNORMAL HIGH (ref 6–23)
CHLORIDE: 98 meq/L (ref 96–112)
CO2: 21 mEq/L (ref 19–32)
Calcium: 11.3 mg/dL — ABNORMAL HIGH (ref 8.4–10.5)
Creatinine, Ser: 1.37 mg/dL — ABNORMAL HIGH (ref 0.50–1.35)
GFR calc Af Amer: 57 mL/min — ABNORMAL LOW (ref >90)
GFR calc non Af Amer: 49 mL/min — ABNORMAL LOW (ref >90)
Glucose, Bld: 98 mg/dL (ref 70–99)
POTASSIUM: 5.6 meq/L — AB (ref 3.7–5.3)
SODIUM: 134 meq/L — AB (ref 137–147)
Total Protein: 8.1 g/dL (ref 6.0–8.3)

## 2014-01-07 LAB — URINALYSIS, ROUTINE W REFLEX MICROSCOPIC
GLUCOSE, UA: NEGATIVE mg/dL
HGB URINE DIPSTICK: NEGATIVE
Ketones, ur: NEGATIVE mg/dL
LEUKOCYTES UA: NEGATIVE
Nitrite: NEGATIVE
Protein, ur: NEGATIVE mg/dL
SPECIFIC GRAVITY, URINE: 1.038 — AB (ref 1.005–1.030)
UROBILINOGEN UA: 1 mg/dL (ref 0.0–1.0)
pH: 5 (ref 5.0–8.0)

## 2014-01-07 LAB — PROTIME-INR
INR: 1.04 (ref 0.00–1.49)
Prothrombin Time: 13.4 seconds (ref 11.6–15.2)

## 2014-01-07 LAB — CBC WITH DIFFERENTIAL/PLATELET
BASOS ABS: 0 10*3/uL (ref 0.0–0.1)
BASOS PCT: 0 % (ref 0–1)
Eosinophils Absolute: 0 10*3/uL (ref 0.0–0.7)
Eosinophils Relative: 0 % (ref 0–5)
HCT: 30.7 % — ABNORMAL LOW (ref 39.0–52.0)
Hemoglobin: 10.1 g/dL — ABNORMAL LOW (ref 13.0–17.0)
Lymphocytes Relative: 6 % — ABNORMAL LOW (ref 12–46)
Lymphs Abs: 0.9 10*3/uL (ref 0.7–4.0)
MCH: 25.2 pg — ABNORMAL LOW (ref 26.0–34.0)
MCHC: 32.9 g/dL (ref 30.0–36.0)
MCV: 76.6 fL — ABNORMAL LOW (ref 78.0–100.0)
MONOS PCT: 11 % (ref 3–12)
Monocytes Absolute: 1.5 10*3/uL — ABNORMAL HIGH (ref 0.1–1.0)
NEUTROS ABS: 11.9 10*3/uL — AB (ref 1.7–7.7)
NEUTROS PCT: 83 % — AB (ref 43–77)
PLATELETS: 312 10*3/uL (ref 150–400)
RBC: 4.01 MIL/uL — ABNORMAL LOW (ref 4.22–5.81)
RDW: 17.5 % — AB (ref 11.5–15.5)
WBC: 14.3 10*3/uL — ABNORMAL HIGH (ref 4.0–10.5)

## 2014-01-07 LAB — LIPASE, BLOOD: Lipase: 12 U/L (ref 11–59)

## 2014-01-07 MED ORDER — IOHEXOL 300 MG/ML  SOLN
100.0000 mL | Freq: Once | INTRAMUSCULAR | Status: AC | PRN
  Administered 2014-01-07: 100 mL via INTRAVENOUS

## 2014-01-07 MED ORDER — ONDANSETRON HCL 4 MG/2ML IJ SOLN
4.0000 mg | Freq: Once | INTRAMUSCULAR | Status: AC
  Administered 2014-01-07: 4 mg via INTRAVENOUS
  Filled 2014-01-07: qty 2

## 2014-01-07 MED ORDER — ONDANSETRON 4 MG PO TBDP: 4.0000 mg | ORAL_TABLET | Freq: Four times a day (QID) | ORAL | Status: DC | PRN

## 2014-01-07 MED ORDER — IOHEXOL 300 MG/ML  SOLN
25.0000 mL | INTRAMUSCULAR | Status: AC
  Administered 2014-01-07: 25 mL via ORAL

## 2014-01-07 MED ORDER — FENTANYL CITRATE 0.05 MG/ML IJ SOLN
100.0000 ug | Freq: Once | INTRAMUSCULAR | Status: AC
  Administered 2014-01-07: 100 ug via INTRAVENOUS
  Filled 2014-01-07: qty 2

## 2014-01-07 MED ORDER — SODIUM CHLORIDE 0.9 % IV BOLUS (SEPSIS)
1000.0000 mL | Freq: Once | INTRAVENOUS | Status: AC
  Administered 2014-01-07: 1000 mL via INTRAVENOUS

## 2014-01-07 NOTE — ED Notes (Signed)
Pt attempted to urinate, pt unable to void, pt encouraged to provide urine sample as soon as possible

## 2014-01-07 NOTE — ED Provider Notes (Signed)
CSN: [id number]     Arrival date & time 01/07/14  0911 History   First MD Initiated Contact with Patient 01/07/14 [phone]     Chief Complaint  Patient presents with  . Abdominal Pain  . Emesis     (Consider location/radiation/quality/duration/timing/severity/associated sxs/prior Treatment) HPI 73 year old male with a history of lung cancer with metastasis to his liver presents with abdominal pain for a month. He started having vomiting last night. Has vomited 3 times. The pain is only a 5/10 when lying flat and still, however when he gets up he becomes nauseous and his pain worsens. He's not been on any pain medicine until morphine was called and just a day or 2 ago by his oncologist. He's been having constipation during this time as well. He had a small bowel movement a couple days ago. He has not had any diarrhea or blood in stool. No urinary symptoms. No back pain. The patient's daughter is interpreting at the bedside.   Past Medical History  Diagnosis Date  . Hypertension   . Ischemic cardiomyopathy Sept 2011    EF improved to 35-40% 2D  . COPD (chronic obstructive pulmonary disease)   . LBBB (left bundle branch block)   . Acute anterior myocardial infarction 2011    s/p PCI to LAD; 2.25 x 12 mm BMS  . ICD (implantable cardiac defibrillator) in place   . Anginal pain   . Sleep apnea     "while sleeping last week" (11/12/2012)  . Stroke 2009    "sometimes left side goes numb/weak/gives out" (11/12/2012)  . Pacemaker   . Shortness of breath     Hx: of with exertion   . Headache(784.0)   . 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 Bi-ventricular implantable cardioverter defibrillator  (crt-d)  11/12/2012  . Coronary angioplasty with stent placement  02/12/2010    LAD BMS  . Insert / replace / remove pacemaker    . Portacath placement Right 02/24/2013    Procedure: INSERTION PORT-A-CATH;  Surgeon: Gaye Pollack, MD;   Location: [facility];  Service: Thoracic;  Laterality: Right;   No family history on file. History  Substance Use Topics  . Smoking status: Former Smoker -- 0.50 packs/day for 45 years    Types: Cigarettes    Quit date: 03/13/2009  . Smokeless tobacco: Never Used  . Alcohol Use: No     Comment: 11/12/2012 "last alcohol 2008; never had problem w/it"    Review of Systems  Constitutional: Negative for fever.  Gastrointestinal: Positive for nausea, vomiting, abdominal pain and constipation. Negative for diarrhea, blood in stool and abdominal distention.  Genitourinary: Negative for dysuria.  Musculoskeletal: Negative for back pain.  All other systems reviewed and are negative.     Allergies  Oxycodone and Tramadol  Home Medications   Prior to Admission medications   Medication Sig Start Date End Date Taking? Authorizing Durell Lofaso  carvedilol (COREG) 6.25 MG tablet Take 0.5 tablets (3.125 mg total) by mouth 2 (two) times daily with a meal. 11/18/13   Lorretta Harp, MD  feeding supplement, ENSURE COMPLETE, (ENSURE COMPLETE) LIQD Take 237 mLs by mouth 3 (three) times daily with meals. 12/07/13   Curt Bears, MD  isosorbide dinitrate (ISORDIL) 10 MG tablet take 1 tablet by mouth every 8 hours 07/03/13   Lorretta Harp, MD  lidocaine-prilocaine (EMLA) cream Apply topically as needed. 12/01/13   Curt Bears,  MD  methylPREDNISolone (MEDROL DOSEPAK) 4 MG tablet follow package directions 12/29/13   Carlton Adam, PA-C  morphine (MSIR) 15 MG tablet Take 1 tablet (15 mg total) by mouth every 4 (four) hours as needed for severe pain. 01/04/14   Curt Bears, MD  ramipril (ALTACE) 2.5 MG capsule Take 1 capsule (2.5 mg total) by mouth daily at 12 noon. 11/18/13   Lorretta Harp, MD   BP 153/93  Pulse 75  Temp(Src) 97.6 F (36.4 C) (Oral)  Resp 21  Ht 5\' 5"  (1.651 m)  Wt 110 lb (49.896 kg)  BMI 18.31 kg/m2  SpO2 96% Physical Exam  Nursing note and vitals  reviewed. Constitutional: He is oriented to person, place, and time. He appears well-developed and well-nourished. No distress.  HENT:  Head: Normocephalic and atraumatic.  Right Ear: External ear normal.  Left Ear: External ear normal.  Nose: Nose normal.  Eyes: Right eye exhibits no discharge. Left eye exhibits no discharge.  Neck: Neck supple.  Cardiovascular: Normal rate, regular rhythm, normal heart sounds and intact distal pulses.   Pulmonary/Chest: Effort normal and breath sounds normal.  Abdominal: Soft. He exhibits no distension. There is no tenderness.  Musculoskeletal: He exhibits no edema.  Neurological: He is alert and oriented to person, place, and time.  Skin: Skin is warm and dry.    ED Course  Procedures (including critical care time) Labs Review Labs Reviewed  CBC WITH DIFFERENTIAL - Abnormal; Notable for the following:    WBC 14.3 (*)    RBC 4.01 (*)    Hemoglobin 10.1 (*)    HCT 30.7 (*)    MCV 76.6 (*)    MCH 25.2 (*)    RDW 17.5 (*)    Neutrophils Relative % 83 (*)    Neutro Abs 11.9 (*)    Lymphocytes Relative 6 (*)    Monocytes Absolute 1.5 (*)    All other components within normal limits  COMPREHENSIVE METABOLIC PANEL - Abnormal; Notable for the following:    Sodium 134 (*)    Potassium 5.6 (*)    BUN 44 (*)    Creatinine, Ser 1.37 (*)    Calcium 11.3 (*)    Albumin 2.5 (*)    AST 64 (*)    Alkaline Phosphatase 472 (*)    GFR calc non Af Amer 49 (*)    GFR calc Af Amer 57 (*)    All other components within normal limits  URINALYSIS, ROUTINE W REFLEX MICROSCOPIC - Abnormal; Notable for the following:    Color, Urine AMBER (*)    Specific Gravity, Urine 1.038 (*)    Bilirubin Urine SMALL (*)    All other components within normal limits  PROTIME-INR  LIPASE, BLOOD    Imaging Review Ct Abdomen Pelvis W Contrast  01/07/2014   CLINICAL DATA:  Metastatic lung cancer to liver and bone post chemotherapy, BILATERAL lower abdominal pain and  vomiting which began yesterday, additional past history hypertension, coronary artery disease post MI and coronary PTCA with ischemic cardiomyopathy, COPD, former smoker, stroke  EXAM: CT ABDOMEN AND PELVIS WITH CONTRAST  TECHNIQUE: Multidetector CT imaging of the abdomen and pelvis was performed using the standard protocol following bolus administration of intravenous contrast. Sagittal and coronal MPR images reconstructed from axial data set.  CONTRAST:  119mL OMNIPAQUE IOHEXOL 300 MG/ML SOLN IV. Dilute oral contrast.  COMPARISON:  12/27/2013, 10/22/2013  FINDINGS: Minimal RIGHT pleural effusion and bibasilar atelectasis.  Pacemaker/AICD leads RIGHT atrium, RIGHT ventricle  and coronary sinus.  Extensive atherosclerotic calcification aorta and coronary arteries with aneurysmal dilatation of the infrarenal abdominal aorta up to a greatest size of 3.8 cm AP x 4.2 cm transverse x ~5 cm length.  Numerous low-attenuation foci with peripheral enhancement within liver compatible with hepatic metastases, increased in size and number since earlier study of 10/22/2013.  BILATERAL renal cortical atrophy and cortical cysts.  Hepatomegaly with displacement of aorta, duodenum, stomach and pancreas to the LEFT.  Spleen unremarkable.  Scattered pancreatic calcifications.  Unremarkable bladder and ureters with minimal prostatic enlargement.  Stomach and bowel loops normal appearance.  Small amount of nonspecific free fluid perihepatic.  No adenopathy, bowel wall thickening, or free air.  Bones demineralized with multifactorial AP spinal stenosis at L2-L3 and minimal retrolisthesis at L3-L4 and L4-L5.  Question sclerotic metastasis of the posterior RIGHT tenth rib and LEFT femoral neck.  IMPRESSION: Increased hepatic metastatic disease with hepatomegaly and displacement of upper abdominal organs to the LEFT.  Minimal nonspecific RIGHT pleural effusion and perihepatic ascites.  Chronic calcific pancreatitis.  Renal cysts  Question  osseous metastases of posterior RIGHT tenth rib and LEFT femoral neck.  Abdominal aortic aneurysm 3.8 x 4.2 cm in axial dimensions and ~5 cm length.   Electronically Signed   By: Lavonia Dana M.D.   On: 01/07/2014 13:40     EKG Interpretation None      MDM   Final diagnoses:  Abdominal pain, unspecified abdominal location  Liver metastases  Non-intractable vomiting with nausea, vomiting of unspecified type    Patient has no abdominal pain on exam. He at one episode of vomiting while drinking contrast, but otherwise has appeared well and remained stable in the ER. His CT does show worsening liver metastases. Also questionable bone metastases. At this time as he is asymptomatic, I feel he is stable for discharge to follow up with his oncologist.    Ephraim Hamburger, MD 01/07/14 [phone]

## 2014-01-07 NOTE — ED Notes (Signed)
NT reports pt was feeling nausea & spitting up post completing PO contrast for CT, Regenia Skeeter, MD informed, CT aware pt has completed oral contrast

## 2014-01-07 NOTE — ED Notes (Signed)
Pt c/o abd pain bil lower quadrants & vomiting onset yesterday, pt reports x 3 vomiting episodes, denies bloody or dark colored vomitus, pt denies diarrhea, last chemo for lung CA that mets to the liver was 2 weeks ago, pt A&O x4, follows commands, speaks in complete sentences

## 2014-01-12 ENCOUNTER — Other Ambulatory Visit: Payer: Self-pay | Admitting: *Deleted

## 2014-01-12 ENCOUNTER — Ambulatory Visit: Payer: Medicare PPO | Admitting: Physician Assistant

## 2014-01-12 ENCOUNTER — Other Ambulatory Visit: Payer: Medicare PPO

## 2014-01-12 ENCOUNTER — Ambulatory Visit: Payer: Medicare PPO

## 2014-01-14 ENCOUNTER — Telehealth: Payer: Self-pay | Admitting: Internal Medicine

## 2014-01-14 ENCOUNTER — Telehealth: Payer: Self-pay | Admitting: *Deleted

## 2014-01-14 NOTE — Telephone Encounter (Signed)
Per staff phone call and POF I have schedueld appts. Scheduler advised of appts.  JMW  

## 2014-01-18 ENCOUNTER — Emergency Department (HOSPITAL_COMMUNITY): Payer: Medicare PPO

## 2014-01-18 ENCOUNTER — Encounter (HOSPITAL_COMMUNITY): Payer: Self-pay

## 2014-01-18 ENCOUNTER — Inpatient Hospital Stay (HOSPITAL_COMMUNITY)
Admission: EM | Admit: 2014-01-18 | Discharge: 2014-01-22 | DRG: 435 | Disposition: A | Discharge status: Hospice | Payer: Medicare PPO | Attending: Internal Medicine | Admitting: Internal Medicine

## 2014-01-18 DIAGNOSIS — C787 Secondary malignant neoplasm of liver and intrahepatic bile duct: Principal | ICD-10-CM

## 2014-01-18 DIAGNOSIS — Z515 Encounter for palliative care: Secondary | ICD-10-CM

## 2014-01-18 DIAGNOSIS — N183 Chronic kidney disease, stage 3 unspecified: Secondary | ICD-10-CM

## 2014-01-18 DIAGNOSIS — I160 Hypertensive urgency: Secondary | ICD-10-CM

## 2014-01-18 DIAGNOSIS — I252 Old myocardial infarction: Secondary | ICD-10-CM | POA: Diagnosis not present

## 2014-01-18 DIAGNOSIS — Z8673 Personal history of transient ischemic attack (TIA), and cerebral infarction without residual deficits: Secondary | ICD-10-CM | POA: Diagnosis not present

## 2014-01-18 DIAGNOSIS — E43 Unspecified severe protein-calorie malnutrition: Secondary | ICD-10-CM | POA: Diagnosis present

## 2014-01-18 DIAGNOSIS — Z66 Do not resuscitate: Secondary | ICD-10-CM | POA: Diagnosis present

## 2014-01-18 DIAGNOSIS — I739 Peripheral vascular disease, unspecified: Secondary | ICD-10-CM

## 2014-01-18 DIAGNOSIS — Z91199 Patient's noncompliance with other medical treatment and regimen due to unspecified reason: Secondary | ICD-10-CM

## 2014-01-18 DIAGNOSIS — I200 Unstable angina: Secondary | ICD-10-CM

## 2014-01-18 DIAGNOSIS — F411 Generalized anxiety disorder: Secondary | ICD-10-CM | POA: Diagnosis present

## 2014-01-18 DIAGNOSIS — Z79899 Other long term (current) drug therapy: Secondary | ICD-10-CM | POA: Diagnosis not present

## 2014-01-18 DIAGNOSIS — C3402 Malignant neoplasm of left main bronchus: Secondary | ICD-10-CM

## 2014-01-18 DIAGNOSIS — C7952 Secondary malignant neoplasm of bone marrow: Secondary | ICD-10-CM

## 2014-01-18 DIAGNOSIS — Z9861 Coronary angioplasty status: Secondary | ICD-10-CM | POA: Diagnosis not present

## 2014-01-18 DIAGNOSIS — R945 Abnormal results of liver function studies: Secondary | ICD-10-CM

## 2014-01-18 DIAGNOSIS — G8190 Hemiplegia, unspecified affecting unspecified side: Secondary | ICD-10-CM

## 2014-01-18 DIAGNOSIS — C7951 Secondary malignant neoplasm of bone: Secondary | ICD-10-CM | POA: Diagnosis present

## 2014-01-18 DIAGNOSIS — I5042 Chronic combined systolic (congestive) and diastolic (congestive) heart failure: Secondary | ICD-10-CM | POA: Diagnosis present

## 2014-01-18 DIAGNOSIS — Z955 Presence of coronary angioplasty implant and graft: Secondary | ICD-10-CM

## 2014-01-18 DIAGNOSIS — R627 Adult failure to thrive: Secondary | ICD-10-CM | POA: Diagnosis present

## 2014-01-18 DIAGNOSIS — I951 Orthostatic hypotension: Secondary | ICD-10-CM

## 2014-01-18 DIAGNOSIS — C3400 Malignant neoplasm of unspecified main bronchus: Secondary | ICD-10-CM

## 2014-01-18 DIAGNOSIS — I509 Heart failure, unspecified: Secondary | ICD-10-CM | POA: Diagnosis present

## 2014-01-18 DIAGNOSIS — J4489 Other specified chronic obstructive pulmonary disease: Secondary | ICD-10-CM | POA: Diagnosis present

## 2014-01-18 DIAGNOSIS — I447 Left bundle-branch block, unspecified: Secondary | ICD-10-CM

## 2014-01-18 DIAGNOSIS — Z95810 Presence of automatic (implantable) cardiac defibrillator: Secondary | ICD-10-CM

## 2014-01-18 DIAGNOSIS — Z87891 Personal history of nicotine dependence: Secondary | ICD-10-CM

## 2014-01-18 DIAGNOSIS — R0789 Other chest pain: Secondary | ICD-10-CM

## 2014-01-18 DIAGNOSIS — Z681 Body mass index (BMI) 19 or less, adult: Secondary | ICD-10-CM

## 2014-01-18 DIAGNOSIS — J449 Chronic obstructive pulmonary disease, unspecified: Secondary | ICD-10-CM | POA: Diagnosis present

## 2014-01-18 DIAGNOSIS — R1310 Dysphagia, unspecified: Secondary | ICD-10-CM

## 2014-01-18 DIAGNOSIS — C3490 Malignant neoplasm of unspecified part of unspecified bronchus or lung: Secondary | ICD-10-CM | POA: Diagnosis present

## 2014-01-18 DIAGNOSIS — I679 Cerebrovascular disease, unspecified: Secondary | ICD-10-CM

## 2014-01-18 DIAGNOSIS — I10 Essential (primary) hypertension: Secondary | ICD-10-CM | POA: Diagnosis present

## 2014-01-18 DIAGNOSIS — R42 Dizziness and giddiness: Secondary | ICD-10-CM

## 2014-01-18 DIAGNOSIS — D638 Anemia in other chronic diseases classified elsewhere: Secondary | ICD-10-CM

## 2014-01-18 DIAGNOSIS — G4730 Sleep apnea, unspecified: Secondary | ICD-10-CM | POA: Diagnosis present

## 2014-01-18 DIAGNOSIS — I255 Ischemic cardiomyopathy: Secondary | ICD-10-CM

## 2014-01-18 DIAGNOSIS — E785 Hyperlipidemia, unspecified: Secondary | ICD-10-CM

## 2014-01-18 DIAGNOSIS — R630 Anorexia: Secondary | ICD-10-CM

## 2014-01-18 DIAGNOSIS — I5041 Acute combined systolic (congestive) and diastolic (congestive) heart failure: Secondary | ICD-10-CM

## 2014-01-18 DIAGNOSIS — Z9119 Patient's noncompliance with other medical treatment and regimen: Secondary | ICD-10-CM

## 2014-01-18 DIAGNOSIS — E860 Dehydration: Secondary | ICD-10-CM | POA: Diagnosis not present

## 2014-01-18 DIAGNOSIS — R7989 Other specified abnormal findings of blood chemistry: Secondary | ICD-10-CM

## 2014-01-18 DIAGNOSIS — J9859 Other diseases of mediastinum, not elsewhere classified: Secondary | ICD-10-CM

## 2014-01-18 HISTORY — DX: Do not resuscitate: Z66

## 2014-01-18 LAB — COMPREHENSIVE METABOLIC PANEL
ALK PHOS: 587 U/L — AB (ref 39–117)
ALT: 66 U/L — AB (ref 0–53)
AST: 117 U/L — AB (ref 0–37)
Albumin: 2.4 g/dL — ABNORMAL LOW (ref 3.5–5.2)
BILIRUBIN TOTAL: 1.5 mg/dL — AB (ref 0.3–1.2)
BUN: 57 mg/dL — ABNORMAL HIGH (ref 6–23)
CALCIUM: 12.8 mg/dL — AB (ref 8.4–10.5)
CHLORIDE: 96 meq/L (ref 96–112)
CO2: 28 meq/L (ref 19–32)
Creatinine, Ser: 1.3 mg/dL (ref 0.50–1.35)
GFR calc non Af Amer: 52 mL/min — ABNORMAL LOW (ref >90)
GFR, EST AFRICAN AMERICAN: 61 mL/min — AB (ref >90)
Glucose, Bld: 85 mg/dL (ref 70–99)
Potassium: 4.5 mEq/L (ref 3.7–5.3)
SODIUM: 136 meq/L — AB (ref 137–147)
Total Protein: 7.6 g/dL (ref 6.0–8.3)

## 2014-01-18 LAB — CBC WITH DIFFERENTIAL/PLATELET
BASOS ABS: 0 10*3/uL (ref 0.0–0.1)
Basophils Relative: 0 % (ref 0–1)
Eosinophils Absolute: 0 10*3/uL (ref 0.0–0.7)
Eosinophils Relative: 0 % (ref 0–5)
HEMATOCRIT: 34 % — AB (ref 39.0–52.0)
Hemoglobin: 11.1 g/dL — ABNORMAL LOW (ref 13.0–17.0)
LYMPHS ABS: 0.9 10*3/uL (ref 0.7–4.0)
LYMPHS PCT: 5 % — AB (ref 12–46)
MCH: 25.5 pg — ABNORMAL LOW (ref 26.0–34.0)
MCHC: 32.6 g/dL (ref 30.0–36.0)
MCV: 78 fL (ref 78.0–100.0)
Monocytes Absolute: 1.6 10*3/uL — ABNORMAL HIGH (ref 0.1–1.0)
Monocytes Relative: 8 % (ref 3–12)
NEUTROS ABS: 18.1 10*3/uL — AB (ref 1.7–7.7)
Neutrophils Relative %: 87 % — ABNORMAL HIGH (ref 43–77)
PLATELETS: 270 10*3/uL (ref 150–400)
RBC: 4.36 MIL/uL (ref 4.22–5.81)
RDW: 18.9 % — AB (ref 11.5–15.5)
WBC: 20.7 10*3/uL — AB (ref 4.0–10.5)

## 2014-01-18 LAB — URINALYSIS, ROUTINE W REFLEX MICROSCOPIC
GLUCOSE, UA: NEGATIVE mg/dL
KETONES UR: NEGATIVE mg/dL
Nitrite: NEGATIVE
PROTEIN: NEGATIVE mg/dL
Specific Gravity, Urine: 1.025 (ref 1.005–1.030)
UROBILINOGEN UA: 1 mg/dL (ref 0.0–1.0)
pH: 5.5 (ref 5.0–8.0)

## 2014-01-18 LAB — POC OCCULT BLOOD, ED: FECAL OCCULT BLD: NEGATIVE

## 2014-01-18 LAB — LIPASE, BLOOD: Lipase: 19 U/L (ref 11–59)

## 2014-01-18 LAB — URINE MICROSCOPIC-ADD ON

## 2014-01-18 LAB — MAGNESIUM: MAGNESIUM: 2.2 mg/dL (ref 1.5–2.5)

## 2014-01-18 LAB — PHOSPHORUS: Phosphorus: 2.6 mg/dL (ref 2.3–4.6)

## 2014-01-18 MED ORDER — ONDANSETRON HCL 4 MG PO TABS: 4.0000 mg | ORAL_TABLET | Freq: Four times a day (QID) | ORAL | Status: DC | PRN

## 2014-01-18 MED ORDER — ONDANSETRON HCL 4 MG/2ML IJ SOLN: 4.0000 mg | Freq: Three times a day (TID) | INTRAMUSCULAR | Status: DC | PRN

## 2014-01-18 MED ORDER — CHLORHEXIDINE GLUCONATE 0.12 % MT SOLN
15.0000 mL | Freq: Two times a day (BID) | OROMUCOSAL | Status: DC
  Administered 2014-01-20 – 2014-01-21 (×3): 15 mL via OROMUCOSAL
  Filled 2014-01-18 (×11): qty 15

## 2014-01-18 MED ORDER — LIDOCAINE-PRILOCAINE 2.5-2.5 % EX CREA
1.0000 "application " | TOPICAL_CREAM | CUTANEOUS | Status: DC | PRN
  Filled 2014-01-18: qty 5

## 2014-01-18 MED ORDER — ONDANSETRON HCL 4 MG/2ML IJ SOLN
4.0000 mg | Freq: Once | INTRAMUSCULAR | Status: AC
  Administered 2014-01-18: 4 mg via INTRAVENOUS
  Filled 2014-01-18: qty 2

## 2014-01-18 MED ORDER — SODIUM CHLORIDE 0.9 % IV BOLUS (SEPSIS)
1000.0000 mL | Freq: Once | INTRAVENOUS | Status: AC
  Administered 2014-01-18: 1000 mL via INTRAVENOUS

## 2014-01-18 MED ORDER — BIOTENE DRY MOUTH MT LIQD
15.0000 mL | Freq: Two times a day (BID) | OROMUCOSAL | Status: DC
  Administered 2014-01-18 – 2014-01-21 (×5): 15 mL via OROMUCOSAL

## 2014-01-18 MED ORDER — SODIUM CHLORIDE 0.9 % IV SOLN: INTRAVENOUS | Status: AC

## 2014-01-18 MED ORDER — ADULT MULTIVITAMIN W/MINERALS CH
1.0000 | ORAL_TABLET | Freq: Every day | ORAL | Status: DC
  Administered 2014-01-18 – 2014-01-21 (×4): 1 via ORAL
  Filled 2014-01-18 (×5): qty 1

## 2014-01-18 MED ORDER — MORPHINE SULFATE 4 MG/ML IJ SOLN
4.0000 mg | Freq: Once | INTRAMUSCULAR | Status: AC
  Administered 2014-01-18: 4 mg via INTRAVENOUS
  Filled 2014-01-18: qty 1

## 2014-01-18 MED ORDER — CARVEDILOL 3.125 MG PO TABS
3.1250 mg | ORAL_TABLET | Freq: Two times a day (BID) | ORAL | Status: DC
  Administered 2014-01-18 – 2014-01-22 (×7): 3.125 mg via ORAL
  Filled 2014-01-18 (×11): qty 1

## 2014-01-18 MED ORDER — DEXTROSE-NACL 5-0.9 % IV SOLN
INTRAVENOUS | Status: DC
  Administered 2014-01-18: 14:00:00 via INTRAVENOUS
  Administered 2014-01-19: 75 mL/h via INTRAVENOUS
  Administered 2014-01-19: 12:00:00 via INTRAVENOUS
  Administered 2014-01-20: 75 mL/h via INTRAVENOUS
  Administered 2014-01-21: 10:00:00 via INTRAVENOUS

## 2014-01-18 MED ORDER — RAMIPRIL 2.5 MG PO CAPS
2.5000 mg | ORAL_CAPSULE | Freq: Every day | ORAL | Status: DC
  Administered 2014-01-18 – 2014-01-21 (×3): 2.5 mg via ORAL
  Filled 2014-01-18 (×5): qty 1

## 2014-01-18 MED ORDER — ONDANSETRON HCL 4 MG/2ML IJ SOLN: 4.0000 mg | Freq: Four times a day (QID) | INTRAMUSCULAR | Status: DC | PRN

## 2014-01-18 MED ORDER — ENSURE COMPLETE PO LIQD
237.0000 mL | Freq: Three times a day (TID) | ORAL | Status: DC
  Administered 2014-01-19 – 2014-01-21 (×5): 237 mL via ORAL

## 2014-01-18 MED ORDER — HEPARIN SODIUM (PORCINE) 5000 UNIT/ML IJ SOLN
5000.0000 [IU] | Freq: Three times a day (TID) | INTRAMUSCULAR | Status: DC
  Administered 2014-01-18 – 2014-01-20 (×6): 5000 [IU] via SUBCUTANEOUS
  Filled 2014-01-18 (×11): qty 1

## 2014-01-18 MED ORDER — MORPHINE SULFATE 15 MG PO TABS
15.0000 mg | ORAL_TABLET | ORAL | Status: DC | PRN
  Administered 2014-01-18 – 2014-01-19 (×3): 15 mg via ORAL
  Filled 2014-01-18 (×4): qty 1

## 2014-01-18 MED ORDER — ISOSORBIDE DINITRATE 10 MG PO TABS
10.0000 mg | ORAL_TABLET | Freq: Three times a day (TID) | ORAL | Status: DC
  Administered 2014-01-18 – 2014-01-22 (×9): 10 mg via ORAL
  Filled 2014-01-18 (×15): qty 1

## 2014-01-18 NOTE — ED Notes (Signed)
Per daughter- pt declines medication at this time. Pt resting without complaint

## 2014-01-18 NOTE — ED Notes (Signed)
Family at bedside. 

## 2014-01-18 NOTE — ED Notes (Signed)
CYNTHIA RN GIVEN REPORT QWENN RN WILL RECEIVE THIS PT. DAUGHTER WILL TAKE BELONGINGS HOME

## 2014-01-18 NOTE — ED Notes (Signed)
Patient with Ultrasound at [facility]

## 2014-01-18 NOTE — ED Notes (Signed)
Bed: DT14 Expected date: 01/18/14 Expected time: 6:59 AM Means of arrival: Ambulance Comments: Lung CA

## 2014-01-18 NOTE — ED Notes (Signed)
Attempted to collect urine. Patient has not produced enough for a sample.

## 2014-01-18 NOTE — Progress Notes (Signed)
INITIAL NUTRITION ASSESSMENT  Pt meets criteria for severe MALNUTRITION in the context of chronic illness as evidenced by 8% weight loss in the past month with visible severe muscle wasting and subcutaneous fat loss in orbital and temporal region, clavicles, upper arms, and hands.  DOCUMENTATION CODES Per approved criteria  -Severe malnutrition in the context of chronic illness -Underweight   INTERVENTION: - Ensure Complete TID - Assisted pt with ordering dinner - Recommend MD discuss appetite stimulants - RD to continue to monitor   NUTRITION DIAGNOSIS: Inadequate oral intake related to problems swallowing as evidenced by pt report.    Goal: Pt to consume >90% of meals/supplements  Monitor:  Weights, labs, intake  Reason for Assessment: Malnutrition screening tool, consult for assessment   73 y.o. male  Admitting Dx: Failure to thrive in adult  ASSESSMENT: Pt with history of metastatic lung cancer, combined systolic and diastolic heart failure, and COPD who presented to the ED with complaints of progressive weakness and poor oral intake. Pt speaks [place], used [employer] to talk to pt.   - Pt reports he's been very weak recently and eating very little - Couldn't remember what he has been eating but states he has been drinking 2 Ensure/day - Says he's been hungry but can't swallow things - could not elaborate but said that "things just aren't digesting" - Had some coughing during visit - C/o rib and stomach pain, notified RN - Visibly cachetic in face, shoulders, and upper arms - Pt reports he's been gradually losing weight - Weight trend shows pt's weight down 9 pounds in the past month - Has been followed by outpatient [place] RD  Alk phos, AST/ALT, and total bilirubin elevated   Height: Ht Readings from Last 1 Encounters:  01/18/14 $RemoveB'5\' 6"'roEPEKYp$  (1.676 m)    Weight: Wt Readings from Last 1 Encounters:  01/18/14 101 lb 8 oz (46.04  kg)    Ideal Body Weight: 142 lbs   % Ideal Body Weight: 71%  Wt Readings from Last 10 Encounters:  01/18/14 101 lb 8 oz (46.04 kg)  01/07/14 110 lb (49.896 kg)  12/29/13 110 lb 9.6 oz (50.168 kg)  12/15/13 110 lb 8 oz (50.122 kg)  12/01/13 114 lb 3.2 oz (51.801 kg)  11/17/13 116 lb 6.4 oz (52.799 kg)  11/01/13 118 lb (53.524 kg)  10/27/13 112 lb 12.8 oz (51.166 kg)  07/29/13 109 lb 4.8 oz (49.578 kg)  07/08/13 113 lb 8 oz (51.483 kg)    Usual Body Weight: 110 lbs   % Usual Body Weight: 92%  BMI:  Body mass index is 16.39 kg/(m^2). Underweight   Estimated Nutritional Needs: Kcal: 1650-1850 Protein: 70-90g Fluid: 1.6-1.8L/day   Skin: Intact   Diet Order: General  EDUCATION NEEDS: -No education needs identified at this time   Intake/Output Summary (Last 24 hours) at 01/18/14 1655 Last data filed at 01/18/14 1242  Gross per 24 hour  Intake   1000 ml  Output     10 ml  Net    990 ml    Last BM: PTA  Labs:   Recent Labs Lab 01/18/14 0821  NA 136*  K 4.5  CL 96  CO2 28  BUN 57*  CREATININE 1.30  CALCIUM 12.8*  MG 2.2  PHOS 2.6  GLUCOSE 85    CBG (last 3)  No results found for this basename: GLUCAP,  in the last 72 hours  Scheduled Meds: . sodium chloride   Intravenous STAT  .  antiseptic oral rinse  15 mL Mouth Rinse q12n4p  . carvedilol  3.125 mg Oral BID WC  . chlorhexidine  15 mL Mouth Rinse BID  . heparin  5,000 Units Subcutaneous 3 times per day  . isosorbide dinitrate  10 mg Oral 3 times per day  . multivitamin with minerals  1 tablet Oral Daily  . ramipril  2.5 mg Oral Q1200    Continuous Infusions: . dextrose 5 % and 0.9% NaCl 50 mL/hr at 01/18/14 1420    Past Medical History  Diagnosis Date  . Hypertension   . Ischemic cardiomyopathy Sept 2011    EF improved to 35-40% 2D  . COPD (chronic obstructive pulmonary disease)   . LBBB (left bundle branch block)   . Acute anterior myocardial infarction 2011    s/p PCI to LAD; 2.25  x 12 mm BMS  . ICD (implantable cardiac defibrillator) in place   . Anginal pain   . Sleep apnea     "while sleeping last week" (11/12/2012)  . Stroke 2009    "sometimes left side goes numb/weak/gives out" (11/12/2012)  . Pacemaker   . Shortness of breath     Hx: of with exertion   . Headache(784.0)   . lung ca w/ mets to liver and bone dx'd 10/2012  . DNR (do not resuscitate)     Past Surgical History  Procedure Laterality Date  . Bi-ventricular implantable cardioverter defibrillator  (crt-d)  11/12/2012  . Coronary angioplasty with stent placement  02/12/2010    LAD BMS  . Insert / replace / remove pacemaker    . Portacath placement Right 02/24/2013    Procedure: INSERTION PORT-A-CATH;  Surgeon: Gaye Pollack, MD;  Location: [facility];  Service: Thoracic;  Laterality: Right;    Carlis Stable MS, [place], [place] Pager [phone] Weekend/After Hours Pager

## 2014-01-18 NOTE — H&P (Signed)
[facility] History and Physical  [name] ZOX:[id number] DOB: 08/25/1940 DOA: 01/18/2014  Referring physician: Dr. Darl Householder PCP: Lorretta Harp, MD   Chief Complaint: Weakness and poor oral intake.  HPI: [name] is a 73 y.o. male  73 year old with history of metastatic lung cancer, combined systolic and diastolic heart failure, and COPD who presented to the ED with complaints of progressive weakness and poor oral intake. Much of the history is obtained from ED physician and daughter at bedside as patient does not speaking much well reportedly. Daughter reports the patient has not been eating all that well in the last several days and has progressively gotten weaker. Given the persistence in symptoms and that it is getting worse patient was brought to the ED for further evaluation. No reported fevers.   Review of Systems:  Unable to accurately assess as patient is unable to discuss given his limited ability to speak [place]  Past Medical History  Diagnosis Date  . Hypertension   . Ischemic cardiomyopathy Sept 2011    EF improved to 35-40% 2D  . COPD (chronic obstructive pulmonary disease)   . LBBB (left bundle branch block)   . Acute anterior myocardial infarction 2011    s/p PCI to LAD; 2.25 x 12 mm BMS  . ICD (implantable cardiac defibrillator) in place   . Anginal pain   . Sleep apnea     "while sleeping last week" (11/12/2012)  . Stroke 2009    "sometimes left side goes numb/weak/gives out" (11/12/2012)  . Pacemaker   . Shortness of breath     Hx: of with exertion   . Headache(784.0)   . 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 Bi-ventricular implantable cardioverter defibrillator  (crt-d)  11/12/2012  . Coronary angioplasty with stent placement  02/12/2010    LAD BMS  . Insert / replace / remove pacemaker    . Portacath placement Right 02/24/2013    Procedure: INSERTION PORT-A-CATH;  Surgeon: Gaye Pollack,  MD;  Location: [facility] OR;  Service: Thoracic;  Laterality: Right;   Social History:  reports that he quit smoking about 4 years ago. His smoking use included Cigarettes. He has a 22.5 pack-year smoking history. He has never used smokeless tobacco. He reports that he does not drink alcohol or use illicit drugs.  Allergies  Allergen Reactions  . Oxycodone Nausea And Vomiting  . Tramadol Other (See Comments)    Felt light he was going to pass out    History reviewed. No pertinent family history.   Prior to Admission medications   Medication Sig Start Date End Date Taking? Authorizing Provider  carvedilol (COREG) 6.25 MG tablet Take 3.125 mg by mouth 2 (two) times daily with a meal.   Yes Historical Provider, MD  isosorbide dinitrate (ISORDIL) 10 MG tablet Take 10 mg by mouth every 8 (eight) hours.   Yes Historical Provider, MD  lidocaine-prilocaine (EMLA) cream Apply 1 application topically as needed (port access).    Yes Historical Provider, MD  morphine (MSIR) 15 MG tablet Take 15 mg by mouth every 4 (four) hours as needed for severe pain.   Yes Historical Provider, MD  ondansetron (ZOFRAN-ODT) 4 MG disintegrating tablet Take 4 mg by mouth every 6 (six) hours as needed for nausea or vomiting.   Yes Historical Provider, MD  ramipril (ALTACE) 2.5 MG capsule Take 2.5 mg by mouth daily at 12 noon.   Yes  Historical Provider, MD   Physical Exam: Filed Vitals:   01/18/14 1030  BP: 139/88  Pulse: 84  Temp:   Resp: 15    BP 139/88  Pulse 84  Temp(Src) 98.1 F (36.7 C) (Rectal)  Resp 15  SpO2 97%  General:  Appears calm and comfortable, cachectic Eyes: PERRL, normal lids, irises & conjunctiva ENT: Normal exterior appearance, no masses Neck: no LAD, masses or thyromegaly Cardiovascular: RRR, no m/r/g. No LE edema. Telemetry: SR, no arrhythmias  Respiratory: CTA bilaterally, no w/r/r. Normal respiratory effort. Abdomen: soft, nt, nd Skin: no rash or induration seen on limited  exam Musculoskeletal: grossly normal tone BUE/BLE Psychiatric: Unable to accurately assess given limited ability speaking English Neurologic: No facial asymmetry, moves extremities equally           Labs on Admission:  Basic Metabolic Panel:  Recent Labs Lab 01/18/14 0821  NA 136*  K 4.5  CL 96  CO2 28  GLUCOSE 85  BUN 57*  CREATININE 1.30  CALCIUM 12.8*   Liver Function Tests:  Recent Labs Lab 01/18/14 0821  AST 117*  ALT 66*  ALKPHOS 587*  BILITOT 1.5*  PROT 7.6  ALBUMIN 2.4*    Recent Labs Lab 01/18/14 0821  LIPASE 19   No results found for this basename: AMMONIA,  in the last 168 hours CBC:  Recent Labs Lab 01/18/14 0821  WBC 20.7*  NEUTROABS 18.1*  HGB 11.1*  HCT 34.0*  MCV 78.0  PLT 270   Cardiac Enzymes: No results found for this basename: CKTOTAL, CKMB, CKMBINDEX, TROPONINI,  in the last 168 hours  BNP (last 3 results) No results found for this basename: PROBNP,  in the last 8760 hours CBG: No results found for this basename: GLUCAP,  in the last 168 hours  Radiological Exams on Admission: US Abdomen Complete  01/18/2014   CLINICAL DATA:  Abdominal pain; history of metastatic disease to the liver, renal cysts, and abdominal aortic aneurysm  EXAM: ULTRASOUND ABDOMEN COMPLETE  COMPARISON:  CT scan of the abdomen and pelvis dated January 07, 2014.  FINDINGS: Gallbladder:  There is sludge within the gallbladder. Discrete stones are not demonstrated. The gallbladder wall thickness is top normal at 4.3 mm. There is no positive sonographic Murphy's sign.  Common bile duct:  Diameter: 3 mm  Liver:  The echotexture of the liver is heterogeneous diffusely with multiple masses of varying sizes demonstrated consistent with known metastatic disease. There is no intrahepatic ductal dilation.  IVC:  Limited visibility due to bowel gas.  Pancreas:  Limited visibility due to bowel gas.  Spleen:  The spleen is not enlarged.  Evaluation is limited due to bowel gas.   Right Kidney:  Length: 10.2 cm. Echogenicity within normal limits. No hydronephrosis visualized. A midpole cortical cyst measures 1.2 cm in greatest dimension.  Left Kidney:  Length: 9 cm. Echogenicity within normal limits. No hydronephrosis a visualized. There is a lower pole cyst measuring 1.7 cm in greatest dimension  Abdominal aorta:  There is a known infrarenal abdominal aortic aneurysm. It measures 3.4 cm in maximal diameter and is 5.6 cm in length. This has not increased in size since the previous CT study.  Other findings:  None.  IMPRESSION: 1. There is sludge within the gallbladder without evidence of acute cholecystitis. 2. Widespread hepatic metastatic disease is again demonstrated 3. No acute abnormality of the kidneys nor spleen or visualized portions of the pancreas are demonstrated. 4. There is a known infrarenal abdominal aortic  aneurysm.   Electronically Signed   By: David  [place]ique   On: 01/18/2014 09:14   Dg Abd Acute W/chest  01/18/2014   CLINICAL DATA:  LUNG CANCER WEAKNESS FATIGUE CONSTIPATION ABDOMINAL PAIN  EXAM: ACUTE ABDOMEN SERIES (ABDOMEN 2 VIEW & CHEST 1 VIEW)  COMPARISON:  Abdominal series 413 2015  FINDINGS: Lungs are clear. Cardiac silhouette within normal limits. Aorta is tortuous and mildly ectatic containing atherosclerotic calcifications. Stable right porta catheter and left AICD. No acute osseus abnormalities.  Air within nondilated loops of bowel. Residual contrast in the distal sigmoid colon. Stable pancreatic calcifications consistent with chronic pancreatitis left upper quadrant. No acute osseous abnormalities.  IMPRESSION: Negative abdominal radiographs.  No acute cardiopulmonary disease.   Electronically Signed   By: Margaree Mackintosh M.D.   On: 01/18/2014 09:21    Assessment/Plan Principal Problem:   Failure to thrive in adult - Most likely due to poor oral intake - RD consult - Liberalize diet, dysphagia 3 until speech therapy evaluates - PT evaluation  Active  Problems:   Chronic combined systolic and diastolic congestive heart failure - stable, continue home regimen    Lung cancer - Oncology consulted    Protein-calorie malnutrition, severe - Liberalizing diet - RD to evaluate  DVT prophylaxis - Heparin   Code Status: Per prior records patient DNR. Daughter at bedside was not sure Family Communication: discussed with daughter at bedside. Disposition Plan: Pending improvement in condition  Time spent: > 55 minutes  Velvet Bathe [facility] Pager [phone]  **Disclaimer: This note may have been dictated with voice recognition software. Similar sounding words can inadvertently be transcribed and this note may contain transcription errors which may not have been corrected upon publication of note.**

## 2014-01-18 NOTE — ED Notes (Signed)
Per daughter pt ready for medication

## 2014-01-18 NOTE — Progress Notes (Signed)
Pt arrived to unit at 1358 accompanied by daughter. Assessment completed. Pt and family oriented to unit/room.

## 2014-01-18 NOTE — Progress Notes (Signed)
UR completed 

## 2014-01-18 NOTE — ED Notes (Signed)
MD at bedside. EDP YAO

## 2014-01-18 NOTE — ED Notes (Signed)
Per [facility]_ Pt with HX of Lung Cancer, C/o of stomach pain, constipation x 4 days, stomach tender with slight distention. Denies N/V/D and fever. Fatigue with generalized weakness.

## 2014-01-18 NOTE — ED Provider Notes (Signed)
CSN: [id number]     Arrival date & time 01/18/14  0719 History   First MD Initiated Contact with Patient 01/18/14 [phone]     Chief Complaint  Patient presents with  . Lung Cancer  . Weakness  . Fatigue  . Constipation    x 4 days  . Abdominal Pain     (Consider location/radiation/quality/duration/timing/severity/associated sxs/prior Treatment) The history is provided by the patient.  [name] is a 73 y.o. male hx of HTN, ischemic cardiomyopathy, lung cancer with mets to liver and bone on chemo here with constipation, ab pain, weakness, failure to thrive. Has been constipated for the last 4 days. Also worsening epigastric pain. On CT 10 days ago he had worsening liver mets. No vomiting. Has been feeling weak all over and has no appetite. Is currently on chemo. Last chemo last week.    Past Medical History  Diagnosis Date  . Hypertension   . Ischemic cardiomyopathy Sept 2011    EF improved to 35-40% 2D  . COPD (chronic obstructive pulmonary disease)   . LBBB (left bundle branch block)   . Acute anterior myocardial infarction 2011    s/p PCI to LAD; 2.25 x 12 mm BMS  . ICD (implantable cardiac defibrillator) in place   . Anginal pain   . Sleep apnea     "while sleeping last week" (11/12/2012)  . Stroke 2009    "sometimes left side goes numb/weak/gives out" (11/12/2012)  . Pacemaker   . Shortness of breath     Hx: of with exertion   . Headache(784.0)   . 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 Bi-ventricular implantable cardioverter defibrillator  (crt-d)  11/12/2012  . Coronary angioplasty with stent placement  02/12/2010    LAD BMS  . Insert / replace / remove pacemaker    . Portacath placement Right 02/24/2013    Procedure: INSERTION PORT-A-CATH;  Surgeon: Gaye Pollack, MD;  Location: [facility];  Service: Thoracic;  Laterality: Right;   No family history on file. History  Substance Use Topics  . Smoking status:  Former Smoker -- 0.50 packs/day for 45 years    Types: Cigarettes    Quit date: 03/13/2009  . Smokeless tobacco: Never Used  . Alcohol Use: No     Comment: 11/12/2012 "last alcohol 2008; never had problem w/it"    Review of Systems  Gastrointestinal: Positive for abdominal pain and constipation.  Neurological: Positive for weakness.  All other systems reviewed and are negative.     Allergies  Oxycodone and Tramadol  Home Medications   Prior to Admission medications   Medication Sig Start Date End Date Taking? Authorizing Provider  carvedilol (COREG) 6.25 MG tablet Take 3.125 mg by mouth 2 (two) times daily with a meal.   Yes Historical Provider, MD  isosorbide dinitrate (ISORDIL) 10 MG tablet Take 10 mg by mouth every 8 (eight) hours.   Yes Historical Provider, MD  lidocaine-prilocaine (EMLA) cream Apply 1 application topically as needed (port access).    Yes Historical Provider, MD  morphine (MSIR) 15 MG tablet Take 15 mg by mouth every 4 (four) hours as needed for severe pain.   Yes Historical Provider, MD  ondansetron (ZOFRAN-ODT) 4 MG disintegrating tablet Take 4 mg by mouth every 6 (six) hours as needed for nausea or vomiting.   Yes Historical Provider, MD  ramipril (ALTACE) 2.5 MG capsule Take 2.5 mg by mouth  daily at 12 noon.   Yes Historical Provider, MD   BP 153/99  Pulse 98  Temp(Src) 98.1 F (36.7 C) (Rectal)  Resp 20  SpO2 94% Physical Exam  Nursing note and vitals reviewed. Constitutional: He is oriented to person, place, and time.  Chronically ill, dehydrated. Emaciated   HENT:  Head: Normocephalic.  MM dry   Eyes: Conjunctivae and EOM are normal. Pupils are equal, round, and reactive to light.  Neck: Normal range of motion. Neck supple.  Cardiovascular: Normal rate, regular rhythm and normal heart sounds.   Pulmonary/Chest: Effort normal.  ? Crackles L base   Abdominal:  Firm, especially in epigastrium. Mild diffuse tenderness, worse in LLQ    Genitourinary:  No stool impaction   Musculoskeletal: Normal range of motion. He exhibits no edema and no tenderness.  Neurological: He is alert and oriented to person, place, and time. No cranial nerve deficit. Coordination normal.  Skin: Skin is warm and dry.  Psychiatric: He has a normal mood and affect. His behavior is normal. Judgment and thought content normal.    ED Course  Procedures (including critical care time) Labs Review Labs Reviewed  CBC WITH DIFFERENTIAL - Abnormal; Notable for the following:    WBC 20.7 (*)    Hemoglobin 11.1 (*)    HCT 34.0 (*)    MCH 25.5 (*)    RDW 18.9 (*)    Neutrophils Relative % 87 (*)    Neutro Abs 18.1 (*)    Lymphocytes Relative 5 (*)    Monocytes Absolute 1.6 (*)    All other components within normal limits  COMPREHENSIVE METABOLIC PANEL - Abnormal; Notable for the following:    Sodium 136 (*)    BUN 57 (*)    Calcium 12.8 (*)    Albumin 2.4 (*)    AST 117 (*)    ALT 66 (*)    Alkaline Phosphatase 587 (*)    Total Bilirubin 1.5 (*)    GFR calc non Af Amer 52 (*)    GFR calc Af Amer 61 (*)    All other components within normal limits  URINE CULTURE  LIPASE, BLOOD  URINALYSIS, ROUTINE W REFLEX MICROSCOPIC  POC OCCULT BLOOD, ED    Imaging Review US Abdomen Complete  01/18/2014   CLINICAL DATA:  Abdominal pain; history of metastatic disease to the liver, renal cysts, and abdominal aortic aneurysm  EXAM: ULTRASOUND ABDOMEN COMPLETE  COMPARISON:  CT scan of the abdomen and pelvis dated January 07, 2014.  FINDINGS: Gallbladder:  There is sludge within the gallbladder. Discrete stones are not demonstrated. The gallbladder wall thickness is top normal at 4.3 mm. There is no positive sonographic Murphy's sign.  Common bile duct:  Diameter: 3 mm  Liver:  The echotexture of the liver is heterogeneous diffusely with multiple masses of varying sizes demonstrated consistent with known metastatic disease. There is no intrahepatic ductal dilation.   IVC:  Limited visibility due to bowel gas.  Pancreas:  Limited visibility due to bowel gas.  Spleen:  The spleen is not enlarged.  Evaluation is limited due to bowel gas.  Right Kidney:  Length: 10.2 cm. Echogenicity within normal limits. No hydronephrosis visualized. A midpole cortical cyst measures 1.2 cm in greatest dimension.  Left Kidney:  Length: 9 cm. Echogenicity within normal limits. No hydronephrosis a visualized. There is a lower pole cyst measuring 1.7 cm in greatest dimension  Abdominal aorta:  There is a known infrarenal abdominal aortic aneurysm. It measures  3.4 cm in maximal diameter and is 5.6 cm in length. This has not increased in size since the previous CT study.  Other findings:  None.  IMPRESSION: 1. There is sludge within the gallbladder without evidence of acute cholecystitis. 2. Widespread hepatic metastatic disease is again demonstrated 3. No acute abnormality of the kidneys nor spleen or visualized portions of the pancreas are demonstrated. 4. There is a known infrarenal abdominal aortic aneurysm.   Electronically Signed   By: David  Martinique   On: 01/18/2014 09:14   Dg Abd Acute W/chest  01/18/2014   CLINICAL DATA:  LUNG CANCER WEAKNESS FATIGUE CONSTIPATION ABDOMINAL PAIN  EXAM: ACUTE ABDOMEN SERIES (ABDOMEN 2 VIEW & CHEST 1 VIEW)  COMPARISON:  Abdominal series 413 2015  FINDINGS: Lungs are clear. Cardiac silhouette within normal limits. Aorta is tortuous and mildly ectatic containing atherosclerotic calcifications. Stable right porta catheter and left AICD. No acute osseus abnormalities.  Air within nondilated loops of bowel. Residual contrast in the distal sigmoid colon. Stable pancreatic calcifications consistent with chronic pancreatitis left upper quadrant. No acute osseous abnormalities.  IMPRESSION: Negative abdominal radiographs.  No acute cardiopulmonary disease.   Electronically Signed   By: Margaree Mackintosh M.D.   On: 01/18/2014 09:21     EKG Interpretation None       MDM   Final diagnoses:  None    [name] is a 73 y.o. male here with weakness, failure to thrive, ab pain. Appears dehydrated. Will check labs. Will hydrate. Had recent CT that showed worsening liver mets with cirrhosis and AAA. Will do US abdomen.   10:00 AM  WBC 20, CXR nl. Staff cathed him for UA but nothing came out. Likely very dehydrated. LFTs continue to climb, bili 1.5. US showed no obvious cholecystitis, increasing liver mets. Will admit for dehydration, failure to thrive.     Wandra Arthurs, MD 01/18/14 1007

## 2014-01-18 NOTE — ED Notes (Signed)
Amber transferred pt. Belongings excepts for sweat pants taken with daughter

## 2014-01-18 NOTE — ED Notes (Signed)
MD at bedside. 

## 2014-01-19 ENCOUNTER — Ambulatory Visit: Payer: Medicare PPO

## 2014-01-19 ENCOUNTER — Other Ambulatory Visit: Payer: Medicare PPO

## 2014-01-19 DIAGNOSIS — R630 Anorexia: Secondary | ICD-10-CM

## 2014-01-19 DIAGNOSIS — R1310 Dysphagia, unspecified: Secondary | ICD-10-CM

## 2014-01-19 DIAGNOSIS — D638 Anemia in other chronic diseases classified elsewhere: Secondary | ICD-10-CM

## 2014-01-19 DIAGNOSIS — R627 Adult failure to thrive: Secondary | ICD-10-CM

## 2014-01-19 DIAGNOSIS — E43 Unspecified severe protein-calorie malnutrition: Secondary | ICD-10-CM

## 2014-01-19 LAB — BASIC METABOLIC PANEL
BUN: 58 mg/dL — ABNORMAL HIGH (ref 6–23)
CO2: 26 mEq/L (ref 19–32)
Calcium: 12.2 mg/dL — ABNORMAL HIGH (ref 8.4–10.5)
Chloride: 101 mEq/L (ref 96–112)
Creatinine, Ser: 1.43 mg/dL — ABNORMAL HIGH (ref 0.50–1.35)
GFR, EST AFRICAN AMERICAN: 54 mL/min — AB (ref >90)
GFR, EST NON AFRICAN AMERICAN: 47 mL/min — AB (ref >90)
Glucose, Bld: 93 mg/dL (ref 70–99)
POTASSIUM: 5.1 meq/L (ref 3.7–5.3)
SODIUM: 138 meq/L (ref 137–147)

## 2014-01-19 LAB — CBC
HCT: 29.6 % — ABNORMAL LOW (ref 39.0–52.0)
Hemoglobin: 9.4 g/dL — ABNORMAL LOW (ref 13.0–17.0)
MCH: 24.7 pg — ABNORMAL LOW (ref 26.0–34.0)
MCHC: 31.8 g/dL (ref 30.0–36.0)
MCV: 77.9 fL — AB (ref 78.0–100.0)
PLATELETS: 238 10*3/uL (ref 150–400)
RBC: 3.8 MIL/uL — AB (ref 4.22–5.81)
RDW: 19.1 % — AB (ref 11.5–15.5)
WBC: 17.1 10*3/uL — ABNORMAL HIGH (ref 4.0–10.5)

## 2014-01-19 LAB — URINE CULTURE
Colony Count: NO GROWTH
Culture: NO GROWTH

## 2014-01-19 MED ORDER — DOCUSATE SODIUM 100 MG PO CAPS
100.0000 mg | ORAL_CAPSULE | Freq: Two times a day (BID) | ORAL | Status: DC
  Administered 2014-01-19 – 2014-01-21 (×5): 100 mg via ORAL
  Filled 2014-01-19 (×8): qty 1

## 2014-01-19 MED ORDER — POLYETHYLENE GLYCOL 3350 17 G PO PACK
17.0000 g | PACK | Freq: Every day | ORAL | Status: DC
  Administered 2014-01-19 – 2014-01-21 (×2): 17 g via ORAL
  Filled 2014-01-19 (×4): qty 1

## 2014-01-19 NOTE — Progress Notes (Signed)
PT Cancellation Note  Patient Details Name: [name] MRN: [id number] DOB: 08-20-1940   Cancelled Treatment:    Reason Eval/Treat Not Completed: Medical issues which prohibited therapy (Charge RN reports Palliative care consult pending, pt has mets. will check on 7/2.)   Claretha Cooper 01/19/2014, 12:05 PM Tresa Endo PT [phone]

## 2014-01-19 NOTE — Evaluation (Signed)
Clinical/Bedside Swallow Evaluation Patient Details  Name: [name] MRN: [id number] Date of Birth: 1940-11-28  Today's Date: 01/19/2014 Time: 1601-0932 SLP Time Calculation (min): 30 min  Past Medical History:  Past Medical History  Diagnosis Date  . Hypertension   . Ischemic cardiomyopathy Sept 2011    EF improved to 35-40% 2D  . COPD (chronic obstructive pulmonary disease)   . LBBB (left bundle branch block)   . Acute anterior myocardial infarction 2011    s/p PCI to LAD; 2.25 x 12 mm BMS  . ICD (implantable cardiac defibrillator) in place   . Anginal pain   . Sleep apnea     "while sleeping last week" (11/12/2012)  . Stroke 2009    "sometimes left side goes numb/weak/gives out" (11/12/2012)  . Pacemaker   . Shortness of breath     Hx: of with exertion   . Headache(784.0)   . lung ca w/ mets to liver and bone dx'd 10/2012  . DNR (do not resuscitate)    Past Surgical History:  Past Surgical History  Procedure Laterality Date  . Bi-ventricular implantable cardioverter defibrillator  (crt-d)  11/12/2012  . Coronary angioplasty with stent placement  02/12/2010    LAD BMS  . Insert / replace / remove pacemaker    . Portacath placement Right 02/24/2013    Procedure: INSERTION PORT-A-CATH;  Surgeon: Gaye Pollack, MD;  Location: [facility];  Service: Thoracic;  Laterality: Right;   HPI:  73 yo pt with history of metastatic lung cancer, combined systolic and diastolic heart failure, and COPD who presented to the ED with complaints of progressive weakness and poor oral intake. Pt speaks [place], used [employer] to talk to pt. Pt reported to RD that he has problems swallowing and MD ordered swallow evaluation.     Assessment / Plan / Recommendation Clinical Impression  Pt is weak but does not have focal cranial nerve deficits.  He describes his dysphagia as having pain after eating pointing to lower left abdomen.  SLP observed pt consume water, pill with water *from RN,  and small bite of graham cracker without indication of airway compromise or aspiration.  Poor mastication attempt with cracker and pt used Ensure to transit into pharynx - suspect this is due to pt's gross weakness.  Delayed swallow noted with pt wincing with each swallow but he adamantly denies pain or issues with swallowing po observed.  Belching observed after swallow but pt denies reflux/regurgitation.   Recommend soft/ground meat/thin with general precautions, do not suspect pt's reported dysphagia symptoms are due to significant oropharyngeal defictis.  Pt pointed to lower abdomen to indicate pain after consumption of a few boluses.  Educated pt to precautions/compensation strategies to mitigate risk.  Anticipate pt's intake to be poor.      Aspiration Risk  Mild    Diet Recommendation Dysphagia 3 (Mechanical Soft);Thin liquid   Liquid Administration via: Cup;Straw Medication Administration:  (as tolerated) Supervision: Full supervision/cueing for compensatory strategies Compensations: Slow rate;Small sips/bites Postural Changes and/or Swallow Maneuvers: Seated upright 90 degrees;Upright 30-60 min after meal    Other  Recommendations Oral Care Recommendations: Oral care BID   Follow Up Recommendations     n/a  Frequency and Duration        Pertinent Vitals/Pain Afebrile, decreased     Swallow Study Prior Functional Status   see hhx    General Date of Onset: 01/19/14 HPI: 73 yo pt with history of metastatic lung cancer, combined systolic and  diastolic heart failure, and COPD who presented to the ED with complaints of progressive weakness and poor oral intake. Pt speaks [place], used [employer] to talk to pt. Pt reported to RD that he has problems swallowing and MD ordered swallow evaluation.   Type of Study: Bedside swallow evaluation Diet Prior to this Study: Regular;Thin liquids Temperature Spikes Noted: No Respiratory Status: Room air History of Recent  Intubation: No Behavior/Cognition: Alert;Distractible (decreased responses suspect due to fatigue) Oral Cavity - Dentition: Poor condition Self-Feeding Abilities: Needs assist Patient Positioning: Upright in bed Baseline Vocal Quality: Low vocal intensity Volitional Cough: Weak Volitional Swallow: Unable to elicit    Oral/Motor/Sensory Function Overall Oral Motor/Sensory Function:  (gross weakness but no focal deficits)   Ice Chips Ice chips: Not tested   Thin Liquid Thin Liquid: Within functional limits Oral Phase Functional Implications: Prolonged oral transit Other Comments: delayed oral transit and swallow    Nectar Thick Nectar Thick Liquid: Not tested   Honey Thick Honey Thick Liquid: Not tested   Puree Puree: Not tested   Solid   GO    Solid: Impaired Oral Phase Impairments: Impaired mastication Other Comments: pt used Ensure to transit small piece of graham cracker into pharynx        Luanna Salk, [facility] SLP [phone]

## 2014-01-19 NOTE — Progress Notes (Signed)
[facility] PROGRESS NOTE  [name] KGU:[id number] DOB: 02-Oct-1940 DOA: 01/18/2014 PCP: Lorretta Harp, MD  Assessment/Plan: Adult FTT -Related to his metastatic NSCLC. -No appetite, also has pain in abdomen while eating. -Has been seen by ST with recommendations for a dysphagia III diet. -Discussed with Dr. Earlie Server, and we are in agreement with a a palliative care evaluation (not sure how much patient and wife understood this, despite use of translator phone).  Severe Protein Caloric Malnutrition -Ensure TID. -Appreciate dietitian input.  Stage IV Lung Cancer -Consider palliation. -Follows with Dr. Julien Nordmann in the OP setting.  Code Status: DNR Family Communication: Wife and young granddaughter at bedside.  Disposition Plan: To be determined   Consultants:  Palliative Care  Oncology   Antibiotics:  None   Subjective: Feels week, fatigued.  Objective: Filed Vitals:   01/18/14 1418 01/18/14 2125 01/19/14 0604 01/19/14 0746  BP: 156/96 131/81 118/67 88/50  Pulse: 91 100 102 102  Temp: 97.4 F (36.3 C) 97.5 F (36.4 C) 98.9 F (37.2 C) 98.4 F (36.9 C)  TempSrc: Oral Oral  Oral  Resp: 20 18 18 20   Height: 5\' 6"  (1.676 m)     Weight: 46.04 kg (101 lb 8 oz)     SpO2: 97% 94% 95% 90%    Intake/Output Summary (Last 24 hours) at 01/19/14 1034 Last data filed at 01/19/14 0820  Gross per 24 hour  Intake   1240 ml  Output     10 ml  Net   1230 ml   Filed Weights   01/18/14 1418  Weight: 46.04 kg (101 lb 8 oz)    Exam:   General:  AA Ox3  Cardiovascular: RRR  Respiratory: CTA B  Abdomen: S/ND/+BS  Extremities: no C/C/E   Neurologic:  Moves all 4 spontaneously  Data Reviewed: Basic Metabolic Panel:  Recent Labs Lab 01/18/14 0821 01/19/14 0447  NA 136* 138  K 4.5 5.1  CL 96 101  CO2 28 26  GLUCOSE 85 93  BUN 57* 58*  CREATININE 1.30 1.43*  CALCIUM 12.8* 12.2*  MG 2.2  --   PHOS 2.6  --    Liver Function  Tests:  Recent Labs Lab 01/18/14 0821  AST 117*  ALT 66*  ALKPHOS 587*  BILITOT 1.5*  PROT 7.6  ALBUMIN 2.4*    Recent Labs Lab 01/18/14 0821  LIPASE 19   No results found for this basename: AMMONIA,  in the last 168 hours CBC:  Recent Labs Lab 01/18/14 0821 01/19/14 0447  WBC 20.7* 17.1*  NEUTROABS 18.1*  --   HGB 11.1* 9.4*  HCT 34.0* 29.6*  MCV 78.0 77.9*  PLT 270 238   Cardiac Enzymes: No results found for this basename: CKTOTAL, CKMB, CKMBINDEX, TROPONINI,  in the last 168 hours BNP (last 3 results) No results found for this basename: PROBNP,  in the last 8760 hours CBG: No results found for this basename: GLUCAP,  in the last 168 hours  No results found for this or any previous visit (from the past 240 hour(s)).   Studies: US Abdomen Complete  01/18/2014   CLINICAL DATA:  Abdominal pain; history of metastatic disease to the liver, renal cysts, and abdominal aortic aneurysm  EXAM: ULTRASOUND ABDOMEN COMPLETE  COMPARISON:  CT scan of the abdomen and pelvis dated January 07, 2014.  FINDINGS: Gallbladder:  There is sludge within the gallbladder. Discrete stones are not demonstrated. The gallbladder wall thickness is top normal at 4.3 mm. There is  no positive sonographic Murphy's sign.  Common bile duct:  Diameter: 3 mm  Liver:  The echotexture of the liver is heterogeneous diffusely with multiple masses of varying sizes demonstrated consistent with known metastatic disease. There is no intrahepatic ductal dilation.  IVC:  Limited visibility due to bowel gas.  Pancreas:  Limited visibility due to bowel gas.  Spleen:  The spleen is not enlarged.  Evaluation is limited due to bowel gas.  Right Kidney:  Length: 10.2 cm. Echogenicity within normal limits. No hydronephrosis visualized. A midpole cortical cyst measures 1.2 cm in greatest dimension.  Left Kidney:  Length: 9 cm. Echogenicity within normal limits. No hydronephrosis a visualized. There is a lower pole cyst measuring  1.7 cm in greatest dimension  Abdominal aorta:  There is a known infrarenal abdominal aortic aneurysm. It measures 3.4 cm in maximal diameter and is 5.6 cm in length. This has not increased in size since the previous CT study.  Other findings:  None.  IMPRESSION: 1. There is sludge within the gallbladder without evidence of acute cholecystitis. 2. Widespread hepatic metastatic disease is again demonstrated 3. No acute abnormality of the kidneys nor spleen or visualized portions of the pancreas are demonstrated. 4. There is a known infrarenal abdominal aortic aneurysm.   Electronically Signed   By: David  Martinique   On: 01/18/2014 09:14   Dg Abd Acute W/chest  01/18/2014   CLINICAL DATA:  LUNG CANCER WEAKNESS FATIGUE CONSTIPATION ABDOMINAL PAIN  EXAM: ACUTE ABDOMEN SERIES (ABDOMEN 2 VIEW & CHEST 1 VIEW)  COMPARISON:  Abdominal series 413 2015  FINDINGS: Lungs are clear. Cardiac silhouette within normal limits. Aorta is tortuous and mildly ectatic containing atherosclerotic calcifications. Stable right porta catheter and left AICD. No acute osseus abnormalities.  Air within nondilated loops of bowel. Residual contrast in the distal sigmoid colon. Stable pancreatic calcifications consistent with chronic pancreatitis left upper quadrant. No acute osseous abnormalities.  IMPRESSION: Negative abdominal radiographs.  No acute cardiopulmonary disease.   Electronically Signed   By: Margaree Mackintosh M.D.   On: 01/18/2014 09:21    Scheduled Meds: . sodium chloride   Intravenous STAT  . antiseptic oral rinse  15 mL Mouth Rinse q12n4p  . carvedilol  3.125 mg Oral BID WC  . chlorhexidine  15 mL Mouth Rinse BID  . feeding supplement (ENSURE COMPLETE)  237 mL Oral TID BM  . heparin  5,000 Units Subcutaneous 3 times per day  . isosorbide dinitrate  10 mg Oral 3 times per day  . multivitamin with minerals  1 tablet Oral Daily  . ramipril  2.5 mg Oral Q1200   Continuous Infusions: . dextrose 5 % and 0.9% NaCl 50 mL/hr at  01/18/14 1420    Principal Problem:   Failure to thrive in adult Active Problems:   Lung cancer   Protein-calorie malnutrition, severe   Chronic combined systolic and diastolic CHF, NYHA class 1   Dysphagia, unspecified(787.20)    Time spent: 35 minutes. Greater than 50% of this time was spent in direct contact with the patient coordinating care.    Lelon Frohlich  [facility] Pager [phone]  If 7PM-7AM, please contact night-coverage at [email or web address], password [facility] 01/19/2014, 10:34 AM  LOS: 1 day

## 2014-01-19 NOTE — Progress Notes (Signed)
Thank you for consulting the Palliative Medicine Team at [facility] to meet your patient's and family's needs.   The reason that you asked [place] to see your patient is  For Clarification of GOC and options  We have scheduled your patient for a meeting: Family is trying to gather early tomorrow morning to meet with PMT  Other family members that need to be present:  All family will be present.  Culturally family makes decisions as a whole  Your patient is able/unable to participate: Yes he is able and will utilize telephone interpretor   Additional Narrative: Family to call after five with available time for am   Wadie Lessen NP  Palliative Medicine Team Team Phone # [phone] Pager [phone]  Discussed with Dr Alden Hipp  Phone #  [phone] Lowella Dell (daughter)             # [phone]   Nep (son)

## 2014-01-20 ENCOUNTER — Ambulatory Visit: Payer: Medicare PPO

## 2014-01-20 ENCOUNTER — Ambulatory Visit: Payer: Medicare PPO | Admitting: Physician Assistant

## 2014-01-20 ENCOUNTER — Other Ambulatory Visit: Payer: Medicare PPO

## 2014-01-20 DIAGNOSIS — I509 Heart failure, unspecified: Secondary | ICD-10-CM

## 2014-01-20 DIAGNOSIS — R1310 Dysphagia, unspecified: Secondary | ICD-10-CM

## 2014-01-20 DIAGNOSIS — D638 Anemia in other chronic diseases classified elsewhere: Secondary | ICD-10-CM

## 2014-01-20 DIAGNOSIS — Z515 Encounter for palliative care: Secondary | ICD-10-CM

## 2014-01-20 DIAGNOSIS — I5041 Acute combined systolic (congestive) and diastolic (congestive) heart failure: Secondary | ICD-10-CM

## 2014-01-20 DIAGNOSIS — R630 Anorexia: Secondary | ICD-10-CM

## 2014-01-20 MED ORDER — LORAZEPAM 1 MG PO TABS: 1.0000 mg | ORAL_TABLET | ORAL | Status: DC | PRN

## 2014-01-20 MED ORDER — MORPHINE SULFATE (CONCENTRATE) 10 MG /0.5 ML PO SOLN
5.0000 mg | ORAL | Status: DC | PRN
  Administered 2014-01-20 – 2014-01-21 (×2): 5 mg via ORAL
  Filled 2014-01-20 (×2): qty 0.5

## 2014-01-20 NOTE — Consult Note (Signed)
Patient FV:[facility]      DOB: [birth year]      QPR:[id number]     Consult Note from the Palliative Medicine Team at [facility] Requested by: Dr. Jerilee Hoh    PCP: Lorretta Harp, MD Reason for Consultation: [place] and options.     Phone Number:[phone]  Assessment of patients Current state: Mr. Critzer is a 73 yo male with metastatic stage IV NSCLC, severe protein calorie malnutrition, failure to thrive. No further options to treat his cancer. PMH significant for ischemic cardiomyopathy (EF 35-40%), MI, AICD (need to speak with family about deactivation), stroke.   I have met with Mr. Wessler and his wife and daughter at bedside. Daughter speaks English very well but his wife only speaks [place] Interpretor service did not have anyone to translate this language so daughter was used as Veterinary surgeon as we had no other options available to [place] at this time. We discussed his very poor prognosis with his metastatic NSCLC. We discussed that he is so very weak and malnourished that no further treatment is available for his cancer (confirmed with Dr. Earlie Server yesterday). We discussed that what we can do is keep him comfortable and allow for a peaceful death. They are open to hospice. Wife and children work full time and are unable to provide care at home for him even with the support of hospice. We discussed hospice facility and they are open to this option - they wanted to make sure that this was not like a nursing home. I discussed that hospice will be giving him what he needs to keep him comfortable but will not be pursuing lab work, diagnostic testing, antibiotics, IV fluids, etc. Family seems to understand and agrees with comfort approach. Son will be here tomorrow morning and wife prefers to deliver this news to him in person. They agree to have hospice speak with them and make sure there is a bed available for transfer after they speak with son - I agree to meet with them again tomorrow at 0800 am.  Prognosis is very poor with severe malnutrition and weight loss, they say he sleeps at least 70% of the time, and ongoing abdominal pain - I suspect he has <3 weeks and I communicated this to his family.     Goals of Care: 1.  Code Status: DNR   2. Scope of Treatment: 1. Vital Signs: routine 2. AICD: **Need to assess to make sure this is deactivated** 3. Respiratory/Oxygen: yes for comfort 4. Nutritional Support/Tube Feeds: no 5. Antibiotics: no 6. IVF: for now 7. Review of Medications to be discontinued: minimize for comfort 8. Labs: no 9. Telemetry: no   4. Disposition: Hopeful for hospice facility.    3. Symptom Management:   1. Pain: Roxanol 5 mg every 1 hour as needed.  2. Anxiety/Sleep: Ativan 1 mg po every 4 hours prn.  3. Bowel Regimen: Colace and miralax scheduled.  4. Nausea/Vomiting: Ondansetron prn.   4. Psychosocial: Emotional support provided to family.    Brief HPI: 73 yo male with metastatic NSCLC at end of life.    ROS: + abdominal pain, denies nausea/consipation/anxiety/sleep disturbance    PMH:  Past Medical History  Diagnosis Date  . Hypertension   . Ischemic cardiomyopathy Sept 2011    EF improved to 35-40% 2D  . COPD (chronic obstructive pulmonary disease)   . LBBB (left bundle branch block)   . Acute anterior myocardial infarction 2011    s/p PCI to LAD;  2.25 x 12 mm BMS  . ICD (implantable cardiac defibrillator) in place   . Anginal pain   . Sleep apnea     "while sleeping last week" (11/12/2012)  . Stroke 2009    "sometimes left side goes numb/weak/gives out" (11/12/2012)  . Pacemaker   . Shortness of breath     Hx: of with exertion   . Headache(784.0)   . lung ca w/ mets to liver and bone dx'd 10/2012  . DNR (do not resuscitate)      PSH: Past Surgical History  Procedure Laterality Date  . Bi-ventricular implantable cardioverter defibrillator  (crt-d)  11/12/2012  . Coronary angioplasty with stent placement  02/12/2010    LAD  BMS  . Insert / replace / remove pacemaker    . Portacath placement Right 02/24/2013    Procedure: INSERTION PORT-A-CATH;  Surgeon: Gaye Pollack, MD;  Location: [facility] OR;  Service: Thoracic;  Laterality: Right;   I have reviewed the [place] and SH and  If appropriate update it with new information. Allergies  Allergen Reactions  . Oxycodone Nausea And Vomiting  . Tramadol Other (See Comments)    Felt light he was going to pass out   Scheduled Meds: . antiseptic oral rinse  15 mL Mouth Rinse q12n4p  . carvedilol  3.125 mg Oral BID WC  . chlorhexidine  15 mL Mouth Rinse BID  . docusate sodium  100 mg Oral BID  . feeding supplement (ENSURE COMPLETE)  237 mL Oral TID BM  . heparin  5,000 Units Subcutaneous 3 times per day  . isosorbide dinitrate  10 mg Oral 3 times per day  . multivitamin with minerals  1 tablet Oral Daily  . polyethylene glycol  17 g Oral Daily  . ramipril  2.5 mg Oral Q1200   Continuous Infusions: . dextrose 5 % and 0.9% NaCl 75 mL/hr (01/19/14 1633)   PRN Meds:.lidocaine-prilocaine, morphine, ondansetron (ZOFRAN) IV, ondansetron    BP 92/53  Pulse 93  Temp(Src) 98.3 F (36.8 C) (Oral)  Resp 16  Ht _0  (1.676 m)  Wt 46.04 kg (101 lb 8 oz)  BMI 16.39 kg/m2  SpO2 90%   PPS: 20% at best   Intake/Output Summary (Last 24 hours) at 01/20/14 0936 Last data filed at 01/19/14 1300  Gross per 24 hour  Intake    100 ml  Output      0 ml  Net    100 ml   LBM: prior to admission                         Physical Exam:  General: Lethargic, frail, cachectic, ill appearing, NAD HEENT: Severe muscle wasting, no JVD, moist oral mucosa Chest: CTA throughout, no labored breathing, symmetric CVS: RRR, S1 S2 Abdomen: Soft, ND, tender to palpation, hypoactive BS Ext: MAE, no edema, warm to touch Neuro: Lethargic, follows some commands, unable to assess orientation d/t lethargy  Labs: CBC    Component Value Date/Time   WBC 17.1* 01/19/2014 0447   WBC 11.6* 12/29/2013  1012   WBC 6.3 11/01/2013 1258   RBC 3.80* 01/19/2014 0447   RBC 3.69* 12/29/2013 1012   RBC 3.63* 11/01/2013 1258   HGB 9.4* 01/19/2014 0447   HGB 8.9* 12/29/2013 1012   HGB 9.0* 11/01/2013 1258   HCT 29.6* 01/19/2014 0447   HCT 28.3* 12/29/2013 1012   HCT 29.7* 11/01/2013 1258   PLT 238 01/19/2014 0447   PLT  266 12/29/2013 1012   MCV 77.9* 01/19/2014 0447   MCV 76.7* 12/29/2013 1012   MCV 81.7 11/01/2013 1258   MCH 24.7* 01/19/2014 0447   MCH 24.1* 12/29/2013 1012   MCH 24.8* 11/01/2013 1258   MCHC 31.8 01/19/2014 0447   MCHC 31.4* 12/29/2013 1012   MCHC 30.3* 11/01/2013 1258   RDW 19.1* 01/19/2014 0447   RDW 16.4* 12/29/2013 1012   LYMPHSABS 0.9 01/18/2014 0821   LYMPHSABS 1.0 12/29/2013 1012   MONOABS 1.6* 01/18/2014 0821   MONOABS 1.2* 12/29/2013 1012   EOSABS 0.0 01/18/2014 0821   EOSABS 0.1 12/29/2013 1012   BASOSABS 0.0 01/18/2014 0821   BASOSABS 0.0 12/29/2013 1012    BMET    Component Value Date/Time   NA 138 01/19/2014 0447   NA 136 12/29/2013 1012   K 5.1 01/19/2014 0447   K 4.3 12/29/2013 1012   CL 101 01/19/2014 0447   CO2 26 01/19/2014 0447   CO2 24 12/29/2013 1012   GLUCOSE 93 01/19/2014 0447   GLUCOSE 112 12/29/2013 1012   BUN 58* 01/19/2014 0447   BUN 38.5* 12/29/2013 1012   CREATININE 1.43* 01/19/2014 0447   CREATININE 1.9* 12/29/2013 1012   CALCIUM 12.2* 01/19/2014 0447   CALCIUM 9.8 12/29/2013 1012   GFRNONAA 47* 01/19/2014 0447   GFRAA 54* 01/19/2014 0447    CMP     Component Value Date/Time   NA 138 01/19/2014 0447   NA 136 12/29/2013 1012   K 5.1 01/19/2014 0447   K 4.3 12/29/2013 1012   CL 101 01/19/2014 0447   CO2 26 01/19/2014 0447   CO2 24 12/29/2013 1012   GLUCOSE 93 01/19/2014 0447   GLUCOSE 112 12/29/2013 1012   BUN 58* 01/19/2014 0447   BUN 38.5* 12/29/2013 1012   CREATININE 1.43* 01/19/2014 0447   CREATININE 1.9* 12/29/2013 1012   CALCIUM 12.2* 01/19/2014 0447   CALCIUM 9.8 12/29/2013 1012   PROT 7.6 01/18/2014 0821   PROT 7.6 12/29/2013 1012   ALBUMIN 2.4* 01/18/2014 0821   ALBUMIN 2.5* 12/29/2013  1012   AST 117* 01/18/2014 0821   AST 63* 12/29/2013 1012   ALT 66* 01/18/2014 0821   ALT 34 12/29/2013 1012   ALKPHOS 587* 01/18/2014 0821   ALKPHOS 436* 12/29/2013 1012   BILITOT 1.5* 01/18/2014 0821   BILITOT 0.92 12/29/2013 1012   GFRNONAA 47* 01/19/2014 0447   GFRAA 54* 01/19/2014 0447     Time In Time Out Total Time Spent with Patient Total Overall Time  0855 0955 45mn 659m    Greater than 50%  of this time was spent counseling and coordinating care related to the above assessment and plan.  AlVinie SillNP Palliative Medicine Team Pager # [phone]-F 8a-5p) Team Phone # [phone]/Weekends)

## 2014-01-20 NOTE — Progress Notes (Signed)
Clinical Social Work  Per Therapist, sports, family had additional questions about DC planning. CSW met with dtr Erma [phone]) who reports that family feels patient will be more comfortable at home. Family reports another family member had a RN that would stay with them for 12 hours a day and they patient to return home. CSW explained that hospice does not usually stay for extended amounts of time but family reports if RN is unable to stay then family would stay with patient. Dtr agreeable to talk with family about options and to keep [facility] as an option until family has made a decision. Dtr will be at hospital tomorrow morning but has to work at 10:30. CSW will follow up in the morning with family.  [place], [place] [phone]

## 2014-01-20 NOTE — Progress Notes (Signed)
Full note to follow:  I have met with Mr. Wortmann and his wife and daughter at bedside. Daughter speaks English very well but his wife only speaks [place] Interpretor service did not have anyone to translate this language so daughter was used as Veterinary surgeon as we had no other options available to [place] at this time. We discussed his very poor prognosis with his metastatic NSCLC. We discussed that he is so very weak and malnourished that no further treatment is available for his cancer (confirmed with Dr. Earlie Server yesterday). We discussed that what we can do is keep him comfortable and allow for a peaceful death. They are open to hospice. Wife and children work full time and are unable to provide care at home for him even with the support of hospice. We discussed hospice facility and they are open to this option - they wanted to make sure that this was not like a nursing home. I discussed that hospice will be giving him what he needs to keep him comfortable but will not be pursuing lab work, diagnostic testing, antibiotics, IV fluids, etc. Family seems to understand and agrees with comfort approach. Son will be here tomorrow morning and wife prefers to deliver this news to him in person. They agree to have hospice speak with them and make sure there is a bed available for transfer after they speak with son - I agree to meet with them again tomorrow at 0800 am. Prognosis is very poor with severe malnutrition and weight loss, they say he sleeps at least 70% of the time, and ongoing abdominal pain - I suspect he has <3 weeks and I communicated this to his family.   Vinie Sill, NP Palliative Medicine Team Pager # [phone] (M-F 8a-5p) Team Phone # [phone] (Nights/Weekends)

## 2014-01-20 NOTE — Consult Note (Signed)
[facility] Liaison: Received referral from [place] and request from PMT NP to contact family for interest in [facility]. Chart reviewed and spoke briefly with daughter by phone. Unfortunately she was unable to talk at work and plans to call me this evening when she gets off. Will update CSW and PMT following conversation. Thank you. Erling Conte LCSW [phone]

## 2014-01-20 NOTE — Progress Notes (Signed)
PT Cancellation Note  Patient Details Name: [name] MRN: [id number] DOB: 12/16/1940   Cancelled Treatment:    Reason Eval/Treat Not Completed: Medical issues which prohibited therapyAwait GOC .   Claretha Cooper 01/20/2014, 12:51 PM

## 2014-01-20 NOTE — Progress Notes (Signed)
[facility] PROGRESS NOTE  [name] TKP:[id number] DOB: 1941-06-08 DOA: 01/18/2014 PCP: Lorretta Harp, MD  Assessment/Plan: Adult FTT -Related to his metastatic NSCLC. -No appetite, also has pain in abdomen while eating. -Has been seen by ST with recommendations for a dysphagia III diet. -Discussed with Dr. Earlie Server, and we are in agreement with a a palliative care evaluation. -Has been seen by palliative care and plan is for residential hospice when bed becomes available.  Severe Protein Caloric Malnutrition -Ensure TID. -Appreciate dietitian input.  Stage IV Lung Cancer -DNR/full comfort care. -Follows with Dr. Julien Nordmann in the OP setting.  Code Status: DNR Family Communication: Wife  at bedside.  Disposition Plan: Residential hospice  Consultants:  Palliative Care  Oncology   Antibiotics:  None   Subjective: Feels week, fatigued.  Objective: Filed Vitals:   01/19/14 1306 01/19/14 2106 01/20/14 0549 01/20/14 1241  BP: 118/56 120/69 92/53 110/60  Pulse: 78 100 93   Temp: 98.1 F (36.7 C) 98.5 F (36.9 C) 98.3 F (36.8 C)   TempSrc: Oral Oral Oral   Resp: 15 18 16    Height:      Weight:      SpO2: 90% 90% 90%    No intake or output data in the 24 hours ending 01/20/14 1314 Filed Weights   01/18/14 1418  Weight: 46.04 kg (101 lb 8 oz)    Exam:   General:  AA Ox3  Cardiovascular: RRR  Respiratory: CTA B  Abdomen: S/ND/+BS  Extremities: no C/C/E   Neurologic:  Moves all 4 spontaneously  Data Reviewed: Basic Metabolic Panel:  Recent Labs Lab 01/18/14 0821 01/19/14 0447  NA 136* 138  K 4.5 5.1  CL 96 101  CO2 28 26  GLUCOSE 85 93  BUN 57* 58*  CREATININE 1.30 1.43*  CALCIUM 12.8* 12.2*  MG 2.2  --   PHOS 2.6  --    Liver Function Tests:  Recent Labs Lab 01/18/14 0821  AST 117*  ALT 66*  ALKPHOS 587*  BILITOT 1.5*  PROT 7.6  ALBUMIN 2.4*    Recent Labs Lab 01/18/14 0821  LIPASE 19   No results found  for this basename: AMMONIA,  in the last 168 hours CBC:  Recent Labs Lab 01/18/14 0821 01/19/14 0447  WBC 20.7* 17.1*  NEUTROABS 18.1*  --   HGB 11.1* 9.4*  HCT 34.0* 29.6*  MCV 78.0 77.9*  PLT 270 238   Cardiac Enzymes: No results found for this basename: CKTOTAL, CKMB, CKMBINDEX, TROPONINI,  in the last 168 hours BNP (last 3 results) No results found for this basename: PROBNP,  in the last 8760 hours CBG: No results found for this basename: GLUCAP,  in the last 168 hours  Recent Results (from the past 240 hour(s))  URINE CULTURE     Status: None   Collection Time    01/18/14 12:41 PM      Result Value Ref Range Status   Specimen Description URINE, CATHETERIZED   Final   Special Requests NONE   Final   Culture  Setup Time     Final   Value: 01/18/2014 20:13     Performed at [facility]     Final   Value: NO GROWTH     Performed at [facility]   Culture     Final   Value: NO GROWTH     Performed at [facility]   Report Status 01/19/2014 FINAL  Final     Studies: No results found.  Scheduled Meds: . antiseptic oral rinse  15 mL Mouth Rinse q12n4p  . carvedilol  3.125 mg Oral BID WC  . chlorhexidine  15 mL Mouth Rinse BID  . docusate sodium  100 mg Oral BID  . feeding supplement (ENSURE COMPLETE)  237 mL Oral TID BM  . heparin  5,000 Units Subcutaneous 3 times per day  . isosorbide dinitrate  10 mg Oral 3 times per day  . multivitamin with minerals  1 tablet Oral Daily  . polyethylene glycol  17 g Oral Daily  . ramipril  2.5 mg Oral Q1200   Continuous Infusions: . dextrose 5 % and 0.9% NaCl 75 mL/hr (01/19/14 1633)    Principal Problem:   Failure to thrive in adult Active Problems:   Lung cancer   Protein-calorie malnutrition, severe   Chronic combined systolic and diastolic CHF, NYHA class 1   Dysphagia, unspecified(787.20)    Time spent: 25 minutes. Greater than 50% of this time was spent in direct contact  with the patient coordinating care.    Lelon Frohlich  [facility] Pager [phone]  If 7PM-7AM, please contact night-coverage at [email or web address], password [facility] 01/20/2014, 1:14 PM  LOS: 2 days

## 2014-01-20 NOTE — Progress Notes (Signed)
Clinical Social Work Department BRIEF PSYCHOSOCIAL ASSESSMENT 01/20/2014  Patient:  [name],[name]     Account Number:  [id number]     Admit date:  01/18/2014  Clinical Social Worker:  Earlie Server  Date/Time:  01/20/2014 11:20 AM  Referred by:  Physician  Date Referred:  01/20/2014 Referred for  Residential hospice placement   Other Referral:   Interview type:  Family Other interview type:    PSYCHOSOCIAL DATA Living Status:  FAMILY Admitted from facility:   Level of care:   Primary support name:  Serl Primary support relationship to patient:  CHILD, ADULT Degree of support available:   Strong    CURRENT CONCERNS Current Concerns  Post-Acute Placement   Other Concerns:    SOCIAL WORK ASSESSMENT / PLAN CSW received referral in order to offer hospice choice. CSW reviewed chart and met with patient, wife, and granddtrs at bedside. CSW introduced myself and explained role.    Patient and wife speak [place] but dtr speaks [place]. CSW attempted to use interpreter phone but they only had [place] interpreter which wife could not understand. Wife instructed granddtr to ask CSW to speak with dtr. CSW spoke with dtr via phone who reports she is aware of plans and all family is agreeable to hospice placement. Family reports they live very close to [facility] and would prefer placement at their facility so that family can visit often.  CSW explained hospice process and left hospice list in room. CSW encouraged family to review other options in case [facility] does not have available space. [place] and [facility] is about equal distance from family. Dtr reports she will discuss plans with family and have alternative option in case [facility] is unable to accept.    CSW gave [facility] representative Harmon Pier) referral and will await to see if they can accept.   Assessment/plan status:  Psychosocial Support/Ongoing Assessment of Needs Other assessment/ plan:    Information/referral to community resources:   Hospice choice    PATIENT'S/FAMILY'S RESPONSE TO PLAN OF CARE: Patient sleeping and not engaged. Wife tearful and desires dtr to help with decision making. Dtr engaged and appreciative of information. Dtr does not feel comfortable determining alternative option until she has discussed options with family but reports understanding. Dtr reports they want the best care for patient and thanked hospital for support.       [place], Esmeralda [phone]

## 2014-01-21 DIAGNOSIS — C3400 Malignant neoplasm of unspecified main bronchus: Secondary | ICD-10-CM

## 2014-01-21 DIAGNOSIS — Z515 Encounter for palliative care: Secondary | ICD-10-CM

## 2014-01-21 MED ORDER — LORAZEPAM 1 MG PO TABS: 1.0000 mg | ORAL_TABLET | ORAL | Status: AC | PRN

## 2014-01-21 MED ORDER — MORPHINE SULFATE (CONCENTRATE) 10 MG /0.5 ML PO SOLN: 5.0000 mg | ORAL | Status: AC | PRN

## 2014-01-21 MED ORDER — SODIUM CHLORIDE 0.9 % IJ SOLN: 10.0000 mL | Freq: Two times a day (BID) | INTRAMUSCULAR | Status: DC

## 2014-01-21 MED ORDER — SODIUM CHLORIDE 0.9 % IJ SOLN: 10.0000 mL | INTRAMUSCULAR | Status: DC | PRN

## 2014-01-21 MED ORDER — BISACODYL 10 MG RE SUPP: 10.0000 mg | Freq: Every day | RECTAL | Status: DC | PRN

## 2014-01-21 NOTE — Progress Notes (Signed)
Clinical Social Work  CSW spoke with dtr Mosie Lukes) who reports that patient's dtr Arboriculturist) is not highly involved and all family is requesting residential hospice because they do not feel they can manage patient's care at home. Dtr and son agreeable to come to the hospital to meet with CSW to discuss plans. CSW updated [employer] [facility] of family's decision. CSW will continue to follow.  [place], [place] [phone]

## 2014-01-21 NOTE — Progress Notes (Signed)
Progress Note from the Palliative Medicine Team at [facility]  Subjective: Wife and son at bedside. [name] is resting comfortably. I also spoke with daughter via telephone in room. They all agree that [facility] is the best option as they discussed this option last night as a family. I did see notes from [place] that family seemed interested in home with hospice - but as we discussed this morning they are unable to provide 24/7 care as all the family works full time jobs. The wife's only concern is that she will be able to be with him overnight - I confirmed that this will not be a problem. He does awaken at times and drink and he had a few bites of food the other day. Goal is for comfort. Awaiting call back to get ICD deactivated - order placed. Prognosis poor <3 weeks.    Objective: Allergies  Allergen Reactions  . Oxycodone Nausea And Vomiting  . Tramadol Other (See Comments)    Felt light he was going to pass out   Scheduled Meds: . antiseptic oral rinse  15 mL Mouth Rinse q12n4p  . carvedilol  3.125 mg Oral BID WC  . chlorhexidine  15 mL Mouth Rinse BID  . docusate sodium  100 mg Oral BID  . feeding supplement (ENSURE COMPLETE)  237 mL Oral TID BM  . heparin  5,000 Units Subcutaneous 3 times per day  . isosorbide dinitrate  10 mg Oral 3 times per day  . multivitamin with minerals  1 tablet Oral Daily  . polyethylene glycol  17 g Oral Daily  . ramipril  2.5 mg Oral Q1200   Continuous Infusions: . dextrose 5 % and 0.9% NaCl 75 mL/hr (01/20/14 1844)   PRN Meds:.lidocaine-prilocaine, LORazepam, morphine CONCENTRATE, ondansetron (ZOFRAN) IV, ondansetron  BP 99/54  Pulse 105  Temp(Src) 99 F (37.2 C) (Oral)  Resp 14  Ht 5\' 6"  (1.676 m)  Wt 46.04 kg (101 lb 8 oz)  BMI 16.39 kg/m2  SpO2 90%   PPS: 10%     Intake/Output Summary (Last 24 hours) at 01/21/14 0845 Last data filed at 01/20/14 1913  Gross per 24 hour  Intake    120 ml  Output    350 ml  Net   -230 ml        Physical Exam:  General: Lethargic, frail, cachectic, ill appearing, NAD  HEENT: Severe muscle wasting, no JVD, moist oral mucosa  Chest: CTA throughout, no labored breathing, symmetric  CVS: RRR, S1 S2  Abdomen: Soft, ND, tender to palpation, hypoactive BS  Ext: MAE, no edema, warm to touch  Neuro: Lethargic, follows some commands, unable to assess orientation d/t lethargy    Labs: CBC    Component Value Date/Time   WBC 17.1* 01/19/2014 0447   WBC 11.6* 12/29/2013 1012   WBC 6.3 11/01/2013 1258   RBC 3.80* 01/19/2014 0447   RBC 3.69* 12/29/2013 1012   RBC 3.63* 11/01/2013 1258   HGB 9.4* 01/19/2014 0447   HGB 8.9* 12/29/2013 1012   HGB 9.0* 11/01/2013 1258   HCT 29.6* 01/19/2014 0447   HCT 28.3* 12/29/2013 1012   HCT 29.7* 11/01/2013 1258   PLT 238 01/19/2014 0447   PLT 266 12/29/2013 1012   MCV 77.9* 01/19/2014 0447   MCV 76.7* 12/29/2013 1012   MCV 81.7 11/01/2013 1258   MCH 24.7* 01/19/2014 0447   MCH 24.1* 12/29/2013 1012   MCH 24.8* 11/01/2013 1258   MCHC 31.8 01/19/2014 0447  MCHC 31.4* 12/29/2013 1012   MCHC 30.3* 11/01/2013 1258   RDW 19.1* 01/19/2014 0447   RDW 16.4* 12/29/2013 1012   LYMPHSABS 0.9 01/18/2014 0821   LYMPHSABS 1.0 12/29/2013 1012   MONOABS 1.6* 01/18/2014 0821   MONOABS 1.2* 12/29/2013 1012   EOSABS 0.0 01/18/2014 0821   EOSABS 0.1 12/29/2013 1012   BASOSABS 0.0 01/18/2014 0821   BASOSABS 0.0 12/29/2013 1012    BMET    Component Value Date/Time   NA 138 01/19/2014 0447   NA 136 12/29/2013 1012   K 5.1 01/19/2014 0447   K 4.3 12/29/2013 1012   CL 101 01/19/2014 0447   CO2 26 01/19/2014 0447   CO2 24 12/29/2013 1012   GLUCOSE 93 01/19/2014 0447   GLUCOSE 112 12/29/2013 1012   BUN 58* 01/19/2014 0447   BUN 38.5* 12/29/2013 1012   CREATININE 1.43* 01/19/2014 0447   CREATININE 1.9* 12/29/2013 1012   CALCIUM 12.2* 01/19/2014 0447   CALCIUM 9.8 12/29/2013 1012   GFRNONAA 47* 01/19/2014 0447   GFRAA 54* 01/19/2014 0447    CMP     Component Value Date/Time   NA 138 01/19/2014 0447    NA 136 12/29/2013 1012   K 5.1 01/19/2014 0447   K 4.3 12/29/2013 1012   CL 101 01/19/2014 0447   CO2 26 01/19/2014 0447   CO2 24 12/29/2013 1012   GLUCOSE 93 01/19/2014 0447   GLUCOSE 112 12/29/2013 1012   BUN 58* 01/19/2014 0447   BUN 38.5* 12/29/2013 1012   CREATININE 1.43* 01/19/2014 0447   CREATININE 1.9* 12/29/2013 1012   CALCIUM 12.2* 01/19/2014 0447   CALCIUM 9.8 12/29/2013 1012   PROT 7.6 01/18/2014 0821   PROT 7.6 12/29/2013 1012   ALBUMIN 2.4* 01/18/2014 0821   ALBUMIN 2.5* 12/29/2013 1012   AST 117* 01/18/2014 0821   AST 63* 12/29/2013 1012   ALT 66* 01/18/2014 0821   ALT 34 12/29/2013 1012   ALKPHOS 587* 01/18/2014 0821   ALKPHOS 436* 12/29/2013 1012   BILITOT 1.5* 01/18/2014 0821   BILITOT 0.92 12/29/2013 1012   GFRNONAA 47* 01/19/2014 0447   GFRAA 54* 01/19/2014 0447     Assessment and Plan: 1. Code Status: DNR 2. Symptom Control: Pain: Roxanol 5 mg every 1 hour as needed.  1. Anxiety/Sleep: Ativan 1 mg po every 4 hours prn.  2. Bowel Regimen: Colace and miralax scheduled. Dulcolax supp daily prn.  3. Nausea/Vomiting: Ondansetron prn.  3. Psycho/Social: Emotional support provided to family at bedside.  4. Disposition: Hopeful for residential hospice.     Time In Time Out Total Time Spent with Patient Total Overall Time  0755 0815 85min 65min    Greater than 50%  of this time was spent counseling and coordinating care related to the above assessment and plan.  Vinie Sill, NP Palliative Medicine Team Pager # [phone] (M-F 8a-5p) Team Phone # [phone] (Nights/Weekends)

## 2014-01-21 NOTE — Progress Notes (Signed)
Clinical Social Work  [employer] met with family and completed paperwork. Patient's wife, dtr, and son all at bedside and reports they are happy that patient can go to [facility] so that they can visit often. [facility] agreeable to accept patient on 01/22/14. DNR form signed and placed on chart. CSW will leave handoff for weekend CSW to assist with DC plans. MD, RN, CM all aware of plans.  [place],  [phone]

## 2014-01-21 NOTE — Progress Notes (Signed)
Physical Therapy Discharge Patient Details Name: [name] MRN: [id number] DOB: 04-12-1941 Today's Date: 01/21/2014 Time:  -     Patient discharged from PT services secondary to medical decline - will need to re-order PT to resume therapy services.    GP     Marcelino Freestone PT [phone]  01/21/2014, 7:52 AM

## 2014-01-21 NOTE — Consult Note (Signed)
[facility] Liaison: [place] room available for Mr. Errico tomorrow 01/22/2014. Family completed registration paperwork today. Dr. Orpah Melter to assume care per family request. Please fax discharge summary to [phone] and have RN call report to [phone]. Thank you. Erling Conte LCSW [phone]

## 2014-01-21 NOTE — Progress Notes (Signed)
Patient has order for implantable defibrillator to be off, tried to call Dr Tanna Furry office to get information about order was not communicated to Nurse from NP about order and whom needed to be contacted, spoke with Dr Jerilee Hoh the attending about order and if there was any information about this order she could give me, was inform that the distributor would need to be called and they would come and turn off the defibrillator, this order was new to this unit and if communication from NP to Nurse was given this could have created a learning task and not seem like time was unproductive

## 2014-01-21 NOTE — Discharge Summary (Signed)
Physician Discharge Summary  [name] WJX:[id number] DOB: 1940/11/15 DOA: 01/18/2014  PCP: Lorretta Harp, MD  Admit date: 01/18/2014 Discharge date: 01/21/2014  Time spent: 45 minutes  Recommendations for Outpatient Follow-up:  -Will be discharged to [facility] on 7/4 for hospice care.   Discharge Diagnoses:  Principal Problem:   Failure to thrive in adult Active Problems:   Lung cancer   Protein-calorie malnutrition, severe   Chronic combined systolic and diastolic CHF, NYHA class 1   Dysphagia, unspecified(787.20)   Palliative care encounter   Discharge Condition: Declining  Filed Weights   01/18/14 1418  Weight: 46.04 kg (101 lb 8 oz)    History of present illness:  [name] is a 73 y.o. male  73 year old with history of metastatic lung cancer, combined systolic and diastolic heart failure, and COPD who presented to the ED with complaints of progressive weakness and poor oral intake. Much of the history is obtained from ED physician and daughter at bedside as patient does not speaking much well reportedly. Daughter reports the patient has not been eating all that well in the last several days and has progressively gotten weaker. Given the persistence in symptoms and that it is getting worse patient was brought to the ED for further evaluation. No reported fevers. Hospitalist admission was requested.   Hospital Course:   Adult FTT  -Related to his metastatic NSCLC.  -No appetite, also has pain in abdomen while eating.  -Has been seen by ST with recommendations for a dysphagia III diet.  -Discussed with Dr. Earlie Server, and we are in agreement with a a palliative care evaluation.  -Has been seen by palliative care and plan is for residential hospice when bed becomes available.  -AICD has been turned off.  Severe Protein Caloric Malnutrition  -Ensure TID.  -Appreciate dietitian input.   Stage IV Lung Cancer  -DNR/full comfort care.  -Follows with Dr. Julien Nordmann in  the OP setting.   Procedures:  None   Consultations:  Palliative Care  Discharge Instructions  Discharge Instructions   Discontinue IV    Complete by:  As directed             Medication List    STOP taking these medications       carvedilol 6.25 MG tablet  Commonly known as:  COREG     isosorbide dinitrate 10 MG tablet  Commonly known as:  ISORDIL     lidocaine-prilocaine cream  Commonly known as:  EMLA     morphine 15 MG tablet  Commonly known as:  MSIR  Replaced by:  morphine CONCENTRATE 10 mg / 0.5 ml concentrated solution     ramipril 2.5 MG capsule  Commonly known as:  ALTACE      TAKE these medications       LORazepam 1 MG tablet  Commonly known as:  ATIVAN  Take 1 tablet (1 mg total) by mouth every 4 (four) hours as needed for anxiety or sleep.     morphine CONCENTRATE 10 mg / 0.5 ml concentrated solution  Take 0.25 mLs (5 mg total) by mouth every hour as needed for moderate pain or shortness of breath.     ondansetron 4 MG disintegrating tablet  Commonly known as:  ZOFRAN-ODT  Take 4 mg by mouth every 6 (six) hours as needed for nausea or vomiting.       Allergies  Allergen Reactions  . Oxycodone Nausea And Vomiting  . Tramadol Other (See Comments)  Felt light he was going to pass out      The results of significant diagnostics from this hospitalization (including imaging, microbiology, ancillary and laboratory) are listed below for reference.    Significant Diagnostic Studies: Ct Abdomen Pelvis Wo Contrast  12/27/2013   CLINICAL DATA:  Non-small cell lung cancer  EXAM: CT CHEST, ABDOMEN AND PELVIS WITHOUT CONTRAST  TECHNIQUE: Multidetector CT imaging of the chest, abdomen and pelvis was performed following the standard protocol without IV contrast.  COMPARISON:  None.  FINDINGS: CT CHEST FINDINGS  There are changes of external beam radiation again noted in both lungs. Index lesion in the left lung apex measures 3.3 cm, image 13/ series  4. Previously 3.2 cm. Stable scar like density within the left base, image 54/series 4.  The heart size is mildly enlarged. There is a left chest wall pacer device with leads in the coronary sinus, right ventricle and right atrium. No enlarged mediastinal or hilar lymph nodes identified. There is no axillary or supraclavicular adenopathy identified.  CT ABDOMEN AND PELVIS FINDINGS  Extensive multi focal areas of liver metastasis are again identified. There are multiple new areas of low attenuation within the liver. Note: Today's exam was performed without IV contrast material. Index lesion within the lateral segment of left hepatic lobe measures 2.3 cm, image 59/series 2. This is compared with 1.4 cm previously. Posterior right hepatic lobe lesion measures 1.8 cm, image 62/ series 2. Previously this measured 5 mm. New lesion in caudate lobe of liver measures 1.6 cm, image 68/series 2 The gallbladder appears normal. Multiple calcifications within the pancreatic parenchyma are identified. The spleen is unremarkable.  Normal appearance of the adrenal glands. The kidneys are both unremarkable. Urinary bladder appears normal. The prostate gland appears enlarged and has mass effect upon the bladder base.  There is calcified atherosclerotic disease affecting the abdominal aorta. Aneurysm stress set aortic aneurysm measures 3.9 cm, image 77/series 2. This is unchanged from previous exam. No enlarged upper abdominal lymph nodes. There is no pelvic or inguinal adenopathy. No free fluid or fluid collections within the abdomen or pelvis.  Review of the visualized bony structures again demonstrates a sclerotic lesion within the left iliac wing measuring 1.7 cm, image 100/series 2. This is unchanged from previous exam. Similar appearance of proximal left femur sclerotic lesion, image 125/series 2. No evidence for sclerotic bone metastasis.  IMPRESSION: 1. Stable appearance of left apical lung lesion. 2. Multifocal liver  metastasis. Compared with previous exam these appear increased in size and multiplicity, worrisome for progression of disease. In contrast to the previous exam, today's study was performed without IV contrast material. Some of the low-attenuation lesions identified on today's exam may reflect areas of treated tumor. 3. Stable sclerotic bone metastasis. 4. Chronic calcific pancreatitis. 5. Stable infrarenal abdominal aortic aneurysm.   Electronically Signed   By: Kerby Moors M.D.   On: 12/27/2013 16:16   Ct Chest Wo Contrast  12/27/2013   CLINICAL DATA:  Non-small cell lung cancer  EXAM: CT CHEST, ABDOMEN AND PELVIS WITHOUT CONTRAST  TECHNIQUE: Multidetector CT imaging of the chest, abdomen and pelvis was performed following the standard protocol without IV contrast.  COMPARISON:  None.  FINDINGS: CT CHEST FINDINGS  There are changes of external beam radiation again noted in both lungs. Index lesion in the left lung apex measures 3.3 cm, image 13/ series 4. Previously 3.2 cm. Stable scar like density within the left base, image 54/series 4.  The heart size is  mildly enlarged. There is a left chest wall pacer device with leads in the coronary sinus, right ventricle and right atrium. No enlarged mediastinal or hilar lymph nodes identified. There is no axillary or supraclavicular adenopathy identified.  CT ABDOMEN AND PELVIS FINDINGS  Extensive multi focal areas of liver metastasis are again identified. There are multiple new areas of low attenuation within the liver. Note: Today's exam was performed without IV contrast material. Index lesion within the lateral segment of left hepatic lobe measures 2.3 cm, image 59/series 2. This is compared with 1.4 cm previously. Posterior right hepatic lobe lesion measures 1.8 cm, image 62/ series 2. Previously this measured 5 mm. New lesion in caudate lobe of liver measures 1.6 cm, image 68/series 2 The gallbladder appears normal. Multiple calcifications within the pancreatic  parenchyma are identified. The spleen is unremarkable.  Normal appearance of the adrenal glands. The kidneys are both unremarkable. Urinary bladder appears normal. The prostate gland appears enlarged and has mass effect upon the bladder base.  There is calcified atherosclerotic disease affecting the abdominal aorta. Aneurysm stress set aortic aneurysm measures 3.9 cm, image 77/series 2. This is unchanged from previous exam. No enlarged upper abdominal lymph nodes. There is no pelvic or inguinal adenopathy. No free fluid or fluid collections within the abdomen or pelvis.  Review of the visualized bony structures again demonstrates a sclerotic lesion within the left iliac wing measuring 1.7 cm, image 100/series 2. This is unchanged from previous exam. Similar appearance of proximal left femur sclerotic lesion, image 125/series 2. No evidence for sclerotic bone metastasis.  IMPRESSION: 1. Stable appearance of left apical lung lesion. 2. Multifocal liver metastasis. Compared with previous exam these appear increased in size and multiplicity, worrisome for progression of disease. In contrast to the previous exam, today's study was performed without IV contrast material. Some of the low-attenuation lesions identified on today's exam may reflect areas of treated tumor. 3. Stable sclerotic bone metastasis. 4. Chronic calcific pancreatitis. 5. Stable infrarenal abdominal aortic aneurysm.   Electronically Signed   By: Kerby Moors M.D.   On: 12/27/2013 16:16   US Abdomen Complete  01/18/2014   CLINICAL DATA:  Abdominal pain; history of metastatic disease to the liver, renal cysts, and abdominal aortic aneurysm  EXAM: ULTRASOUND ABDOMEN COMPLETE  COMPARISON:  CT scan of the abdomen and pelvis dated January 07, 2014.  FINDINGS: Gallbladder:  There is sludge within the gallbladder. Discrete stones are not demonstrated. The gallbladder wall thickness is top normal at 4.3 mm. There is no positive sonographic Murphy's sign.   Common bile duct:  Diameter: 3 mm  Liver:  The echotexture of the liver is heterogeneous diffusely with multiple masses of varying sizes demonstrated consistent with known metastatic disease. There is no intrahepatic ductal dilation.  IVC:  Limited visibility due to bowel gas.  Pancreas:  Limited visibility due to bowel gas.  Spleen:  The spleen is not enlarged.  Evaluation is limited due to bowel gas.  Right Kidney:  Length: 10.2 cm. Echogenicity within normal limits. No hydronephrosis visualized. A midpole cortical cyst measures 1.2 cm in greatest dimension.  Left Kidney:  Length: 9 cm. Echogenicity within normal limits. No hydronephrosis a visualized. There is a lower pole cyst measuring 1.7 cm in greatest dimension  Abdominal aorta:  There is a known infrarenal abdominal aortic aneurysm. It measures 3.4 cm in maximal diameter and is 5.6 cm in length. This has not increased in size since the previous CT study.  Other findings:  None.  IMPRESSION: 1. There is sludge within the gallbladder without evidence of acute cholecystitis. 2. Widespread hepatic metastatic disease is again demonstrated 3. No acute abnormality of the kidneys nor spleen or visualized portions of the pancreas are demonstrated. 4. There is a known infrarenal abdominal aortic aneurysm.   Electronically Signed   By: David  Martinique   On: 01/18/2014 09:14   Ct Abdomen Pelvis W Contrast  01/07/2014   CLINICAL DATA:  Metastatic lung cancer to liver and bone post chemotherapy, BILATERAL lower abdominal pain and vomiting which began yesterday, additional past history hypertension, coronary artery disease post MI and coronary PTCA with ischemic cardiomyopathy, COPD, former smoker, stroke  EXAM: CT ABDOMEN AND PELVIS WITH CONTRAST  TECHNIQUE: Multidetector CT imaging of the abdomen and pelvis was performed using the standard protocol following bolus administration of intravenous contrast. Sagittal and coronal MPR images reconstructed from axial data set.   CONTRAST:  19mL OMNIPAQUE IOHEXOL 300 MG/ML SOLN IV. Dilute oral contrast.  COMPARISON:  12/27/2013, 10/22/2013  FINDINGS: Minimal RIGHT pleural effusion and bibasilar atelectasis.  Pacemaker/AICD leads RIGHT atrium, RIGHT ventricle and coronary sinus.  Extensive atherosclerotic calcification aorta and coronary arteries with aneurysmal dilatation of the infrarenal abdominal aorta up to a greatest size of 3.8 cm AP x 4.2 cm transverse x ~5 cm length.  Numerous low-attenuation foci with peripheral enhancement within liver compatible with hepatic metastases, increased in size and number since earlier study of 10/22/2013.  BILATERAL renal cortical atrophy and cortical cysts.  Hepatomegaly with displacement of aorta, duodenum, stomach and pancreas to the LEFT.  Spleen unremarkable.  Scattered pancreatic calcifications.  Unremarkable bladder and ureters with minimal prostatic enlargement.  Stomach and bowel loops normal appearance.  Small amount of nonspecific free fluid perihepatic.  No adenopathy, bowel wall thickening, or free air.  Bones demineralized with multifactorial AP spinal stenosis at L2-L3 and minimal retrolisthesis at L3-L4 and L4-L5.  Question sclerotic metastasis of the posterior RIGHT tenth rib and LEFT femoral neck.  IMPRESSION: Increased hepatic metastatic disease with hepatomegaly and displacement of upper abdominal organs to the LEFT.  Minimal nonspecific RIGHT pleural effusion and perihepatic ascites.  Chronic calcific pancreatitis.  Renal cysts  Question osseous metastases of posterior RIGHT tenth rib and LEFT femoral neck.  Abdominal aortic aneurysm 3.8 x 4.2 cm in axial dimensions and ~5 cm length.   Electronically Signed   By: Lavonia Dana M.D.   On: 01/07/2014 13:40   Dg Abd Acute W/chest  01/18/2014   CLINICAL DATA:  LUNG CANCER WEAKNESS FATIGUE CONSTIPATION ABDOMINAL PAIN  EXAM: ACUTE ABDOMEN SERIES (ABDOMEN 2 VIEW & CHEST 1 VIEW)  COMPARISON:  Abdominal series 413 2015  FINDINGS: Lungs  are clear. Cardiac silhouette within normal limits. Aorta is tortuous and mildly ectatic containing atherosclerotic calcifications. Stable right porta catheter and left AICD. No acute osseus abnormalities.  Air within nondilated loops of bowel. Residual contrast in the distal sigmoid colon. Stable pancreatic calcifications consistent with chronic pancreatitis left upper quadrant. No acute osseous abnormalities.  IMPRESSION: Negative abdominal radiographs.  No acute cardiopulmonary disease.   Electronically Signed   By: Margaree Mackintosh M.D.   On: 01/18/2014 09:21    Microbiology: Recent Results (from the past 240 hour(s))  URINE CULTURE     Status: None   Collection Time    01/18/14 12:41 PM      Result Value Ref Range Status   Specimen Description URINE, CATHETERIZED   Final   Special Requests NONE   Final  Culture  Setup Time     Final   Value: 01/18/2014 20:13     Performed at [place]     Final   Value: NO GROWTH     Performed at [facility]   Culture     Final   Value: NO GROWTH     Performed at [facility]   Report Status 01/19/2014 FINAL   Final     Labs: Basic Metabolic Panel:  Recent Labs Lab 01/18/14 0821 01/19/14 0447  NA 136* 138  K 4.5 5.1  CL 96 101  CO2 28 26  GLUCOSE 85 93  BUN 57* 58*  CREATININE 1.30 1.43*  CALCIUM 12.8* 12.2*  MG 2.2  --   PHOS 2.6  --    Liver Function Tests:  Recent Labs Lab 01/18/14 0821  AST 117*  ALT 66*  ALKPHOS 587*  BILITOT 1.5*  PROT 7.6  ALBUMIN 2.4*    Recent Labs Lab 01/18/14 0821  LIPASE 19   No results found for this basename: AMMONIA,  in the last 168 hours CBC:  Recent Labs Lab 01/18/14 0821 01/19/14 0447  WBC 20.7* 17.1*  NEUTROABS 18.1*  --   HGB 11.1* 9.4*  HCT 34.0* 29.6*  MCV 78.0 77.9*  PLT 270 238   Cardiac Enzymes: No results found for this basename: CKTOTAL, CKMB, CKMBINDEX, TROPONINI,  in the last 168 hours BNP: BNP (last 3 results) No  results found for this basename: PROBNP,  in the last 8760 hours CBG: No results found for this basename: GLUCAP,  in the last 168 hours     Signed:  Lelon Frohlich  [facility] Pager: [phone] 01/21/2014, 12:57 PM

## 2014-01-22 MED ORDER — HEPARIN SOD (PORK) LOCK FLUSH 100 UNIT/ML IV SOLN
500.0000 [IU] | INTRAVENOUS | Status: AC | PRN
  Administered 2014-01-22: 500 [IU]

## 2014-01-22 NOTE — Progress Notes (Signed)
    Patient seen and examined. Wife and granddaughter at bedside. As arranged yesterday, will plan for transfer to [facility] today for end of life care. Please see DC summary dated 01/21/14 for details.  Domingo Mend, MD [facility] Pager: [phone]

## 2014-01-22 NOTE — Progress Notes (Signed)
Per MD, Pt ready for d/c.  Notified RN, family and facility.    Facility ready to receive Pt.  Arranged for transportation.  Bernita Raisin, [place] [employer] [phone]

## 2014-01-22 NOTE — Progress Notes (Signed)
Report given to Clarene Critchley, Therapist, sports at [facility], transportation has been called. Patient ready for discharge. J.Franceska Strahm, RN

## 2014-01-22 NOTE — Progress Notes (Signed)
RN attempted to use interpretative services, pt is non english speaking with family member at bedside that does not speak english either. Attempted to use vietnamese interpreter, pt too lethargic to answer questions and became agitated with phone communication, turning head away. Interpreter stated pt agreed to speaking vietnamese, however he was not answering RN assessment questions. Family member at bedside did not want to talk on phone either. Pt appears lethargic and in no distress. Physician note indicates patient will be discharged today to [facility], will follow up with MD and continue to monitor patient and provide support that is needed to patient and family. J.Nadyne Gariepy, RN

## 2014-01-23 NOTE — Consult Note (Signed)
I have reviewed and discussed the care of this patient in detail with the nurse practitioner including pertinent patient records, physical exam findings and data. I agree with details of this encounter.  

## 2014-01-26 ENCOUNTER — Ambulatory Visit: Payer: Medicare PPO | Admitting: Physician Assistant

## 2014-01-26 ENCOUNTER — Other Ambulatory Visit: Payer: Medicare PPO

## 2014-01-26 ENCOUNTER — Ambulatory Visit: Payer: Medicare PPO

## 2014-02-02 ENCOUNTER — Other Ambulatory Visit: Payer: Medicare PPO

## 2014-02-03 ENCOUNTER — Telehealth: Payer: Self-pay | Admitting: Medical Oncology

## 2014-02-03 NOTE — Telephone Encounter (Signed)
Pt missed lab appt . I tried 3 phone numbers and got no answer and  unable to leave message.

## 2014-02-19 DEATH — deceased

## 2014-03-25 ENCOUNTER — Telehealth: Payer: Self-pay | Admitting: Cardiovascular Disease

## 2014-03-25 NOTE — Telephone Encounter (Signed)
Pt expired on 02-12-14.

## 2014-03-29 ENCOUNTER — Ambulatory Visit: Payer: Medicare PPO | Admitting: Cardiovascular Disease

## 2014-06-23 IMAGING — CT CT ABD-PELV W/ CM
2 of 5 series · 15 of 46 positions shown, 17 images · IV contrast (OMNIPAQUE)
Comparison: CT CHEST W/CM dated 05/25/2013;

CLINICAL DATA: Non-small-cell lung cancer with liver and bone
metastasis diagnosed [DATE]. Ongoing chemotherapy and prior radiation
therapy. Cough.

EXAM:
CT CHEST, ABDOMEN, AND PELVIS WITH CONTRAST
TECHNIQUE: Multidetector CT imaging of the chest, abdomen and pelvis was
performed following the standard protocol during bolus
administration of intravenous contrast.
CONTRAST:  80mL OMNIPAQUE IOHEXOL 300 MG/ML  SOLN

[Series 2: cap with st · axial · 0.68mm/px · z∈[-536,+28]mm · 12 of 129 slices shown, 14 images]
[im 8/129  soft-tissue]
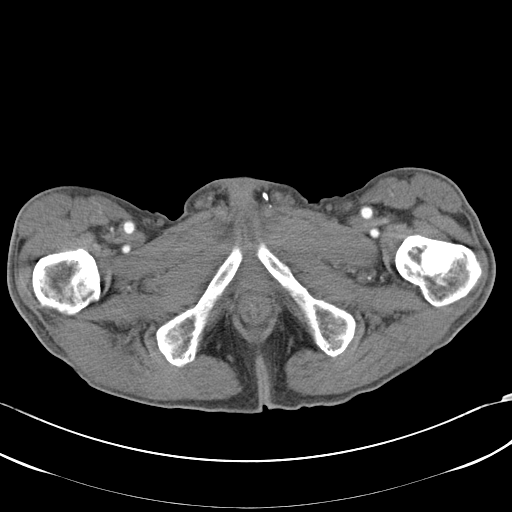
[im 8/129  bone]
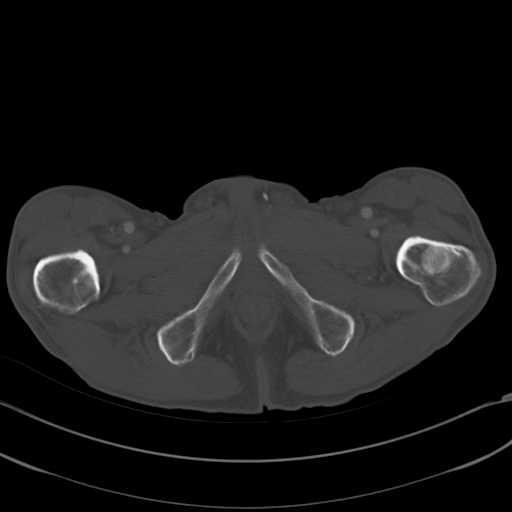
[im 22/129  soft-tissue]
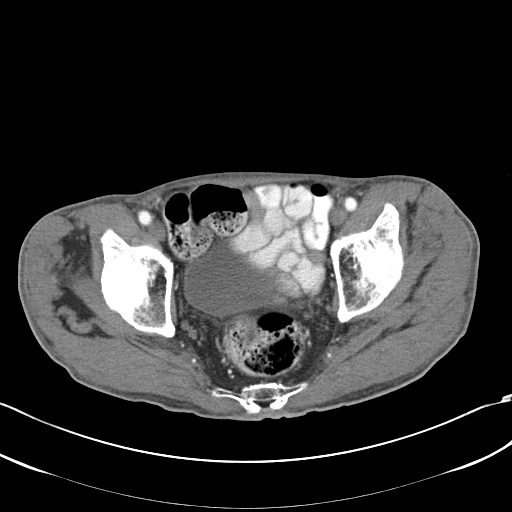
[im 29/129  soft-tissue]
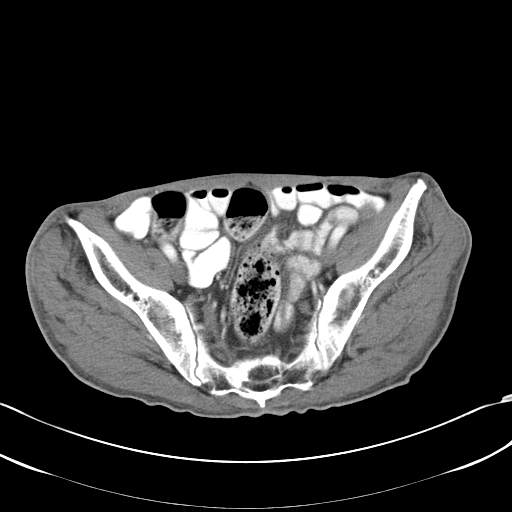
[im 36/129  soft-tissue]
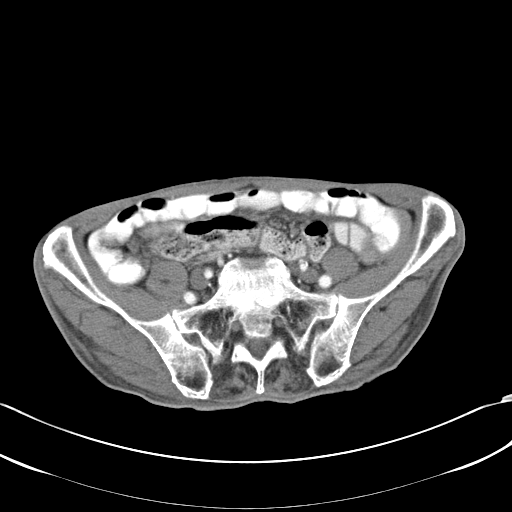
[im 50/129  soft-tissue]
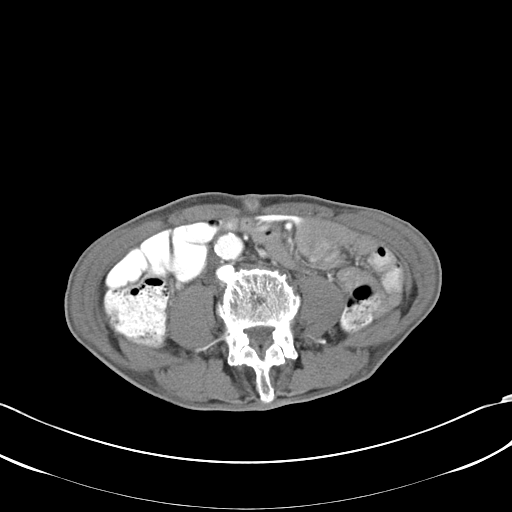
[im 57/129  soft-tissue]
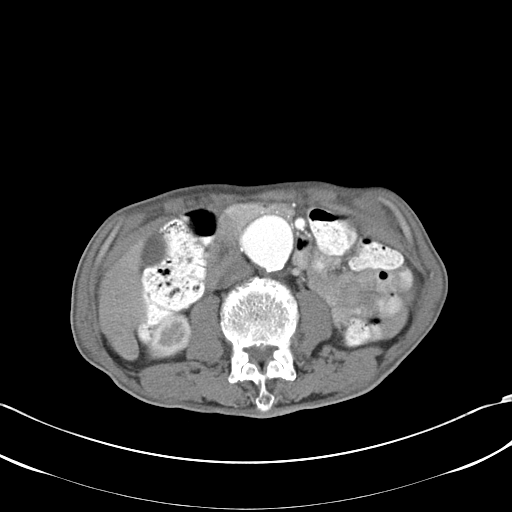
[im 72/129  soft-tissue]
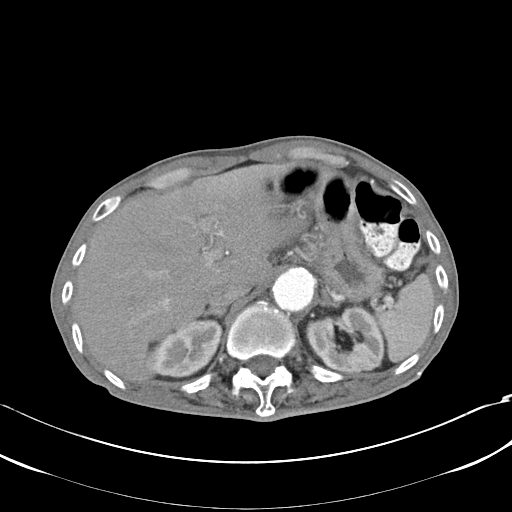
[im 79/129  soft-tissue]
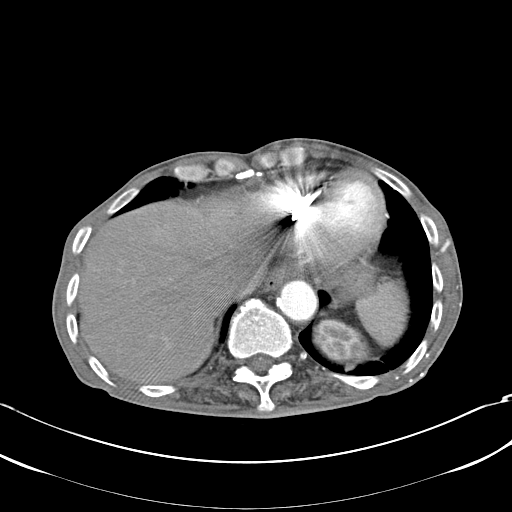
[im 93/129  soft-tissue]
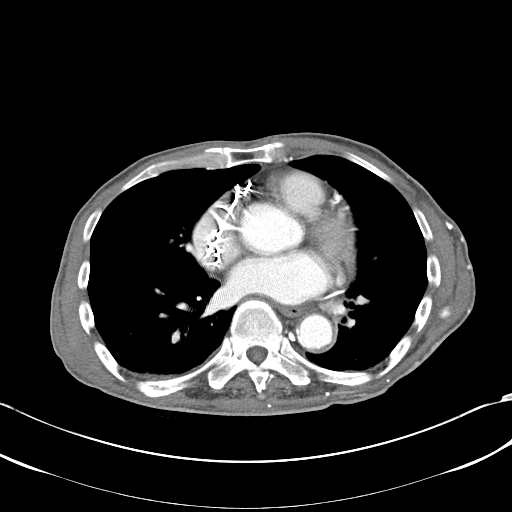
[im 93/129  bone]
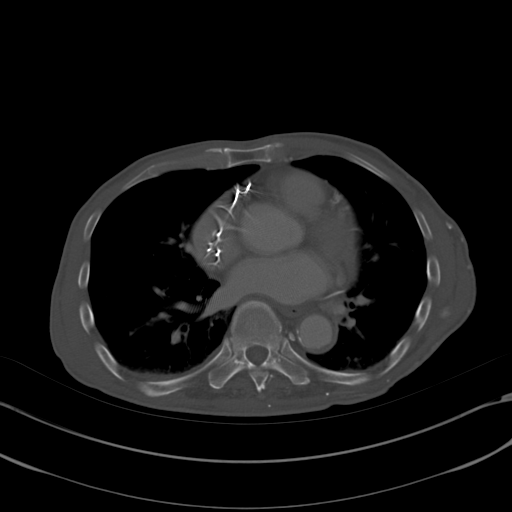
[im 100/129  soft-tissue]
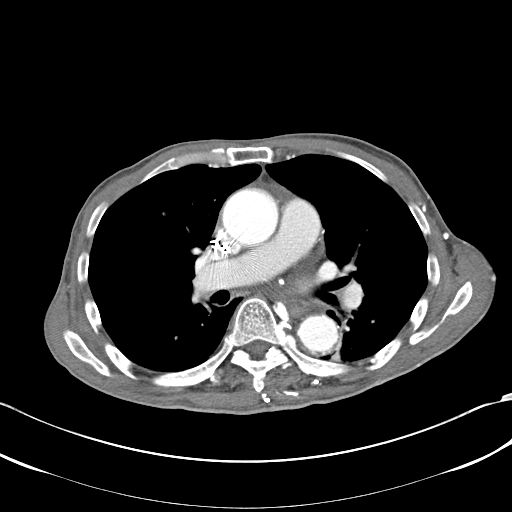
[im 107/129  soft-tissue]
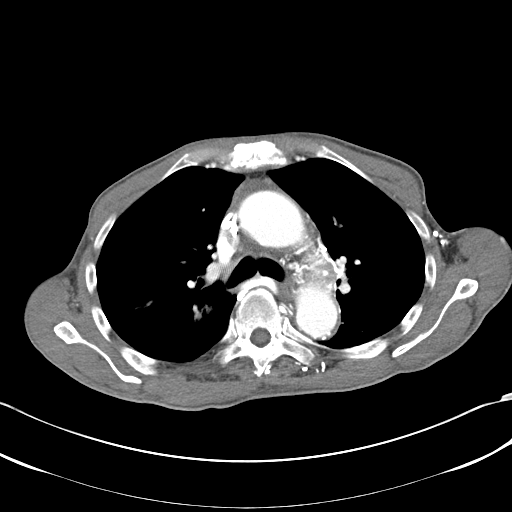
[im 121/129  soft-tissue]
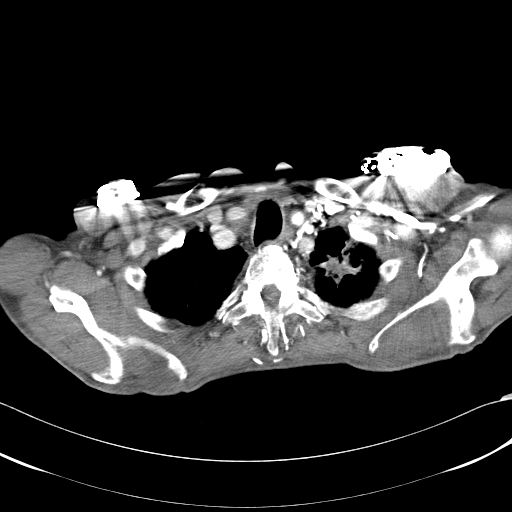

[Series 602: <mpr thick range> · coronal · 1.26mm/px · 3 of 63 slices shown]
[im 21/63  soft-tissue]
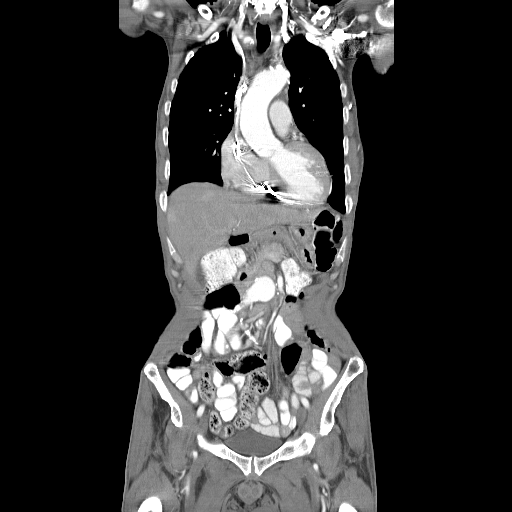
[im 28/63  soft-tissue]
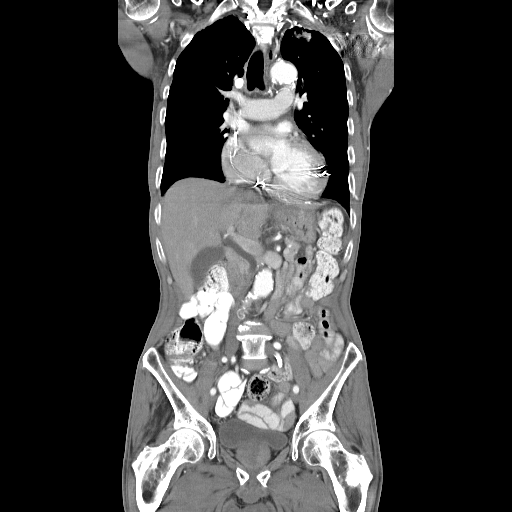
[im 35/63  soft-tissue]
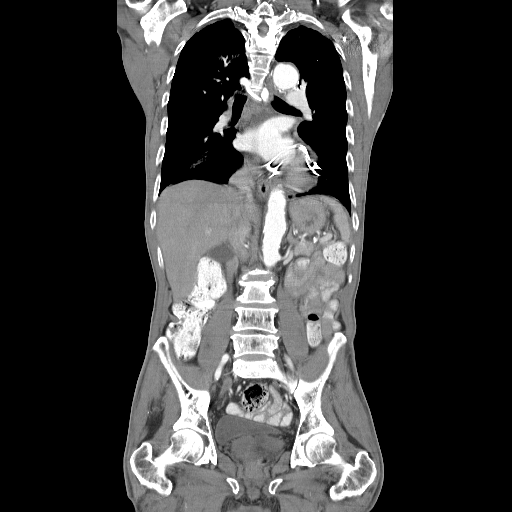

[15 of 46 positions shown; findings below may reference images not displayed]

DG CHEST 1 VIEW dated
03/31/2013; CT ABD/PELVIS W CM dated 05/25/2013; NM PET IMAGE INITIAL
(PI) SKULL BASE TO THIGH dated 01/19/2013
FINDINGS: CT CHEST FINDINGS

Lungs/Pleura: Mild motion degradation. New areas of left upper lobe
ground-glass and airspace opacity with suggestion of septal
thickening. Example in the posterior right upper lobe not 2.0 cm on
image 21. In the medial right apex at 2.6 cm on image 16.

Similar left apical spiculated opacity which is likely an area of
scarring. Measures maximally 2.9 x 1.9 cm on image 10 versus 2.6 x
2.0 cm on the prior.

Atelectasis or scarring at the left lung base. Less nodular than on
the prior.

No pleural fluid.

Heart/Mediastinum: Pacer/AICD device. A right-sided Port-A-Cath
which terminates at the low SVC.

Aortic and branch vessel atherosclerosis. Suspect central venous
insufficiency, likely at the left brachiocephalic vein. Resultant
prominence of the azygos vein with collateral vessels about the
posterior chest wall. This appearance is similar.

Cardiomegaly, with coronary artery atherosclerosis. No pericardial
effusion. No central pulmonary embolism, on this non-dedicated
study. No middle mediastinal or hilar adenopathy. Minimal
prevascular soft tissue thickening which measures 1.0 cm on image 23
versus 1.2 cm on the prior.

CT ABDOMEN AND PELVIS FINDINGS

Abdomen/Pelvis: Minimal motion degradation in the upper abdomen.
Probable perfusion anomaly in the right lobe of the liver again
identified on image 56/ series 2. Similar findings more inferiorly
in the right lobe on image 61/series 2.

Normal spleen, stomach. Pancreatic parenchymal calcifications.
Normal gallbladder, biliary tract, adrenal glands. Low-density
bilateral renal lesions with mild bilateral renal cortical thinning.
Cysts and too small to characterize foci. Aortic and branch vessel
atherosclerosis. Infrarenal abdominal aortic aneurysm which measures
3.8 x 3.7 cm on image 73. No retroperitoneal or retrocrural
adenopathy. Normal colon and terminal ileum. Normal small bowel
without abdominal ascites. No evidence of omental or peritoneal
disease. No pelvic adenopathy. Normal urinary bladder and prostate.
No significant free fluid.

Bones/Musculoskeletal: Left femoral neck sclerosis which measures
2.1 cm and is unchanged. Similar subtle left iliac sclerosis on
image 96. Similar sclerosis about the upper thoracic vertebral
bodies. This is likely vascular in etiology. Prominent disc bulge at
L4-5
IMPRESSION: CT CHEST IMPRESSION

1. No residual thoracic adenopathy. Further decrease in prevascular
nodal tissue.
2. Similar appearance of central venous insufficiency, likely in the
left brachiocephalic vein.
3. Development of patchy right upper lobe airspace and ground-glass
opacities with septal thickening. Considerations include atypical
infection, drug toxicity, and in the appropriate clinical setting,
radiation pneumonitis.

CT ABDOMEN AND PELVIS IMPRESSION

1. Similar appearance of foci of arterial hyper enhancement within
the liver. Likely perfusion anomalies. As detailed on the prior
exam, metastatic disease on prior PET was not readily apparent on
CT.
2. Otherwise, no evidence of extraosseous metastasis within the
abdomen/pelvis.
3. Similar abdominal aortic aneurysm.
4. Similar left femoral neck and iliac sclerosis.

## 2014-06-30 ENCOUNTER — Encounter (HOSPITAL_COMMUNITY): Payer: Self-pay | Admitting: Cardiology

## 2016-01-09 ENCOUNTER — Other Ambulatory Visit: Payer: Self-pay | Admitting: Nurse Practitioner
